# Patient Record
Sex: Female | Born: 1945 | Hispanic: No | Marital: Single | Smoking: Former smoker
Health system: Southern US, Community
[De-identification: ages and names within clinical notes are randomized; demographics above are authoritative.]

## PROBLEM LIST (undated history)

## (undated) DIAGNOSIS — E785 Hyperlipidemia, unspecified: Secondary | ICD-10-CM

## (undated) DIAGNOSIS — H04129 Dry eye syndrome of unspecified lacrimal gland: Secondary | ICD-10-CM

## (undated) DIAGNOSIS — M1990 Unspecified osteoarthritis, unspecified site: Secondary | ICD-10-CM

## (undated) DIAGNOSIS — R011 Cardiac murmur, unspecified: Secondary | ICD-10-CM

## (undated) DIAGNOSIS — T7840XA Allergy, unspecified, initial encounter: Secondary | ICD-10-CM

## (undated) DIAGNOSIS — M8580 Other specified disorders of bone density and structure, unspecified site: Secondary | ICD-10-CM

## (undated) DIAGNOSIS — I10 Essential (primary) hypertension: Secondary | ICD-10-CM

## (undated) DIAGNOSIS — B029 Zoster without complications: Secondary | ICD-10-CM

## (undated) HISTORY — PX: BREAST EXCISIONAL BIOPSY: SUR124

## (undated) HISTORY — DX: Hyperlipidemia, unspecified: E78.5

## (undated) HISTORY — DX: Unspecified osteoarthritis, unspecified site: M19.90

## (undated) HISTORY — DX: Dry eye syndrome of unspecified lacrimal gland: H04.129

## (undated) HISTORY — DX: Cardiac murmur, unspecified: R01.1

## (undated) HISTORY — DX: Allergy, unspecified, initial encounter: T78.40XA

## (undated) HISTORY — DX: Other specified disorders of bone density and structure, unspecified site: M85.80

## (undated) HISTORY — DX: Essential (primary) hypertension: I10

## (undated) HISTORY — PX: ABDOMINAL HYSTERECTOMY: SHX81

## (undated) HISTORY — DX: Zoster without complications: B02.9

---

## 2003-08-17 LAB — HM COLONOSCOPY: HM Colonoscopy: NORMAL

## 2004-02-18 ENCOUNTER — Encounter: Payer: Self-pay | Admitting: Internal Medicine

## 2006-12-13 LAB — CONVERTED CEMR LAB
AST: 20 units/L
CO2, serum: 27 mmol/L
Calcium: 10.1 mg/dL
Cholesterol: 238 mg/dL
Creatinine, Ser: 0.8 mg/dL
Glucose, Bld: 74 mg/dL
HDL: 46 mg/dL
LDL Cholesterol: 168 mg/dL
Triglycerides: 121 mg/dL

## 2007-03-28 LAB — CONVERTED CEMR LAB
AST: 20 units/L
CO2, serum: 25 mmol/L
HDL: 44 mg/dL
Potassium, serum: 4.4 mmol/L
RBC count: 4.35 10*6/uL
WBC, blood: 6.6 10*3/uL

## 2007-07-26 ENCOUNTER — Encounter: Payer: Self-pay | Admitting: Internal Medicine

## 2008-01-27 LAB — CONVERTED CEMR LAB
B-12, serum: 1245 pg/mL
CO2, serum: 27 mmol/L
Chloride, Serum: 107 mmol/L
Cholesterol: 184 mg/dL
HDL: 46 mg/dL
Hemoglobin: 13.1 g/dL
LDL Cholesterol: 117 mg/dL
Platelets: 227 10*3/uL
Potassium, serum: 4.2 mmol/L
Sodium, serum: 144 mmol/L
Total Bilirubin: 0.6 mg/dL
Total Protein: 7.4 g/dL
Uric Acid, Serum: 4.6 mg/dL

## 2008-03-19 ENCOUNTER — Encounter: Payer: Self-pay | Admitting: Internal Medicine

## 2008-05-01 ENCOUNTER — Encounter (INDEPENDENT_AMBULATORY_CARE_PROVIDER_SITE_OTHER): Payer: Self-pay | Admitting: *Deleted

## 2008-06-18 ENCOUNTER — Ambulatory Visit: Payer: Self-pay | Admitting: Internal Medicine

## 2008-06-18 DIAGNOSIS — F17200 Nicotine dependence, unspecified, uncomplicated: Secondary | ICD-10-CM | POA: Insufficient documentation

## 2008-06-18 DIAGNOSIS — I10 Essential (primary) hypertension: Secondary | ICD-10-CM | POA: Insufficient documentation

## 2008-06-18 DIAGNOSIS — E785 Hyperlipidemia, unspecified: Secondary | ICD-10-CM | POA: Insufficient documentation

## 2008-06-18 DIAGNOSIS — M8580 Other specified disorders of bone density and structure, unspecified site: Secondary | ICD-10-CM

## 2008-09-26 ENCOUNTER — Ambulatory Visit: Payer: Self-pay | Admitting: Internal Medicine

## 2008-09-26 DIAGNOSIS — R040 Epistaxis: Secondary | ICD-10-CM

## 2008-10-01 ENCOUNTER — Encounter: Payer: Self-pay | Admitting: Internal Medicine

## 2008-10-14 DIAGNOSIS — B029 Zoster without complications: Secondary | ICD-10-CM

## 2008-10-14 HISTORY — DX: Zoster without complications: B02.9

## 2008-11-19 ENCOUNTER — Ambulatory Visit: Payer: Self-pay | Admitting: Internal Medicine

## 2008-11-21 ENCOUNTER — Telehealth (INDEPENDENT_AMBULATORY_CARE_PROVIDER_SITE_OTHER): Payer: Self-pay | Admitting: *Deleted

## 2008-11-21 LAB — CONVERTED CEMR LAB
AST: 22 units/L (ref 0–37)
Basophils Relative: 0.2 % (ref 0.0–3.0)
CO2: 30 meq/L (ref 19–32)
Cholesterol: 239 mg/dL — ABNORMAL HIGH (ref 0–200)
Creatinine, Ser: 0.6 mg/dL (ref 0.4–1.2)
HCT: 40.6 % (ref 36.0–46.0)
HDL: 41.3 mg/dL (ref >39.00)
Hemoglobin: 13.8 g/dL (ref 12.0–15.0)
Lymphocytes Relative: 29.4 % (ref 12.0–46.0)
MCHC: 33.9 g/dL (ref 30.0–36.0)
MCV: 90.9 fL (ref 78.0–100.0)
Monocytes Absolute: 0.3 10*3/uL (ref 0.1–1.0)
Neutro Abs: 2.6 10*3/uL (ref 1.4–7.7)
Neutrophils Relative %: 61.3 % (ref 43.0–77.0)
TSH: 0.56 microintl units/mL (ref 0.35–5.50)
Total CHOL/HDL Ratio: 6
Triglycerides: 107 mg/dL (ref 0.0–149.0)
VLDL: 21.4 mg/dL (ref 0.0–40.0)
WBC: 4.2 10*3/uL — ABNORMAL LOW (ref 4.5–10.5)

## 2008-11-26 ENCOUNTER — Encounter: Admission: RE | Admit: 2008-11-26 | Discharge: 2008-11-26 | Payer: Self-pay | Admitting: Internal Medicine

## 2008-12-02 ENCOUNTER — Encounter (INDEPENDENT_AMBULATORY_CARE_PROVIDER_SITE_OTHER): Payer: Self-pay | Admitting: *Deleted

## 2008-12-06 ENCOUNTER — Ambulatory Visit: Payer: Self-pay | Admitting: Gastroenterology

## 2008-12-09 ENCOUNTER — Telehealth: Payer: Self-pay | Admitting: Gastroenterology

## 2008-12-10 ENCOUNTER — Ambulatory Visit: Payer: Self-pay | Admitting: Internal Medicine

## 2008-12-16 ENCOUNTER — Telehealth: Payer: Self-pay | Admitting: Internal Medicine

## 2008-12-17 ENCOUNTER — Telehealth: Payer: Self-pay | Admitting: Gastroenterology

## 2008-12-20 ENCOUNTER — Ambulatory Visit: Payer: Self-pay | Admitting: Gastroenterology

## 2008-12-20 LAB — HM COLONOSCOPY

## 2009-04-07 ENCOUNTER — Encounter: Payer: Self-pay | Admitting: Internal Medicine

## 2009-06-10 ENCOUNTER — Ambulatory Visit: Payer: Self-pay | Admitting: Internal Medicine

## 2009-06-10 DIAGNOSIS — J309 Allergic rhinitis, unspecified: Secondary | ICD-10-CM | POA: Insufficient documentation

## 2009-06-10 DIAGNOSIS — F411 Generalized anxiety disorder: Secondary | ICD-10-CM | POA: Insufficient documentation

## 2009-11-03 ENCOUNTER — Emergency Department (HOSPITAL_COMMUNITY): Admission: EM | Admit: 2009-11-03 | Discharge: 2009-11-03 | Payer: Self-pay | Admitting: Emergency Medicine

## 2009-11-06 ENCOUNTER — Ambulatory Visit: Payer: Self-pay | Admitting: Internal Medicine

## 2009-11-11 ENCOUNTER — Ambulatory Visit: Payer: Self-pay | Admitting: Internal Medicine

## 2009-12-26 ENCOUNTER — Encounter: Admission: RE | Admit: 2009-12-26 | Discharge: 2009-12-26 | Payer: Self-pay | Admitting: Internal Medicine

## 2010-02-10 ENCOUNTER — Ambulatory Visit: Payer: Self-pay | Admitting: Internal Medicine

## 2010-02-12 ENCOUNTER — Ambulatory Visit: Payer: Self-pay | Admitting: Internal Medicine

## 2010-02-17 LAB — CONVERTED CEMR LAB
ALT: 19 units/L (ref 0–35)
BUN: 15 mg/dL (ref 6–23)
Basophils Absolute: 0 10*3/uL (ref 0.0–0.1)
Basophils Relative: 0.6 % (ref 0.0–3.0)
Calcium: 9.2 mg/dL (ref 8.4–10.5)
Chloride: 104 meq/L (ref 96–112)
Creatinine, Ser: 0.7 mg/dL (ref 0.4–1.2)
Direct LDL: 128.6 mg/dL
Glucose, Bld: 82 mg/dL (ref 70–99)
HCT: 37.1 % (ref 36.0–46.0)
MCHC: 33.7 g/dL (ref 30.0–36.0)
Neutro Abs: 2.5 10*3/uL (ref 1.4–7.7)
RBC: 4.1 M/uL (ref 3.87–5.11)
TSH: 1.24 microintl units/mL (ref 0.35–5.50)
Total CHOL/HDL Ratio: 4
Total CK: 185 units/L — ABNORMAL HIGH (ref 7–177)
Triglycerides: 125 mg/dL (ref 0.0–149.0)
WBC: 4 10*3/uL — ABNORMAL LOW (ref 4.5–10.5)

## 2010-02-23 ENCOUNTER — Telehealth: Payer: Self-pay | Admitting: Internal Medicine

## 2010-03-05 ENCOUNTER — Encounter: Admission: RE | Admit: 2010-03-05 | Discharge: 2010-03-05 | Payer: Self-pay | Admitting: Internal Medicine

## 2010-03-05 ENCOUNTER — Encounter: Payer: Self-pay | Admitting: Internal Medicine

## 2010-03-25 ENCOUNTER — Ambulatory Visit: Payer: Self-pay | Admitting: Internal Medicine

## 2010-03-25 DIAGNOSIS — M79609 Pain in unspecified limb: Secondary | ICD-10-CM

## 2010-05-11 ENCOUNTER — Telehealth: Payer: Self-pay | Admitting: Internal Medicine

## 2010-05-12 ENCOUNTER — Encounter: Payer: Self-pay | Admitting: Internal Medicine

## 2010-05-27 ENCOUNTER — Telehealth: Payer: Self-pay | Admitting: Internal Medicine

## 2010-08-14 ENCOUNTER — Ambulatory Visit: Payer: Self-pay | Admitting: Internal Medicine

## 2010-09-07 ENCOUNTER — Encounter: Payer: Self-pay | Admitting: Internal Medicine

## 2010-09-15 NOTE — Progress Notes (Signed)
Summary: ORTHOPAEDIC REFERRAL  Phone Note Other Incoming   Summary of Call: IN REFERENCE TO ORTHOPAEDIC REFERRAL.........Marland KitchenPER FAX FROM [facility], THEY CONTACTED PATIENT ON 05-19-2010 & PATIENT STATED SHE IS FEELING BETTER & DOES NOT WISH TO SCH'D AN APPT AT THIS TIME FOR ARM PAIN Magdalen Spatz [facility]  May 27, 2010 11:11 AM

## 2010-09-15 NOTE — Progress Notes (Signed)
Summary: muscle pain  Phone Note Call from Patient   Summary of Call: Patient left message on triage the she has been on/off Zocor and still has muscle pain, it does not appear to be the cause. Patient notes that it is not as severe as previously. Any recommendations? Initial call taken by: Lucious Groves CMA,  May 11, 2010 1:28 PM  Follow-up for Phone Call        per my OV note will refer to ortho, please arrange. Restart zocor Follow-up by: Nolon Rod. Paz MD,  May 12, 2010 11:48 AM  Additional Follow-up for Phone Call Additional follow up Details #1::        Patient notified. Additional Follow-up by: Lucious Groves CMA,  May 12, 2010 12:35 PM

## 2010-09-15 NOTE — Progress Notes (Signed)
Summary: Lab Work ?  Phone Note Call from Patient Call back at Home Phone [phone]   Caller: Patient Summary of Call: Patient called and left a message on the triage line asking about her "muscle test". Will call patient back to find out more information.  Initial call taken by: Harold Barban,  February 23, 2010 10:31 AM  Follow-up for Phone Call        Spoke with patient and she wanted to know if that number meant she had to much muscle or not enough. She also wanted to ask about supplements because Ensure makes a healthy muscle drink, she wants to know if that would help her or harm her. She also said she made her appt for her BMD. I will fax over the order.  Follow-up by: Harold Barban,  February 23, 2010 10:33 AM  Additional Follow-up for Phone Call Additional follow up Details #1::        at CK was done to see if she has any muscle damage from the medicines that she is on. The CK is only barely elevated. I think it is fine.  If anything, we may need to check that from time to time Kingsley E. Advith Martine MD  February 23, 2010 11:05 AM     Additional Follow-up for Phone Call Additional follow up Details #2::    Patient is aware of information and will call if she has any more concerns.  Follow-up by: Harold Barban,  February 23, 2010 11:32 AM

## 2010-09-15 NOTE — Letter (Signed)
Summary: Patient Does Not Want to Schedule/[facility]  Patient Does Not Want to Schedule/[facility]   Imported By: Lanelle Bal 06/03/2010 15:55:29  _____________________________________________________________________  External Attachment:    Type:   Image     Comment:   External Document

## 2010-09-15 NOTE — Assessment & Plan Note (Signed)
Summary: muscle pain/cbs   Vital Signs:  Patient profile:   65 year old female Weight:      155.38 pounds Pulse rate:   69 / minute Pulse rhythm:   regular BP sitting:   132 / 84  (left arm) Cuff size:   regular  Vitals Entered By: Army Fossa CMA (March 25, 2010 12:55 PM) CC: C/o muscle pains Comments - in her arms - since the end of June   History of Present Illness: continue  with pain in the upper extremities See last office visit... Pain used to be  bilateral, now the left arm stopped  hurting. Current pain is distal from the right elbow and some in the right triceps  ROS No fever No headaches or neck pain No weight loss in fact has gained some weight (quit tobacco a few weeks ago) No other aches  Current Medications (verified): 1)  Norvasc 10 Mg Tabs (Amlodipine Besylate) .Marland Kitchen.. 1 By Mouth Once Daily 2)  Hydrochlorothiazide 25 Mg Tabs (Hydrochlorothiazide) .Marland Kitchen.. 1 By Mouth Once Daily 3)  Vasotec 20 Mg Tabs (Enalapril Maleate) .Marland Kitchen.. 1 By Mouth Once Daily 4)  Atenolol 50 Mg Tabs (Atenolol) .Marland Kitchen.. 1 By Mouth Once Daily 5)  Zocor 40 Mg Tabs (Simvastatin) .Marland Kitchen.. 1 By Mouth Once Daily 6)  Adult Aspirin Ec Low Strength 81 Mg Tbec (Aspirin) .Marland Kitchen.. 1  A Day 7)  Caltrate/d 8)  Multivitamin  Allergies (verified): No Known Drug Allergies  Past History:  Past Medical History: Reviewed history from 02/10/2010 and no changes required. Hyperlipidemia--Dx years ago Hypertension--Dx years ago G1P1 Osteopenia, per patient had a DEXA few years ago Allergic rhinitis shingles 3-10  Past Surgical History: Reviewed history from 06/18/2008 and no changes required. Lumpectomy (fibrocystic) Hysterectomy, Oophorectomy--remotely  Social History: Divorced 1 daughter moved to [place] from [place] 11-09 Retired Tree surgeon tobacco-- still smoking , cutting down 1/6 ppd."almost not smoking" ETOH-- no diet-- ok except for icecream exercise-- goes to the gym   Physical  Exam  General:  alert, well-developed, and well-nourished.   Neck:  full ROM.  no tender to palpation in the cervical spine Extremities:  no lower extremity pitting edema Left arm trauma Right arm no bar to inspection palpation, elbow for range of motion, wrist without puffiness. Neurologic:  alert & oriented X3, strength normal in all extremities, gait normal, and DTRs symmetrical and normal.     Impression & Recommendations:  Problem # 1:  ARM PAIN (ICD-729.5)  a few weeks ago had bilateral upper extremity pain, now pain is more concentrated on the right arm, distal from the elbow. She is on statins, last CT is slightly elevated Plan: Hold  statins for 3 weeks Recheck CK and a sedimentation rate patient will call in 3 weeks, if not better, will be referred to orthopedic surgery  Orders: Venipuncture (35573) TLB-Sedimentation Rate (ESR) (85652-ESR) TLB-CK Total Only(Creatine Kinase/CPK) (82550-CK) Specimen Handling (22025)  Complete Medication List: 1)  Norvasc 10 Mg Tabs (Amlodipine besylate) .Marland Kitchen.. 1 by mouth once daily 2)  Hydrochlorothiazide 25 Mg Tabs (Hydrochlorothiazide) .Marland Kitchen.. 1 by mouth once daily 3)  Vasotec 20 Mg Tabs (Enalapril maleate) .Marland Kitchen.. 1 by mouth once daily 4)  Atenolol 50 Mg Tabs (Atenolol) .Marland Kitchen.. 1 by mouth once daily 5)  Zocor 40 Mg Tabs (Simvastatin) .Marland Kitchen.. 1 by mouth once daily 6)  Adult Aspirin Ec Low Strength 81 Mg Tbec (Aspirin) .Marland Kitchen.. 1  a day 7)  Caltrate/d  8)  Multivitamin   Patient Instructions: 1)  hold  simvastatin for 2 weeks, then restart 2)  Call me if you are not better by next month 3)  Take 400  mg of Ibuprofen (Advil, Motrin) with food every  6 hours as needed  for relief of pain; watch for stomach irritation or pain

## 2010-09-15 NOTE — Assessment & Plan Note (Signed)
Summary: followup from ED/alr   Vital Signs:  Patient profile:   65 year old female Height:      66.5 inches Weight:      149 pounds BMI:     23.77 Pulse rate:   68 / minute BP sitting:   140 / 82  Vitals Entered By: Shary Decamp (November 11, 2009 10:48 AM) CC: rov, feeling better,  pt decided not to take citalopram   History of Present Illness: sudden onset of a painful rash about 10 days ago (tiny pinheads w/ water ) went to the ER they diagnosed her with shingles was prescribed Valtrex and prednisone treatment was complicated by hand swelling that is now resolving she is now off Valtrex and prednisone  Current Medications (verified): 1)  Norvasc 10 Mg Tabs (Amlodipine Besylate) .Marland Kitchen.. 1 By Mouth Once Daily 2)  Hydrochlorothiazide 25 Mg Tabs (Hydrochlorothiazide) .Marland Kitchen.. 1 By Mouth Once Daily 3)  Vasotec 20 Mg Tabs (Enalapril Maleate) .Marland Kitchen.. 1 By Mouth Once Daily 4)  Atenolol 50 Mg Tabs (Atenolol) .Marland Kitchen.. 1 By Mouth Once Daily 5)  Zocor 40 Mg Tabs (Simvastatin) .Marland Kitchen.. 1 By Mouth Once Daily 6)  Adult Aspirin Ec Low Strength 81 Mg Tbec (Aspirin) .Marland Kitchen.. 1  A Day 7)  Caltrate/d 8)  Multivitamin 9)  Claritin 10 Mg Tabs (Loratadine) .... One By Mouth At Bedtime For Allergies 10)  Valacyclovir Hcl 1 Gm Tabs (Valacyclovir Hcl) .... Three Times A Day  Allergies (verified): No Known Drug Allergies  Past History:  Past Medical History: Reviewed history from 06/10/2009 and no changes required. Hyperlipidemia--Dx years ago Hypertension--Dx years ago G1P1 Osteopenia, per patient had a DEXA few years ago Allergic rhinitis  Past Surgical History: Reviewed history from 06/18/2008 and no changes required. Lumpectomy (fibrocystic) Hysterectomy, Oophorectomy--remotely  Social History: Reviewed history from 06/10/2009 and no changes required. 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  Prednisone? the  swelling in the hands is getting better---->  observation discussed with the patient what shingles is, also discussed with her postherpetic neuralgia, she is to call me if she develops persistent  pain in the area  Problem # 2:  ANXIETY STATE, UNSPECIFIED (ICD-300.00) she was quite anxious the last time she was seen here by my partner, she is better now, she decided not to take citalopram The following medications were removed from the medication list:    Citalopram Hydrobromide 20 Mg Tabs (Citalopram hydrobromide) .Marland Kitchen... 1 once daily  Complete Medication List: 1)  Norvasc 10 Mg Tabs (Amlodipine besylate) .Marland Kitchen.. 1 by mouth once daily 2)  Hydrochlorothiazide 25 Mg Tabs (Hydrochlorothiazide) .Marland Kitchen.. 1 by mouth once daily 3)  Vasotec 20 Mg Tabs (Enalapril maleate) .Marland Kitchen.. 1 by mouth once daily 4)  Atenolol 50 Mg Tabs (Atenolol) .Marland Kitchen.. 1 by mouth once daily 5)  Zocor 40 Mg Tabs (Simvastatin) .Marland Kitchen.. 1 by mouth once daily 6)  Adult Aspirin Ec Low Strength 81 Mg Tbec (Aspirin) .Marland Kitchen.. 1  a day 7)  Caltrate/d  8)  Multivitamin  9)  Claritin 10 Mg Tabs (Loratadine) .... One by mouth at bedtime for allergies  Patient Instructions: 1)  Please schedule a follow-up appointment in 3 months .  Prescriptions: ZOCOR 40 MG TABS (SIMVASTATIN) 1 by mouth once daily  #30 x 6   Entered and Authorized by:   Elita Quick E. Llana Deshazo MD   Signed by:   Nolon Rod. Courage Biglow MD on 11/11/2009   Method used:   Print then Mail to Patient   RxID:   [id number] ATENOLOL 50 MG TABS (ATENOLOL) 1 by mouth once daily  #30 x 6   Entered and Authorized by:   Nolon Rod. Elian Gloster MD   Signed by:   Nolon Rod.  Thien Berka MD on 11/11/2009   Method used:   Print then Mail to Patient   RxID:   [id number] VASOTEC 20 MG TABS (ENALAPRIL MALEATE) 1 by mouth once daily  #30 x 6   Entered and Authorized by:   Nolon Rod. Fredrika Canby MD   Signed by:   Nolon Rod. Aiden Rao MD on 11/11/2009   Method used:   Print then Mail to Patient   RxID:   [id number] HYDROCHLOROTHIAZIDE 25 MG TABS (HYDROCHLOROTHIAZIDE) 1 by mouth once daily  #90 x 6   Entered and Authorized by:   Nolon Rod. Ryliegh Mcduffey MD   Signed by:   Nolon Rod. Amarilis Belflower MD on 11/11/2009   Method used:   Print then Mail to Patient   RxID:   [id number] NORVASC 10 MG TABS (AMLODIPINE BESYLATE) 1 by mouth once daily  #30 x 6   Entered and Authorized by:   Nolon Rod. Rolfe Hartsell MD   Signed by:   Nolon Rod. Tinea Nobile MD on 11/11/2009   Method used:   Print then Mail to Patient   RxID:   [id number]

## 2010-09-15 NOTE — Assessment & Plan Note (Signed)
Summary: hands red swollen/alr   Vital Signs:  Patient profile:   65 year old female Weight:      131 pounds Temp:     98.5 degrees F oral Pulse rate:   58 / minute Resp:     17 per minute BP sitting:   134 / 78  (left arm) Cuff size:   large  Vitals Entered By: Shonna Chock (November 06, 2009 2:29 PM) CC: Patient started prednisone in the ER-Sunday (Dx with shingles)and now thinks she is having a reaction(Hands swollen and red) since this morning. Comments REVIEWED MED LIST, PATIENT AGREED DOSE AND INSTRUCTION CORRECT    CC:  Patient started prednisone in the ER-Sunday (Dx with shingles)and now thinks she is having a reaction(Hands swollen and red) since this morning.Marland Kitchen  History of Present Illness: L chest rash with soreness as of 11/02/2009; it progressed prompting ER visit that night.Valacyclovir & Prednisone Rxed  for zoster. Palms red this am upon awakening, ? from steroids. She is concerned about NHL  ,which her mother had.  Allergies (verified): No Known Drug Allergies  Review of Systems General:  Denies chills, fever, sweats, and weight loss. Resp:  Denies shortness of breath and wheezing. Derm:  Complains of itching and rash; Burning has resolved ,some Itching . Psych:  Complains of anxiety; denies easily tearful.  Physical Exam  General:  well-nourished,in no acute distress; alert,appropriate and cooperative throughout examination Skin:  Classic C5 zoster L neck & chest  Cervical Nodes:  No lymphadenopathy noted Axillary Nodes:  No palpable lymphadenopathy Psych:  memory intact for recent and remote and slightly anxious.     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 1 once daily  Complete Medication List: 1)  Norvasc 10 Mg Tabs (Amlodipine besylate) .Marland Kitchen..  1 by mouth once daily 2)  Hydrochlorothiazide 25 Mg Tabs (Hydrochlorothiazide) .Marland Kitchen.. 1 by mouth once daily 3)  Vasotec 20 Mg Tabs (Enalapril maleate) .Marland Kitchen.. 1 by mouth once daily 4)  Atenolol 50 Mg Tabs (Atenolol) .Marland Kitchen.. 1 by mouth once daily 5)  Zocor 40 Mg Tabs (Simvastatin) .Marland Kitchen.. 1 by mouth once daily 6)  Adult Aspirin Ec Low Strength 81 Mg Tbec (Aspirin) .Marland Kitchen.. 1  a day 7)  Caltrate/d  8)  Multivitamin  9)  Claritin 10 Mg Tabs (Loratadine) .... One by mouth at bedtime for allergies 10)  Valacyclovir Hcl 1 Gm Tabs (Valacyclovir hcl) .... Three times a day 11)  Citalopram Hydrobromide 20 Mg Tabs (Citalopram hydrobromide) .Marland Kitchen.. 1 once daily  Patient Instructions: 1)  Consider Citalopram to raise Seratonin level as directed . Stop Prednisone; complete Valacyclovir Prescriptions: CITALOPRAM HYDROBROMIDE 20 MG TABS (CITALOPRAM HYDROBROMIDE) 1 once daily  #30 x 5   Entered and Authorized by:   Marga Melnick MD   Signed by:   Marga Melnick MD on 11/06/2009   Method used:   Print then Give to Patient   RxID:   [id number]

## 2010-09-15 NOTE — Assessment & Plan Note (Signed)
Summary: CPX  fu/kdc   Vital Signs:  Patient profile:   65 year old female Weight:      148.25 pounds Pulse rate:   64 / minute Pulse rhythm:   regular BP sitting:   146 / 88  (left arm) Cuff size:   regular  Vitals Entered By: Army Fossa CMA (February 10, 2010 12:35 PM) CC: Follow up visit.  Comments Noticied some soreness in her elbows and knees.    History of Present Illness: CPX Hyperlipidemia--   medication compliance not 100% Hypertension-ambulatory BPs in the 120/70 range,consistently Osteoarthritis? Had bilateral knee pain and some swelling for a few days, symptoms subsided Having aches and pains in the upper extremities ( biceps triceps) and elbows, wonders if related to holding onto the treadmill when she goes to exercise   Allergies (verified): No Known Drug Allergies  Past History:  Past Medical History: Hyperlipidemia--Dx years ago Hypertension--Dx years ago G1P1 Osteopenia, per patient had a DEXA few years ago Allergic rhinitis shingles 3-10  Past Surgical History: Reviewed history from 06/18/2008 and no changes required. Lumpectomy (fibrocystic) Hysterectomy, Oophorectomy--remotely  Family History: Reviewed history from 06/18/2008 and no changes required. CAD - M ? 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 CV:  Denies chest pain or discomfort and swelling of feet. Resp:  Denies cough and shortness of breath. GI:  Denies bloody stools, nausea, and vomiting. GU:  does not do  SBE no vag d/c or bleeding . Neuro:  Denies headaches.  Physical Exam  General:   alert, well-developed, and well-nourished.   Neck:  no masses, no thyromegaly, and normal carotid upstroke.   Full range of motion Breasts:  No mass, nodules, thickening, tenderness, bulging, retraction, inflamation, nipple discharge or skin changes noted.  no axillary lymph nodes Lungs:  normal respiratory effort, no intercostal retractions, no accessory muscle use, and normal breath sounds.   Heart:  normal rate, regular rhythm, no murmur, and no gallop.   Abdomen:  soft, non-tender, no distention, no masses, no guarding, and no rigidity.   Msk:  not tender to palpation at the upper spine Extremities:  no lower extremity edema Inspection and palpation of the knees and elbows normal Palpation of the biceps and triceps showed no particular dyscomfort Psych:  Cognition and judgment appear intact. Alert and cooperative with normal attention span and concentration. not anxious appearing and not depressed appearing.     Impression & Recommendations:  Problem # 1:  HEALTH SCREENING (ICD-V70.0) Td 4-10 pneumonia shot declined again this year (offered b/c she is a smoker) had shingles 3-11  told by her gyn that doesn't need further PAPs   mammogram 5/11 negative Breast exam normal today Encourage self breast exam   colonoscopy per patient aprox 2004, again 5-10, neg, next 10 years    counseled about tobacco   Problem # 2:  OSTEOPENIA (ICD-733.90)  ordering a bone density test Vitamin D  Orders: Radiology Referral (Radiology)  Problem # 3:  HYPERTENSION (ICD-401.9) BP today slightly high, good ambulatory BPs. No change Her updated medication list for this problem  includes:    Norvasc 10 Mg Tabs (Amlodipine besylate) .Marland Kitchen... 1 by mouth once daily    Hydrochlorothiazide 25 Mg Tabs (Hydrochlorothiazide) .Marland Kitchen... 1 by mouth once daily    Vasotec 20 Mg Tabs (Enalapril maleate) .Marland Kitchen... 1 by mouth once daily    Atenolol 50 Mg Tabs (Atenolol) .Marland Kitchen... 1 by mouth once daily  BP today:  146/88 Prior BP: 140/82 (11/11/2009)  Labs Reviewed: K+: 4.0 (11/19/2008) Creat: : 0.6 (11/19/2008)   Chol: 209 (06/10/2009)   HDL: 41.30 (06/10/2009)   LDL: 117 (01/27/2008)   TG: 127.0 (06/10/2009)  Problem # 4:  HYPERLIPIDEMIA (ICD-272.4) having some aches and pains in the upper extremity,check CK Knee pain likely unrelated to cholesterol medicine Her updated medication list for this problem includes:    Zocor 40 Mg Tabs (Simvastatin) .Marland Kitchen... 1 by mouth once daily  Labs Reviewed: SGOT: 24 (06/10/2009)   SGPT: 20 (06/10/2009)   HDL:41.30 (06/10/2009), 41.30 (11/19/2008)  LDL:117 (01/27/2008), 136 (03/28/2007)  Chol:209 (06/10/2009), 239 (11/19/2008)  Trig:127.0 (06/10/2009), 107.0 (11/19/2008)  Complete Medication List: 1)  Norvasc 10 Mg Tabs (Amlodipine besylate) .Marland Kitchen.. 1 by mouth once daily 2)  Hydrochlorothiazide 25 Mg Tabs (Hydrochlorothiazide) .Marland Kitchen.. 1 by mouth once daily 3)  Vasotec 20 Mg Tabs (Enalapril maleate) .Marland Kitchen.. 1 by mouth once daily 4)  Atenolol 50 Mg Tabs (Atenolol) .Marland Kitchen.. 1 by mouth once daily 5)  Zocor 40 Mg Tabs (Simvastatin) .Marland Kitchen.. 1 by mouth once daily 6)  Adult Aspirin Ec Low Strength 81 Mg Tbec (Aspirin) .Marland Kitchen.. 1  a day 7)  Caltrate/d  8)  Multivitamin  9)  Claritin 10 Mg Tabs (Loratadine) .... One by mouth at bedtime for allergies  Patient Instructions: 1)  come back fasting 2)  BMP, CBC, TSH, AST, ALT, FLP  , vitamin D -----dx  V70 3)  Total CK----dx  high cholesterol 4)  Please schedule a follow-up appointment in 6 months .

## 2010-09-17 NOTE — Assessment & Plan Note (Signed)
Summary: rto 6 month/cbs   Vital Signs:  Patient profile:   65 year old female Height:      66.5 inches Weight:      161 pounds BMI:     25.69 Pulse rate:   62 / minute Pulse rhythm:   regular BP sitting:   128 / 84  (left arm) Cuff size:   regular  Vitals Entered By: Army Fossa CMA (August 14, 2010 10:21 AM) CC: Follow up on BP- fasting  Comments wants rx's printed discuss zocor- giving her arm pain    History of Present Illness: ROV had  arm pain  that decreased when she held zocor; symptoms returned when she restarted it  HTN---ambulatory BPs 111/70s     Current Medications (verified): 1)  Norvasc 10 Mg Tabs (Amlodipine Besylate) .Marland Kitchen.. 1 By Mouth Once Daily 2)  Hydrochlorothiazide 25 Mg Tabs (Hydrochlorothiazide) .Marland Kitchen.. 1 By Mouth Once Daily 3)  Vasotec 20 Mg Tabs (Enalapril Maleate) .Marland Kitchen.. 1 By Mouth Once Daily 4)  Atenolol 50 Mg Tabs (Atenolol) .Marland Kitchen.. 1 By Mouth Once Daily 5)  Zocor 40 Mg Tabs (Simvastatin) .Marland Kitchen.. 1 By Mouth Once Daily 6)  Adult Aspirin Ec Low Strength 81 Mg Tbec (Aspirin) .Marland Kitchen.. 1  A Day 7)  Caltrate/d 8)  Multivitamin  Allergies (verified): No Known Drug Allergies  Past History:  Past Medical History: Reviewed history from 02/10/2010 and no changes required. Hyperlipidemia--Dx years ago Hypertension--Dx years ago G1P1 Osteopenia, per patient had a DEXA few years ago Allergic rhinitis shingles 3-10  Past Surgical History: Reviewed history from 06/18/2008 and no changes required. Lumpectomy (fibrocystic) Hysterectomy, Oophorectomy--remotely  Social History: Reviewed history from 03/25/2010 and no changes required. 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 Resp:  Denies cough and shortness of breath.  Physical Exam  General:  alert, well-developed, and well-nourished.   Lungs:  normal respiratory effort, no intercostal retractions, no accessory muscle use, and normal breath sounds.   Heart:  normal rate, regular rhythm, no murmur, and no gallop.   Extremities:  no lower extremity pitting edema     Impression & Recommendations:  Problem # 1:  HYPERTENSION (ICD-401.9) at goal  Her updated medication list for this problem includes:    Norvasc 10 Mg Tabs (Amlodipine besylate) .Marland Kitchen... 1 by mouth once daily    Hydrochlorothiazide 25 Mg Tabs (Hydrochlorothiazide) .Marland Kitchen... 1 by mouth once daily    Vasotec 20 Mg Tabs (Enalapril maleate) .Marland Kitchen... 1 by mouth once daily    Atenolol 50 Mg Tabs (Atenolol) .Marland Kitchen... 1 by mouth once daily  BP today: 128/84 Prior BP: 132/84 (03/25/2010)  Labs Reviewed: K+: 4.4 (02/12/2010) Creat: : 0.7 (02/12/2010)   Chol: 204 (02/12/2010)   HDL: 47.00 (02/12/2010)   LDL: 117 (01/27/2008)   TG: 125.0 (02/12/2010)  Problem # 2:  HYPERLIPIDEMIA (ICD-272.4) see HPI  ----> Zocor causing upper extremity pain. trial w/ pravachol   The following medications were removed from the medication list:    Zocor 40 Mg Tabs (Simvastatin) .Marland Kitchen... 1 by mouth once daily Her updated medication list for this problem includes:    Pravachol 40  Mg Tabs (Pravastatin sodium) .Marland Kitchen... 1 by mouth once daily  Labs Reviewed: SGOT: 21 (02/12/2010)   SGPT: 19 (02/12/2010)   HDL:47.00 (02/12/2010), 41.30 (06/10/2009)  LDL:117 (01/27/2008), 136 (03/28/2007)  Chol:204 (02/12/2010), 209 (06/10/2009)  Trig:125.0 (02/12/2010), 127.0 (06/10/2009)  Problem # 3:  ARM PAIN (ICD-729.5) likely d/t statins, see HPI  Complete Medication List: 1)  Norvasc 10 Mg Tabs (Amlodipine besylate) .Marland Kitchen.. 1 by mouth once daily 2)  Hydrochlorothiazide 25 Mg Tabs (Hydrochlorothiazide) .Marland Kitchen.. 1 by mouth once daily 3)   Vasotec 20 Mg Tabs (Enalapril maleate) .Marland Kitchen.. 1 by mouth once daily 4)  Atenolol 50 Mg Tabs (Atenolol) .Marland Kitchen.. 1 by mouth once daily 5)  Adult Aspirin Ec Low Strength 81 Mg Tbec (Aspirin) .Marland Kitchen.. 1  a day 6)  Caltrate/d  7)  Multivitamin  8)  Pravachol 40 Mg Tabs (Pravastatin sodium) .Marland Kitchen.. 1 by mouth once daily  Patient Instructions: 1)  start Pravachol 2)  Continue with your exercise routine 3)  Watch her diet 4)  Come back in 3 months for fasting  labs only: 5)  FLP AST ALT ---dx high chol  6)  Please schedule a follow-up appointment in 6 months , fasting, physical exam Prescriptions: PRAVACHOL 40 MG TABS (PRAVASTATIN SODIUM) 1 by mouth once daily  #30 x 6   Entered and Authorized by:   Elita Quick E. Maurina Fawaz MD   Signed by:   Nolon Rod. Zemirah Krasinski MD on 08/14/2010   Method used:   Print then Give to Patient   RxID:   [id number] ATENOLOL 50 MG TABS (ATENOLOL) 1 by mouth once daily  #90 x 3   Entered and Authorized by:   Nolon Rod. Shylynn Bruning MD   Signed by:   Nolon Rod. Jalin Alicea MD on 08/14/2010   Method used:   Print then Give to Patient   RxID:   [id number] VASOTEC 20 MG TABS (ENALAPRIL MALEATE) 1 by mouth once daily  #90 x 3   Entered and Authorized by:   Nolon Rod. Berdena Cisek MD   Signed by:   Nolon Rod. Thinh Cuccaro MD on 08/14/2010   Method used:   Print then Give to Patient   RxID:   [id number] HYDROCHLOROTHIAZIDE 25 MG TABS (HYDROCHLOROTHIAZIDE) 1 by mouth once daily  #90 x 3   Entered and Authorized by:   Nolon Rod. Garrette Caine MD   Signed by:   Nolon Rod. Shereese Bonnie MD on 08/14/2010   Method used:   Print then Give to Patient   RxID:   [id number] NORVASC 10 MG TABS (AMLODIPINE BESYLATE) 1 by mouth once daily  #90 x 3   Entered and Authorized by:   Nolon Rod. Leeanna Slaby MD   Signed by:   Nolon Rod. Jourden Gilson MD on 08/14/2010   Method used:   Print then Give to Patient   RxID:   [id number]    Orders Added: 1)  Est. Patient Level III [06301]   Immunization History:  Influenza Immunization History:    Influenza:  recommended, declined  (08/14/2010)   Immunization History:  Influenza Immunization History:    Influenza:  recommended, declined (08/14/2010)

## 2010-11-10 ENCOUNTER — Other Ambulatory Visit (INDEPENDENT_AMBULATORY_CARE_PROVIDER_SITE_OTHER): Payer: BC Managed Care – PPO

## 2010-11-10 DIAGNOSIS — E785 Hyperlipidemia, unspecified: Secondary | ICD-10-CM

## 2010-11-10 LAB — AST: AST: 25 U/L (ref 0–37)

## 2010-11-10 LAB — LDL CHOLESTEROL, DIRECT: Direct LDL: 203.4 mg/dL

## 2010-11-10 LAB — LIPID PANEL: Total CHOL/HDL Ratio: 5

## 2010-11-13 ENCOUNTER — Telehealth: Payer: Self-pay | Admitting: Internal Medicine

## 2010-11-13 NOTE — Telephone Encounter (Signed)
Message left for patient to return my call.  

## 2010-11-13 NOTE — Telephone Encounter (Signed)
Patient was seen in December, simvastatin was discontinued you can shoulder pain. She has been now on Pravachol since then. Cholesterol panel showed poor control of her cholesterol. Plan: Switch from Pravachol to Crestor 20 mg. Please let patient know and call the  Prescription  #30, 3 RF Needs to come back to the office he is fasting in 2 months

## 2010-11-16 NOTE — Telephone Encounter (Signed)
Spoke w/ pt doesn't want to switch medications at this time would like to stay on pravachol says she will work on taking medication regularly.

## 2010-12-04 ENCOUNTER — Encounter: Payer: Self-pay | Admitting: Internal Medicine

## 2010-12-04 ENCOUNTER — Ambulatory Visit (INDEPENDENT_AMBULATORY_CARE_PROVIDER_SITE_OTHER): Payer: BC Managed Care – PPO | Admitting: Internal Medicine

## 2010-12-04 VITALS — BP 126/84 | HR 57 | Wt 158.6 lb

## 2010-12-04 DIAGNOSIS — R229 Localized swelling, mass and lump, unspecified: Secondary | ICD-10-CM

## 2010-12-04 DIAGNOSIS — R2240 Localized swelling, mass and lump, unspecified lower limb: Secondary | ICD-10-CM

## 2010-12-04 NOTE — Progress Notes (Signed)
  Subjective:    Patient ID: Poetry Cerro, female    DOB: [birth year], 65 y.o.   MRN: [id number]  HPI Noted a "knot" at the dorsum of R foot yesterday   Past Medical History  Diagnosis Date  . Hyperlipemia   . Hypertension   . Osteopenia     per pt had a DEXA few years ago  . Allergic rhinitis   . Shingles 3/10   Past Surgical History  Procedure Date  . Breast lumpectomy     fibrocystic  . 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 slt mobile to manipulation       Assessment & Plan:

## 2010-12-04 NOTE — Assessment & Plan Note (Signed)
Induration likely a small synovial cyst XRs If XR neg, will rec observation

## 2010-12-04 NOTE — Patient Instructions (Signed)
Get the XR done today or Monday

## 2010-12-07 ENCOUNTER — Ambulatory Visit (INDEPENDENT_AMBULATORY_CARE_PROVIDER_SITE_OTHER)
Admission: RE | Admit: 2010-12-07 | Discharge: 2010-12-07 | Disposition: A | Discharge status: Home / Self Care | Payer: BC Managed Care – PPO | Source: Ambulatory Visit | Attending: Internal Medicine | Admitting: Internal Medicine

## 2010-12-07 DIAGNOSIS — R2240 Localized swelling, mass and lump, unspecified lower limb: Secondary | ICD-10-CM

## 2010-12-07 DIAGNOSIS — R229 Localized swelling, mass and lump, unspecified: Secondary | ICD-10-CM

## 2010-12-09 ENCOUNTER — Telehealth: Payer: Self-pay | Admitting: *Deleted

## 2010-12-09 NOTE — Telephone Encounter (Signed)
Spoke w/ pt aware of recommendations. 

## 2010-12-09 NOTE — Telephone Encounter (Signed)
Message left for patient to return my call.  

## 2010-12-09 NOTE — Telephone Encounter (Signed)
Message copied by Army Fossa on Wed Dec 09, 2010 11:52 AM ------      Message from: Katherine Koch      Created: Tue Dec 08, 2010  9:13 PM       Advise patient:      XR ok, likely has a ganglion cyst.      Pt to call if area hurts or get worse, bigger

## 2011-02-12 ENCOUNTER — Ambulatory Visit (INDEPENDENT_AMBULATORY_CARE_PROVIDER_SITE_OTHER): Payer: BC Managed Care – PPO | Admitting: Internal Medicine

## 2011-02-12 ENCOUNTER — Encounter: Payer: Self-pay | Admitting: Internal Medicine

## 2011-02-12 DIAGNOSIS — M949 Disorder of cartilage, unspecified: Secondary | ICD-10-CM

## 2011-02-12 DIAGNOSIS — Z0000 Encounter for general adult medical examination without abnormal findings: Secondary | ICD-10-CM

## 2011-02-12 DIAGNOSIS — E785 Hyperlipidemia, unspecified: Secondary | ICD-10-CM

## 2011-02-12 LAB — COMPREHENSIVE METABOLIC PANEL
AST: 32 U/L (ref 0–37)
CO2: 30 mEq/L (ref 19–32)
Chloride: 102 mEq/L (ref 96–112)
Glucose, Bld: 87 mg/dL (ref 70–99)
Potassium: 4.5 mEq/L (ref 3.5–5.1)
Total Bilirubin: 0.9 mg/dL (ref 0.3–1.2)
Total Protein: 7.3 g/dL (ref 6.0–8.3)

## 2011-02-12 LAB — CBC WITH DIFFERENTIAL/PLATELET
Basophils Absolute: 0 10*3/uL (ref 0.0–0.1)
Basophils Relative: 0.7 % (ref 0.0–3.0)
Eosinophils Absolute: 0.1 10*3/uL (ref 0.0–0.7)
HCT: 38 % (ref 36.0–46.0)
Hemoglobin: 12.9 g/dL (ref 12.0–15.0)
Lymphocytes Relative: 31.6 % (ref 12.0–46.0)
MCHC: 33.9 g/dL (ref 30.0–36.0)
MCV: 88.2 fl (ref 78.0–100.0)
WBC: 3.9 10*3/uL — ABNORMAL LOW (ref 4.5–10.5)

## 2011-02-12 LAB — LIPID PANEL
HDL: 50.1 mg/dL (ref >39.00)
Triglycerides: 91 mg/dL (ref 0.0–149.0)

## 2011-02-12 NOTE — Progress Notes (Signed)
  Subjective:    Patient ID: Katherine Koch, female    DOB: April 17, 1946, 65 y.o.   MRN: [id number]  HPI CPX  Past Medical History  Diagnosis Date  . Hyperlipemia   . Hypertension   . Osteopenia     per pt had a DEXA few years ago  . Allergic rhinitis   . Shingles 3/10    Past Surgical History  Procedure Date  . Breast lumpectomy     fibrocystic  . Abdominal hysterectomy     oophorectomy- remotely     Family History: CAD - M ? 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  Respiratory: Negative for cough and wheezing.   Cardiovascular: Negative for chest pain. Leg swelling: rarely has LE edema.  Gastrointestinal: Negative for abdominal pain and blood in stool.  Genitourinary: Negative for dysuria, hematuria, vaginal bleeding and vaginal discharge.       No frecuent SBE  Psychiatric/Behavioral:       No anxiety-depression       Objective:   Physical Exam  Constitutional: She is oriented to person, place, and time. She appears well-developed and well-nourished. No distress.  HENT:  Head: Normocephalic and atraumatic.  Neck: No thyromegaly present.  Cardiovascular: Normal rate, regular rhythm and normal heart sounds.   No murmur heard. Pulmonary/Chest: Effort normal and breath sounds normal. No respiratory distress. She has no wheezes. She has no rales.  Genitourinary:       Breast exam B normal, no axilary LADs  Musculoskeletal: She exhibits no edema.  Neurological: She is alert and oriented to person, place, and time.  Skin: Skin is warm and dry. She is not diaphoretic.  Psychiatric: She has a normal  mood and affect. Her behavior is normal. Judgment and thought content normal.          Assessment & Plan:

## 2011-02-12 NOTE — Patient Instructions (Signed)
Calcium, vit D daily Schedule your mammogram

## 2011-02-12 NOTE — Assessment & Plan Note (Addendum)
Simvastatin d/c 07-2010 d/t aches, started pravachol, labs 10-2010 not at goal but did not take meds qd. Was Rx crestor but remained on pravachol: good med compliance at this point thus will re-check labs

## 2011-02-12 NOTE — Assessment & Plan Note (Addendum)
Td 4-10 pneumonia shot declined  "next year" had shingles 3-11  Does not see gyn anymore----> told by her gyn that doesn't need further PAPs  (last PAP ~2008) mammogram 5/11 negative----> pt will  schedule one Breast exam normal today, encourage self breast exam Diet exercise discussed  Quit tobacco---praised!   colonoscopy per patient aprox 2004, again 5-10, neg, next 10 years    counseled about tobacco

## 2011-02-12 NOTE — Assessment & Plan Note (Addendum)
Last  DEXA 02-2010: T score -2.4 Vit D levels consistently normal On ca , vit D, exercise

## 2011-02-16 ENCOUNTER — Encounter: Payer: Self-pay | Admitting: *Deleted

## 2011-02-16 ENCOUNTER — Telehealth: Payer: Self-pay | Admitting: *Deleted

## 2011-02-16 NOTE — Telephone Encounter (Signed)
Tried to call pt, per designated party release she states do not leave Voicemail. Will attempt to try pt again, will also mail pt a letter asking her to contact our office.

## 2011-02-16 NOTE — Telephone Encounter (Signed)
Message copied by Leanne Lovely on Tue Feb 16, 2011  2:07 PM ------      Message from: Katherine Koch      Created: Tue Feb 16, 2011  1:39 PM       Advise patient,       Truitt Leep is not taking care of her cholesterol. LDL is 187, goal less than 130.      other labs normal.      Plan:       Discontinue Pravachol, start Crestor 20 mg 1 by mouth daily.      Arrange for a FLP, AST, ALT in 6 weeks

## 2011-02-18 NOTE — Telephone Encounter (Signed)
Okay, recheck FLP in 3 months

## 2011-02-18 NOTE — Telephone Encounter (Signed)
Will re check in 3 months.

## 2011-02-18 NOTE — Telephone Encounter (Signed)
I spoke w/ pt she states that she is going to be strict w/ her diet. She wants to stay on the pravachol for another 3 months and she will come back for bloodwork.

## 2011-05-05 ENCOUNTER — Other Ambulatory Visit: Payer: Self-pay | Admitting: Internal Medicine

## 2011-05-05 DIAGNOSIS — Z1231 Encounter for screening mammogram for malignant neoplasm of breast: Secondary | ICD-10-CM

## 2011-06-09 ENCOUNTER — Ambulatory Visit
Admission: RE | Admit: 2011-06-09 | Discharge: 2011-06-09 | Disposition: A | Discharge status: Home / Self Care | Payer: BC Managed Care – PPO | Source: Ambulatory Visit | Attending: Internal Medicine | Admitting: Internal Medicine

## 2011-06-09 DIAGNOSIS — Z1231 Encounter for screening mammogram for malignant neoplasm of breast: Secondary | ICD-10-CM

## 2011-08-13 ENCOUNTER — Encounter: Payer: Self-pay | Admitting: Internal Medicine

## 2011-08-13 ENCOUNTER — Ambulatory Visit (INDEPENDENT_AMBULATORY_CARE_PROVIDER_SITE_OTHER): Payer: Medicare Other | Admitting: Internal Medicine

## 2011-08-13 VITALS — BP 130/76 | HR 76 | Temp 98.6°F | Wt 159.6 lb

## 2011-08-13 DIAGNOSIS — I10 Essential (primary) hypertension: Secondary | ICD-10-CM

## 2011-08-13 DIAGNOSIS — E785 Hyperlipidemia, unspecified: Secondary | ICD-10-CM

## 2011-08-13 LAB — LIPID PANEL
Total CHOL/HDL Ratio: 4
Triglycerides: 92 mg/dL (ref 0.0–149.0)
VLDL: 18.4 mg/dL (ref 0.0–40.0)

## 2011-08-13 LAB — LDL CHOLESTEROL, DIRECT: Direct LDL: 139.4 mg/dL

## 2011-08-13 MED ORDER — PRAVASTATIN SODIUM 40 MG PO TABS: 40.0000 mg | ORAL_TABLET | Freq: Every day | ORAL | Status: DC

## 2011-08-13 MED ORDER — ENALAPRIL MALEATE 20 MG PO TABS: 20.0000 mg | ORAL_TABLET | Freq: Every day | ORAL | Status: DC

## 2011-08-13 MED ORDER — ATENOLOL 50 MG PO TABS: 50.0000 mg | ORAL_TABLET | Freq: Every day | ORAL | Status: DC

## 2011-08-13 MED ORDER — HYDROCHLOROTHIAZIDE 25 MG PO TABS: 25.0000 mg | ORAL_TABLET | Freq: Every day | ORAL | Status: DC

## 2011-08-13 MED ORDER — AMLODIPINE BESYLATE 10 MG PO TABS: 10.0000 mg | ORAL_TABLET | Freq: Every day | ORAL | Status: DC

## 2011-08-13 NOTE — Assessment & Plan Note (Signed)
good medication compliance occ edema, due to amlodipine?  rec no change

## 2011-08-13 NOTE — Assessment & Plan Note (Addendum)
Base on last FLP  I rec crestor, she declined, decided to cont pravachol and improve her life style which she did Plan: Labs Goals discussed

## 2011-08-13 NOTE — Progress Notes (Signed)
  Subjective:    Patient ID: Katherine Koch, female    DOB: 1945/09/17, 65 y.o.   MRN: [id number]  HPI ROV Still occ ankle edema, decreases w/ leg elevation Some pain in the heels   Past Medical History  Diagnosis Date  . Hyperlipemia   . Hypertension   . Osteopenia     per pt had a DEXA few years ago  . Allergic rhinitis   . Shingles 3/10   Past Surgical History  Procedure Date  . Breast lumpectomy     fibrocystic  . Abdominal hysterectomy     oophorectomy- remotely    Review of Systems Good medication compliance with BP meds, ambulatory blood pressure 122/80. She was recommended Crestor, did not try, she stayed in [place] [facility] and worked  on her lifestyle. Has not had a flu shot, declining to have one today.    Objective:   Physical Exam  Constitutional: She is oriented to person, place, and time. She appears well-developed and well-nourished.  Cardiovascular: Normal rate and regular rhythm.   No murmur heard. Pulmonary/Chest: Effort normal and breath sounds normal. No respiratory distress. She has no wheezes. She has no rales.  Musculoskeletal: She exhibits no edema.  Neurological: She is alert and oriented to person, place, and time.      Assessment & Plan:

## 2011-08-15 ENCOUNTER — Encounter: Payer: Self-pay | Admitting: Internal Medicine

## 2011-08-18 ENCOUNTER — Encounter: Payer: Self-pay | Admitting: Internal Medicine

## 2012-02-11 ENCOUNTER — Encounter: Payer: Medicare Other | Admitting: Internal Medicine

## 2012-02-15 ENCOUNTER — Encounter: Payer: Self-pay | Admitting: Internal Medicine

## 2012-02-15 ENCOUNTER — Ambulatory Visit (INDEPENDENT_AMBULATORY_CARE_PROVIDER_SITE_OTHER): Payer: Medicare Other | Admitting: Internal Medicine

## 2012-02-15 VITALS — BP 144/82 | HR 54 | Temp 98.4°F | Ht 66.25 in | Wt 157.0 lb

## 2012-02-15 DIAGNOSIS — M899 Disorder of bone, unspecified: Secondary | ICD-10-CM

## 2012-02-15 DIAGNOSIS — E785 Hyperlipidemia, unspecified: Secondary | ICD-10-CM

## 2012-02-15 DIAGNOSIS — H04129 Dry eye syndrome of unspecified lacrimal gland: Secondary | ICD-10-CM

## 2012-02-15 DIAGNOSIS — I10 Essential (primary) hypertension: Secondary | ICD-10-CM

## 2012-02-15 DIAGNOSIS — M949 Disorder of cartilage, unspecified: Secondary | ICD-10-CM

## 2012-02-15 DIAGNOSIS — Z0000 Encounter for general adult medical examination without abnormal findings: Secondary | ICD-10-CM

## 2012-02-15 LAB — COMPREHENSIVE METABOLIC PANEL
ALT: 22 U/L (ref 0–35)
AST: 26 U/L (ref 0–37)
Alkaline Phosphatase: 64 U/L (ref 39–117)
CO2: 29 mEq/L (ref 19–32)
Chloride: 101 mEq/L (ref 96–112)
Creatinine, Ser: 0.8 mg/dL (ref 0.4–1.2)
Sodium: 139 mEq/L (ref 135–145)
Total Bilirubin: 0.9 mg/dL (ref 0.3–1.2)
Total Protein: 8.3 g/dL (ref 6.0–8.3)

## 2012-02-15 LAB — CBC WITH DIFFERENTIAL/PLATELET
Hemoglobin: 13.1 g/dL (ref 12.0–15.0)
Lymphocytes Relative: 24.8 % (ref 12.0–46.0)
MCHC: 32.8 g/dL (ref 30.0–36.0)
Monocytes Absolute: 0.3 10*3/uL (ref 0.1–1.0)
Neutro Abs: 3.1 10*3/uL (ref 1.4–7.7)
Platelets: 272 10*3/uL (ref 150.0–400.0)
RDW: 13.5 % (ref 11.5–14.6)

## 2012-02-15 LAB — LIPID PANEL
Total CHOL/HDL Ratio: 4
Triglycerides: 141 mg/dL (ref 0.0–149.0)
VLDL: 28.2 mg/dL (ref 0.0–40.0)

## 2012-02-15 LAB — TSH: TSH: 0.87 u[IU]/mL (ref 0.35–5.50)

## 2012-02-15 MED ORDER — ATENOLOL 50 MG PO TABS: 50.0000 mg | ORAL_TABLET | Freq: Every day | ORAL | Status: DC

## 2012-02-15 MED ORDER — HYDROCHLOROTHIAZIDE 25 MG PO TABS: 25.0000 mg | ORAL_TABLET | Freq: Every day | ORAL | Status: DC

## 2012-02-15 MED ORDER — ENALAPRIL MALEATE 20 MG PO TABS: 20.0000 mg | ORAL_TABLET | Freq: Every day | ORAL | Status: DC

## 2012-02-15 MED ORDER — PRAVASTATIN SODIUM 40 MG PO TABS: 40.0000 mg | ORAL_TABLET | Freq: Every day | ORAL | Status: DC

## 2012-02-15 MED ORDER — AMLODIPINE BESYLATE 10 MG PO TABS: 10.0000 mg | ORAL_TABLET | Freq: Every day | ORAL | Status: DC

## 2012-02-15 NOTE — Assessment & Plan Note (Signed)
BP slightly elevated today but not ambulatory BPs. No change, check a CMP, CBC TSH.

## 2012-02-15 NOTE — Progress Notes (Signed)
  Subjective:    Patient ID: Katherine Koch, female    DOB: 03/15/1946, 66 y.o.   MRN: [id number]  HPI Here for Medicare AWV:  1. Risk factors based on Past M, S, F history: reviewed 2. Physical Activities: goes to the gym x 5/week 3. Depression/mood:  No problemss noted or reported  4. Hearing:  "Not great but I don't need anything" , no problemss noted  5. ADL's:independent    6. Fall Risk: no recent falls , prevention discussed  7. home Safety: does feelsafe at home  8. Height, weight, &visual acuity: see VS, recently saw ophthalmology, DR Hazle Quant, dx w/ dry eye rx lotemax , needs a second opinion  9. Counseling: provided 10. Labs ordered based on risk factors: if needed  11. Referral Coordination: if needed 12.  Care Plan, see assessment and plan  13.   Cognitive Assessment: Cognition and motor skills within normal  In addition, today we discussed the following: Hypertension, good medication compliance, blood pressure today 144/82, ambulatory BPs better than that. Osteopenia, on calcium, vitamin D and exercise. High cholesterol, good medication compliance, no apparent side effects  Past Medical History  Diagnosis Date  . Hyperlipemia   . Hypertension   . Osteopenia     per pt had a DEXA few years ago  . Allergic rhinitis   . Shingles 3/10  . Dry eye syndrome    Past Surgical History  Procedure Date  . Breast lumpectomy     fibrocystic  . Abdominal hysterectomy     oophorectomy- remotely    Family History: CAD - M ? 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 No dysuria gross  hematuria.     Objective:   Physical Exam General -- alert, well-developed, and well-nourished.   Neck --no thyromegaly , normal carotid pulse Breasts-- No mass, nodules, thickening, tenderness, bulging, retraction, inflamation, nipple discharge or skin changes noted.  no axillary lymph nodes Lungs -- normal respiratory effort, no intercostal retractions, no accessory muscle use, and normal breath sounds.   Heart-- normal rate, regular rhythm, no murmur, and no gallop.   Abdomen--soft, non-tender, no distention, no masses, no HSM, no guarding, and no rigidity.   Extremities-- no pretibial edema bilaterally Neurologic-- alert & oriented X3 and strength normal in all extremities. Psych-- Cognition and judgment appear intact. Alert and cooperative with normal attention span and concentration.  not anxious appearing and not depressed appearing.       Assessment & Plan:

## 2012-02-15 NOTE — Assessment & Plan Note (Addendum)
Td 4-10 pneumonia shot declined  , won't have a flu shot either, benefits discussed had shingles 3-11, not interested on zostavax  Does not see gyn anymore----> told by her gyn that doesn't need further PAPs  (last PAP ~2008) mammogram 04-2011 negative, breast exam normal today  colonoscopy per patient aprox 2004, again 5-10---> neg, next 10 years  Encouraged healthy diet, information provided. Continue exercise

## 2012-02-15 NOTE — Assessment & Plan Note (Signed)
See history of present illness, needs a second opinion. Will arrange a referral

## 2012-02-15 NOTE — Assessment & Plan Note (Signed)
RF medications, check FLP

## 2012-02-15 NOTE — Assessment & Plan Note (Signed)
Last  DEXA 02-2010: T score -2.4 Vit D levels consistently normal On ca , vit D, exercise  Plan: Schedule a bone density test

## 2012-02-21 ENCOUNTER — Encounter: Payer: Self-pay | Admitting: *Deleted

## 2012-05-24 ENCOUNTER — Other Ambulatory Visit: Payer: Self-pay | Admitting: Internal Medicine

## 2012-05-24 DIAGNOSIS — Z1231 Encounter for screening mammogram for malignant neoplasm of breast: Secondary | ICD-10-CM

## 2012-06-16 ENCOUNTER — Ambulatory Visit
Admission: RE | Admit: 2012-06-16 | Discharge: 2012-06-16 | Disposition: A | Discharge status: Home / Self Care | Payer: Medicare Other | Source: Ambulatory Visit | Attending: Internal Medicine | Admitting: Internal Medicine

## 2012-06-16 DIAGNOSIS — Z1231 Encounter for screening mammogram for malignant neoplasm of breast: Secondary | ICD-10-CM

## 2012-08-18 ENCOUNTER — Ambulatory Visit (INDEPENDENT_AMBULATORY_CARE_PROVIDER_SITE_OTHER): Payer: Medicare Other | Admitting: Internal Medicine

## 2012-08-18 VITALS — BP 146/74 | HR 64 | Temp 97.6°F | Wt 165.0 lb

## 2012-08-18 DIAGNOSIS — I10 Essential (primary) hypertension: Secondary | ICD-10-CM

## 2012-08-18 DIAGNOSIS — M949 Disorder of cartilage, unspecified: Secondary | ICD-10-CM

## 2012-08-18 DIAGNOSIS — H04129 Dry eye syndrome of unspecified lacrimal gland: Secondary | ICD-10-CM

## 2012-08-18 DIAGNOSIS — M899 Disorder of bone, unspecified: Secondary | ICD-10-CM

## 2012-08-18 LAB — BASIC METABOLIC PANEL
BUN: 13 mg/dL (ref 6–23)
Calcium: 9.8 mg/dL (ref 8.4–10.5)
GFR: 86.01 mL/min (ref >60.00)
Potassium: 4.5 mEq/L (ref 3.5–5.1)

## 2012-08-18 NOTE — Progress Notes (Signed)
  Subjective:    Patient ID: Katherine Koch, female    DOB: 1945-09-26, 67 y.o.   MRN: [id number]  HPI ROV In general feeling well. Dry eye, she started to use over-the-counter eye lubricant feels great. She also saw another ophthalmologist. See assessment and plan.  Past Medical History  Diagnosis Date  . Hyperlipemia   . Hypertension   . Osteopenia     per pt had a DEXA few years ago  . Allergic rhinitis   . Shingles 3/10  . Dry eye syndrome    Past Surgical History  Procedure Date  . Breast lumpectomy     fibrocystic  . Abdominal hysterectomy     oophorectomy- remotely   Social History:  Divorced, lives by herself, 1 daughter  moved to [place] from [place] 11-09  Retired Tree surgeon  tobacco-- quit 2011  ETOH-- no  diet-- ok except for icecream   Review of Systems Good compliance with BP meds, ambulatory BPs usually 120, 150. Pulse from time to time gets down to 45,usually when she exercises regularly, is  asymptomatic. No chest pain or shortness of breath    Objective:   Physical Exam General -- alert, well-developed, and well-nourished.   Lungs -- normal respiratory effort, no intercostal retractions, no accessory muscle use, and normal breath sounds.   Heart-- normal rate, regular rhythm, no murmur, and no gallop.   Extremities-- no pretibial edema bilaterally Psych-- Cognition and judgment appear intact. Alert and cooperative with normal attention span and concentration.  not anxious appearing and not depressed appearing.       Assessment & Plan:  Declined a flu shot

## 2012-08-18 NOTE — Assessment & Plan Note (Signed)
Reports her eye dryness he is responding well to over-the-counter lubricants. Not using lotamax, rec an eye check from time to time

## 2012-08-18 NOTE — Assessment & Plan Note (Signed)
Patient does not like to have another bone density test at this point, in part because she does not like to take Fosamax. Advise patient that there is alternative such as reclast, Forteo, injectables. We agreed that we'll revisit the issue when she comes back.

## 2012-08-18 NOTE — Assessment & Plan Note (Signed)
Well control, check a BMP. Continue with same medications

## 2012-08-20 ENCOUNTER — Encounter: Payer: Self-pay | Admitting: Internal Medicine

## 2012-08-21 ENCOUNTER — Encounter: Payer: Self-pay | Admitting: *Deleted

## 2013-02-23 ENCOUNTER — Encounter: Payer: Self-pay | Admitting: Internal Medicine

## 2013-02-23 ENCOUNTER — Ambulatory Visit (INDEPENDENT_AMBULATORY_CARE_PROVIDER_SITE_OTHER): Payer: Medicare Other | Admitting: Internal Medicine

## 2013-02-23 VITALS — BP 175/95 | HR 77 | Temp 98.2°F | Ht 66.5 in | Wt 160.2 lb

## 2013-02-23 DIAGNOSIS — Z0000 Encounter for general adult medical examination without abnormal findings: Secondary | ICD-10-CM

## 2013-02-23 DIAGNOSIS — E785 Hyperlipidemia, unspecified: Secondary | ICD-10-CM

## 2013-02-23 DIAGNOSIS — J309 Allergic rhinitis, unspecified: Secondary | ICD-10-CM

## 2013-02-23 DIAGNOSIS — M949 Disorder of cartilage, unspecified: Secondary | ICD-10-CM

## 2013-02-23 DIAGNOSIS — I10 Essential (primary) hypertension: Secondary | ICD-10-CM

## 2013-02-23 LAB — LIPID PANEL
LDL Cholesterol: 130 mg/dL — ABNORMAL HIGH (ref 0–99)
Total CHOL/HDL Ratio: 4
Triglycerides: 70 mg/dL (ref 0.0–149.0)
VLDL: 14 mg/dL (ref 0.0–40.0)

## 2013-02-23 LAB — COMPREHENSIVE METABOLIC PANEL
ALT: 26 U/L (ref 0–35)
AST: 26 U/L (ref 0–37)
Alkaline Phosphatase: 57 U/L (ref 39–117)
CO2: 28 mEq/L (ref 19–32)
Creatinine, Ser: 0.8 mg/dL (ref 0.4–1.2)
GFR: 79.47 mL/min (ref >60.00)
Sodium: 141 mEq/L (ref 135–145)
Total Bilirubin: 0.8 mg/dL (ref 0.3–1.2)
Total Protein: 8 g/dL (ref 6.0–8.3)

## 2013-02-23 MED ORDER — AMLODIPINE BESYLATE 10 MG PO TABS: 10.0000 mg | ORAL_TABLET | Freq: Every day | ORAL | Status: DC

## 2013-02-23 MED ORDER — FLUTICASONE PROPIONATE 50 MCG/ACT NA SUSP: 2.0000 | Freq: Every day | NASAL | Status: DC

## 2013-02-23 MED ORDER — ENALAPRIL MALEATE 20 MG PO TABS: 20.0000 mg | ORAL_TABLET | Freq: Every day | ORAL | Status: DC

## 2013-02-23 MED ORDER — HYDROCHLOROTHIAZIDE 25 MG PO TABS: 25.0000 mg | ORAL_TABLET | Freq: Every day | ORAL | Status: DC

## 2013-02-23 MED ORDER — PRAVASTATIN SODIUM 40 MG PO TABS: 40.0000 mg | ORAL_TABLET | Freq: Every day | ORAL | Status: DC

## 2013-02-23 MED ORDER — ATENOLOL 50 MG PO TABS: 50.0000 mg | ORAL_TABLET | Freq: Every day | ORAL | Status: DC

## 2013-02-23 NOTE — Patient Instructions (Addendum)
Check the  blood pressure 2 or 3 times a week, be sure it is between 110/60 and 140/85. If it is consistently higher or lower, let me know Healthy diet and exercise regularly Please come back in one month for a nurse visit, call for an appointment. My nurse needs to take your blood pressure to be sure is back to normal. No charge.  Fall Prevention and Home Safety Falls cause injuries and can affect all age groups. It is possible to use preventive measures to significantly decrease the likelihood of falls. There are many simple measures which can make your home safer and prevent falls. OUTDOORS  Repair cracks and edges of walkways and driveways.  Remove high doorway thresholds.  Trim shrubbery on the main path into your home.  Have good outside lighting.  Clear walkways of tools, rocks, debris, and clutter.  Check that handrails are not broken and are securely fastened. Both sides of steps should have handrails.  Have leaves, snow, and ice cleared regularly.  Use sand or salt on walkways during winter months.  In the garage, clean up grease or oil spills. BATHROOM  Install night lights.  Install grab bars by the toilet and in the tub and shower.  Use non-skid mats or decals in the tub or shower.  Place a plastic non-slip stool in the shower to sit on, if needed.  Keep floors dry and clean up all water on the floor immediately.  Remove soap buildup in the tub or shower on a regular basis.  Secure bath mats with non-slip, double-sided rug tape.  Remove throw rugs and tripping hazards from the floors. BEDROOMS  Install night lights.  Make sure a bedside light is easy to reach.  Do not use oversized bedding.  Keep a telephone by your bedside.  Have a firm chair with side arms to use for getting dressed.  Remove throw rugs and tripping hazards from the floor. KITCHEN  Keep handles on pots and pans turned toward the center of the stove. Use back burners when  possible.  Clean up spills quickly and allow time for drying.  Avoid walking on wet floors.  Avoid hot utensils and knives.  Position shelves so they are not too high or low.  Place commonly used objects within easy reach.  If necessary, use a sturdy step stool with a grab bar when reaching.  Keep electrical cables out of the way.  Do not use floor polish or wax that makes floors slippery. If you must use wax, use non-skid floor wax.  Remove throw rugs and tripping hazards from the floor. STAIRWAYS  Never leave objects on stairs.  Place handrails on both sides of stairways and use them. Fix any loose handrails. Make sure handrails on both sides of the stairways are as long as the stairs.  Check carpeting to make sure it is firmly attached along stairs. Make repairs to worn or loose carpet promptly.  Avoid placing throw rugs at the top or bottom of stairways, or properly secure the rug with carpet tape to prevent slippage. Get rid of throw rugs, if possible.  Have an electrician put in a light switch at the top and bottom of the stairs. OTHER FALL PREVENTION TIPS  Wear low-heel or rubber-soled shoes that are supportive and fit well. Wear closed toe shoes.  When using a stepladder, make sure it is fully opened and both spreaders are firmly locked. Do not climb a closed stepladder.  Add color or contrast paint  or tape to grab bars and handrails in your home. Place contrasting color strips on first and last steps.  Learn and use mobility aids as needed. Install an electrical emergency response system.  Turn on lights to avoid dark areas. Replace light bulbs that burn out immediately. Get light switches that glow.  Arrange furniture to create clear pathways. Keep furniture in the same place.  Firmly attach carpet with non-skid or double-sided tape.  Eliminate uneven floor surfaces.  Select a carpet pattern that does not visually hide the edge of steps.  Be aware of all  pets. OTHER HOME SAFETY TIPS  Set the water temperature for 120 F (48.8 C).  Keep emergency numbers on or near the telephone.  Keep smoke detectors on every level of the home and near sleeping areas. Document Released: 07/23/2002 Document Revised: 02/01/2012 Document Reviewed: 10/22/2011 [facility] Patient Information 2014 [place].

## 2013-02-23 NOTE — Assessment & Plan Note (Addendum)
Td 4-10 pneumonia shot declined again had shingles 3-11, again not interested on zostavax   Does not see gyn anymore----> told by her gyn that doesn't need further PAPs  (last PAP ~2008) mammogram 05-2012 negative, breast exam normal today  colonoscopy per patient aprox 2004, again 5-10---> neg, next 10 years  Encouraged to go back to a healthy diet and routine exercise

## 2013-02-23 NOTE — Assessment & Plan Note (Addendum)
Reports good compliance with medications, diet has not been good at all for the last 3 weeks. Plan: Go back to healthy diet, labs

## 2013-02-23 NOTE — Progress Notes (Signed)
Subjective:    Patient ID: Katherine Koch, female    DOB: 1946-04-16, 67 y.o.   MRN: [id number]  HPI Here for Medicare AWV:  1. Risk factors based on Past M, S, F history: reviewed  2. Physical Activities: goes to the gym x 5/week (except x last 3 weeks, was on vacation)  3. Depression/mood: No problemss noted or reported  4. Hearing: no problemss noted or reported 5. ADL's:independent  6. Fall Risk: no recent falls , prevention discussed  7. home Safety: does feelsafe at home  8. Height, weight, &visual acuity: see VS, sees ophthalmology for dry eye  9. Counseling: provided  10. Labs ordered based on risk factors: if needed  11. Referral Coordination: if needed  12. Care Plan, see assessment and plan  13. Cognitive Assessment: Cognition and motor skills within normal   In addition, today we discussed the following: Hypertension, BP elevated today. Has been on vacation and not following a routine exercise or a healthy diet, see assessment and plan History of allergies, here lately reports some nasal congestion and mild facial pressure, no nasal discharge. High cholesterol, good medication compliance. Osteopenia, on calcium vitamin D . Very active usually.  Past Medical History  Diagnosis Date  . Hyperlipemia   . Hypertension   . Osteopenia     per pt had a DEXA few years ago  . Allergic rhinitis   . Shingles 3/10  . Dry eye syndrome    Past Surgical History  Procedure Laterality Date  . Breast lumpectomy      fibrocystic  . Abdominal hysterectomy      oophorectomy- remotely   Family History  Problem Relation Age of Onset  . Coronary artery disease Mother     onset?  . Diabetes Neg Hx   . Stroke Neg Hx   . Hypertension Mother   . Hypertension Brother   . Colon cancer Neg Hx   . Breast cancer Neg Hx   . Lymphoma Mother     deceased non-hodgkins     Social History:  Divorced, lives by herself, 1 daughter  moved to [place] from [place] 11-09  Retired Tree surgeon   tobacco-- quit 2011  ETOH-- no     Review of Systems Diet, not healthy for the last 3 weeks, she has been on vacation, eating unhealthy, lots of salt Exercise, Goes to the gym regularly but not for the last 3 weeks. No fever chills Denies cough, chest congestion. Denies chest pain, shortness or breath, nausea, vomiting, diarrhea or blood in the stools. No dysuria or vaginal bleeding      Objective:   Physical Exam BP 175/95  Pulse 77  Temp(Src) 98.2 F (36.8 C) (Oral)  Ht 5' 6.5" (1.689 m)  Wt 160 lb 3.2 oz (72.666 kg)  BMI 25.47 kg/m2  SpO2 97%  General -- alert, well-developed, NAD .   Neck --no thyromegaly , normal carotid pulse Breasts-- No mass, nodules, thickening, tenderness, bulging, retraction, inflamation, nipple discharge or skin changes noted.  no axillary lymph nodes Lungs -- normal respiratory effort, no intercostal retractions, no accessory muscle use, and normal breath sounds.   Heart-- normal rate, regular rhythm, no murmur, and no gallop.   Abdomen--soft, non-tender, no distention, no masses, no HSM, no guarding, and no rigidity.   Extremities-- no pretibial edema bilaterally  Neurologic-- alert & oriented X3 and strength normal in all extremities. Psych-- Cognition and judgment appear intact. Alert and cooperative with normal attention span and concentration.  not  anxious appearing and not depressed appearing.       Assessment & Plan:

## 2013-02-23 NOTE — Assessment & Plan Note (Signed)
See previous entry, pt states will call and make an appointment for a DEXa

## 2013-02-23 NOTE — Assessment & Plan Note (Addendum)
For the   last 3 weeks, has been eating unhealthy, lots of sodium.  BP was good before goin on vacation, BP today elevated. Plan: Go back to healthy lifestyle, self check BP regularly, come back for a nurse visit in one month ( no charge) for a BP check.

## 2013-02-23 NOTE — Assessment & Plan Note (Signed)
Mild sx, see history of present illness, Rx flonase

## 2013-02-25 ENCOUNTER — Encounter: Payer: Self-pay | Admitting: Internal Medicine

## 2013-02-26 ENCOUNTER — Encounter: Payer: Self-pay | Admitting: *Deleted

## 2013-04-06 ENCOUNTER — Ambulatory Visit: Payer: Medicare Other

## 2013-04-06 ENCOUNTER — Telehealth: Payer: Self-pay

## 2013-04-06 DIAGNOSIS — I10 Essential (primary) hypertension: Secondary | ICD-10-CM

## 2013-04-06 MED ORDER — CLONIDINE HCL 0.1 MG PO TABS: ORAL_TABLET | ORAL | Status: DC

## 2013-04-06 NOTE — Telephone Encounter (Signed)
thx

## 2013-04-06 NOTE — Telephone Encounter (Signed)
Patient presents to the office for a recheck on BP. Patient readings are 178/90 in left arm. After patient waited for 10 minutes recheck in right arm, BP 160/90.  Per [facility] order, start Clonodine 0.2mg  1 po bid. Rx #60 refill x2. Patient insurance will only cover 0.1mg . New Rx for Clonodine 0.1 mg 2 po bid #120 refill x2. Provider instructions to recheck BP with OV in one month. Patient agreeable with plan. Scheduled her follow up. States will take her medication.

## 2013-04-09 ENCOUNTER — Telehealth: Payer: Self-pay | Admitting: Internal Medicine

## 2013-04-09 NOTE — Telephone Encounter (Signed)
Called pt to verify exactly what her symptoms are. LMOVM to return call.

## 2013-04-09 NOTE — Telephone Encounter (Signed)
Caller: Jalexis/Patient; Phone: [phone]; Reason for Call: Called to discuss side effects noted on new medication with the MD.  Was uncomfortable discussing or describing the specifics to the triage nurse since the nurse is not in the office and not able to provide her exact location.  New medication "knocks me out" or makes her feel abnormal.  Please call back.

## 2013-04-26 ENCOUNTER — Other Ambulatory Visit (HOSPITAL_COMMUNITY): Payer: Self-pay | Admitting: Orthopaedic Surgery

## 2013-04-26 DIAGNOSIS — M25562 Pain in left knee: Secondary | ICD-10-CM

## 2013-05-03 ENCOUNTER — Ambulatory Visit (HOSPITAL_COMMUNITY)
Admission: RE | Admit: 2013-05-03 | Discharge: 2013-05-03 | Disposition: A | Discharge status: Home / Self Care | Payer: Medicare Other | Source: Ambulatory Visit | Attending: Orthopaedic Surgery | Admitting: Orthopaedic Surgery

## 2013-05-03 DIAGNOSIS — M25469 Effusion, unspecified knee: Secondary | ICD-10-CM | POA: Insufficient documentation

## 2013-05-03 DIAGNOSIS — M25569 Pain in unspecified knee: Secondary | ICD-10-CM | POA: Insufficient documentation

## 2013-05-03 DIAGNOSIS — M959 Acquired deformity of musculoskeletal system, unspecified: Secondary | ICD-10-CM | POA: Insufficient documentation

## 2013-05-03 DIAGNOSIS — M25562 Pain in left knee: Secondary | ICD-10-CM

## 2013-05-11 ENCOUNTER — Encounter: Payer: Self-pay | Admitting: Internal Medicine

## 2013-05-11 ENCOUNTER — Ambulatory Visit (INDEPENDENT_AMBULATORY_CARE_PROVIDER_SITE_OTHER): Payer: Medicare Other | Admitting: Internal Medicine

## 2013-05-11 VITALS — BP 163/89 | HR 73 | Temp 98.2°F | Wt 161.0 lb

## 2013-05-11 DIAGNOSIS — I10 Essential (primary) hypertension: Secondary | ICD-10-CM

## 2013-05-11 MED ORDER — METOPROLOL TARTRATE 50 MG PO TABS: 75.0000 mg | ORAL_TABLET | Freq: Two times a day (BID) | ORAL | Status: DC

## 2013-05-11 MED ORDER — LOSARTAN POTASSIUM 100 MG PO TABS: 100.0000 mg | ORAL_TABLET | Freq: Every day | ORAL | Status: DC

## 2013-05-11 NOTE — Progress Notes (Signed)
  Subjective:    Patient ID: Katherine Koch, female    DOB: [birth year], 67 y.o.   MRN: [id number]  HPI Hypertension f/u, Good compliance with medications. On clonidine twice a day , get very sleepy w/ such med consequently she is taking one at 5 in the morning and one At 10 PM . Ambulatory BPs usually very good at around 117/60, 120/70.  Past Medical History  Diagnosis Date  . Hyperlipemia   . Hypertension   . Osteopenia     per pt had a DEXA few years ago  . Allergic rhinitis   . Shingles 3/10  . Dry eye syndrome    Past Surgical History  Procedure Laterality Date  . Breast lumpectomy      fibrocystic  . Abdominal hysterectomy      oophorectomy- remotely   History   Social History  . Marital Status: Single    Spouse Name: N/A    Number of Children: 1  . Years of Education: N/A   Occupational History  . retired     Manufacturing systems engineer   Social History Main Topics  . Smoking status: Former Games developer  . Smokeless tobacco: Not on file     Comment: quit 2011  . Alcohol Use: No  . Drug Use: No  . Sexual Activity: Not on file   Other Topics Concern  . 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 Sodium intake usually okay.    Objective:   Physical Exam  BP 163/89  Pulse 73  Temp(Src) 98.2 F (36.8 C)  Wt 161 lb (73.029 kg)  BMI 25.6 kg/m2  SpO2 97% General -- alert, well-developed.  Lungs -- normal respiratory effort, no intercostal retractions, no accessory muscle use, and normal breath sounds.  Heart-- normal rate, regular rhythm, no murmur.  Extremities-- no pretibial edema bilaterally  Psych-- Cognition and judgment appear intact. Cooperative with normal attention span and concentration. Tearful, afraid her BP is too high     Assessment & Plan:

## 2013-05-11 NOTE — Assessment & Plan Note (Addendum)
current regimen: Atenolol 50 mg qd, Amlodipine 10 mg qd,, clonidine 0.1 bid,  Vasotec 20 mg daily, HCTZ 25 qd Ambulatory BP symptoms well-controlled but BP  consistently high in the office, patient quite distressed about high readings here, very emotional. Additionally unable to take clonidine long-term due to side effects.  Plan: Discontinue atenolol Discontinue Vasotec and clonidine Losartan 100 daily Metoprolol 50 mg 1.5 tablets twice a day Return to the office in 3 weeks

## 2013-05-11 NOTE — Patient Instructions (Signed)
Discontinue atenolol, clonidine and  Vasotec Start: Losartan 100 daily Metoprolol 50 mg 1.5 tablets twice a day Return to the office in 3 weeks   Check the  blood pressure 2  times a week, be sure it is between 110/60 and 140/85. If it is consistently higher or lower, let me know

## 2013-05-12 ENCOUNTER — Encounter: Payer: Self-pay | Admitting: Internal Medicine

## 2013-05-24 ENCOUNTER — Telehealth: Payer: Self-pay | Admitting: Internal Medicine

## 2013-05-24 NOTE — Telephone Encounter (Signed)
Pt notified via tele. DJR  

## 2013-05-24 NOTE — Telephone Encounter (Signed)
The patient ruled all of her BPs: BPs usually 123-153-135/80. Pulse 54-60 Advise patient, these are very good readings, continue the same medications and followup as recommended

## 2013-06-01 ENCOUNTER — Ambulatory Visit (INDEPENDENT_AMBULATORY_CARE_PROVIDER_SITE_OTHER): Payer: Medicare Other | Admitting: Internal Medicine

## 2013-06-01 ENCOUNTER — Encounter: Payer: Self-pay | Admitting: Internal Medicine

## 2013-06-01 VITALS — BP 172/93 | HR 65 | Temp 98.5°F | Wt 164.0 lb

## 2013-06-01 DIAGNOSIS — I10 Essential (primary) hypertension: Secondary | ICD-10-CM

## 2013-06-01 LAB — BASIC METABOLIC PANEL
BUN: 15 mg/dL (ref 6–23)
CO2: 30 mEq/L (ref 19–32)
Chloride: 103 mEq/L (ref 96–112)
Creatinine, Ser: 0.7 mg/dL (ref 0.4–1.2)
Glucose, Bld: 95 mg/dL (ref 70–99)
Potassium: 3.8 mEq/L (ref 3.5–5.1)

## 2013-06-01 NOTE — Patient Instructions (Signed)
Get your blood work before you leave  Next visit in   3 months   for a check up  Check the  blood pressure 2 or 3 times a  week be sure it is between 110/60 and 140/85. Ideal blood pressure is 120/80. If it is consistently higher or lower, let me know.

## 2013-06-01 NOTE — Progress Notes (Signed)
  Subjective:    Patient ID: Katherine Koch, female    DOB: 26-Mar-1946, 67 y.o.   MRN: [id number]  HPI Followup hypertension Good medication compliance, no apparent side effects. Ambulatory BPs range from 127-145, pulse 50-60.  Past Medical History  Diagnosis Date  . Hyperlipemia   . Hypertension   . Osteopenia     per pt had a DEXA few years ago  . Allergic rhinitis   . Shingles 3/10  . Dry eye syndrome    Past Surgical History  Procedure Laterality Date  . Breast lumpectomy      fibrocystic  . Abdominal hysterectomy      oophorectomy- remotely   History   Social History  . Marital Status: Single    Spouse Name: N/A    Number of Children: 1  . Years of Education: N/A   Occupational History  . retired     Manufacturing systems engineer   Social History Main Topics  . Smoking status: Former Games developer  . Smokeless tobacco: Not on file     Comment: quit 2011  . Alcohol Use: No  . Drug Use: No  . Sexual Activity: Not on file   Other Topics Concern  . 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     Objective:   Physical Exam BP 172/93  Pulse 65  Temp(Src) 98.5 F (36.9 C)  Wt 164 lb (74.39 kg)  BMI 26.08 kg/m2  SpO2 98% General -- alert, well-developed, NAD.  Lungs -- normal respiratory effort, no intercostal retractions, no accessory muscle use, and normal breath sounds.  Heart-- normal rate, regular rhythm, no murmur.   Extremities-- no pretibial edema bilaterally  Neurologic--  alert & oriented X3. Speech normal, gait normal, strength normal in all extremities.  Psych-- Cognition and judgment appear intact. Cooperative with normal attention span and concentration. No anxious appearing , no depressed appearing.       Assessment & Plan:

## 2013-06-02 ENCOUNTER — Encounter: Payer: Self-pay | Admitting: Internal Medicine

## 2013-06-02 NOTE — Assessment & Plan Note (Signed)
Hypertension-- Despite her high reading today, ambulatory BPs are great. We compare her BP cuff w/ ours and it gave [place] a good readings. Plan: BMP, same meds, return in 3 months

## 2013-06-04 ENCOUNTER — Encounter: Payer: Self-pay | Admitting: *Deleted

## 2013-07-02 ENCOUNTER — Other Ambulatory Visit: Payer: Self-pay

## 2013-07-02 DIAGNOSIS — Z1231 Encounter for screening mammogram for malignant neoplasm of breast: Secondary | ICD-10-CM

## 2013-08-14 ENCOUNTER — Ambulatory Visit
Admission: RE | Admit: 2013-08-14 | Discharge: 2013-08-14 | Disposition: A | Discharge status: Home / Self Care | Payer: Medicare Other | Source: Ambulatory Visit

## 2013-08-14 DIAGNOSIS — Z1231 Encounter for screening mammogram for malignant neoplasm of breast: Secondary | ICD-10-CM

## 2013-08-29 ENCOUNTER — Other Ambulatory Visit: Payer: Self-pay | Admitting: Internal Medicine

## 2013-08-29 NOTE — Telephone Encounter (Signed)
Losartan and Metoprolol refilled per protocol. JG//CMA

## 2013-09-04 ENCOUNTER — Ambulatory Visit (INDEPENDENT_AMBULATORY_CARE_PROVIDER_SITE_OTHER): Payer: Medicare Other | Admitting: Internal Medicine

## 2013-09-04 ENCOUNTER — Encounter: Payer: Self-pay | Admitting: Internal Medicine

## 2013-09-04 VITALS — BP 161/75 | HR 59 | Temp 98.6°F | Wt 164.0 lb

## 2013-09-04 DIAGNOSIS — I10 Essential (primary) hypertension: Secondary | ICD-10-CM

## 2013-09-04 MED ORDER — AMLODIPINE BESYLATE 10 MG PO TABS: 10.0000 mg | ORAL_TABLET | Freq: Every day | ORAL | Status: DC

## 2013-09-04 MED ORDER — HYDROCHLOROTHIAZIDE 25 MG PO TABS: 25.0000 mg | ORAL_TABLET | Freq: Every day | ORAL | Status: DC

## 2013-09-04 MED ORDER — METOPROLOL TARTRATE 50 MG PO TABS: ORAL_TABLET | ORAL | Status: DC

## 2013-09-04 MED ORDER — PRAVASTATIN SODIUM 40 MG PO TABS: 40.0000 mg | ORAL_TABLET | Freq: Every day | ORAL | Status: DC

## 2013-09-04 MED ORDER — LOSARTAN POTASSIUM 100 MG PO TABS: 100.0000 mg | ORAL_TABLET | Freq: Every day | ORAL | Status: DC

## 2013-09-04 NOTE — Patient Instructions (Signed)
Next visit is for routine check up regards  Hypertension  in 3 months  No need to come back fasting Please make an appointment       Check the  blood pressure 2 or 3 times a  week be sure it is between 110/60 and 140/85. Ideal blood pressure is 120/80. If it is consistently higher or lower, let me know  Exercise 3 hours a week     Sodium-Controlled Diet Sodium is a mineral. It is found in many foods. Sodium may be found naturally or added during the making of a food. The most common form of sodium is salt, which is made up of sodium and chloride. Reducing your sodium intake involves changing your eating habits. The following guidelines will help you reduce the sodium in your diet:  Stop using the salt shaker.  Use salt sparingly in cooking and baking.  Substitute with sodium-free seasonings and spices.  Do not use a salt substitute (potassium chloride) without your caregiver's permission.  Include a variety of fresh, unprocessed foods in your diet.  Limit the use of processed and convenience foods that are high in sodium. USE THE FOLLOWING FOODS SPARINGLY: Breads/Starches  Commercial bread stuffing, commercial pancake or waffle mixes, coating mixes. Waffles. Croutons. Prepared (boxed or frozen) potato, rice, or noodle mixes that contain salt or sodium. Salted [place] fries or hash browns. Salted popcorn, breads, crackers, chips, or snack foods. Vegetables  Vegetables canned with salt or prepared in cream, butter, or cheese sauces. Sauerkraut. Tomato or vegetable juices canned with salt.  Fresh vegetables are allowed if rinsed thoroughly. Fruit  Fruit is okay to eat. Meat and Meat Substitutes  Salted or smoked meats, such as bacon or Canadian bacon, chipped or corned beef, hot dogs, salt pork, luncheon meats, pastrami, ham, or sausage. Canned or smoked fish, poultry, or meat. Processed cheese or cheese spreads, blue or Roquefort cheese. Battered or frozen fish products.  Prepared spaghetti sauce. Baked beans. Reuben sandwiches. Salted nuts. Caviar. Milk  Limit buttermilk to 1 cup per week. Soups and Combination Foods  Bouillon cubes, canned or dried soups, broth, consomm. Convenience (frozen or packaged) dinners with more than 600 mg sodium. Pot pies, pizza, Asian food, fast food cheeseburgers, and specialty sandwiches. Desserts and Sweets  Regular (salted) desserts, pie, commercial fruit snack pies, commercial snack cakes, canned puddings.  Eat desserts and sweets in moderation. Fats and Oils  Gravy mixes or canned gravy. No more than 1 to 2 tbs of salad dressing. Chip dips.  Eat fats and oils in moderation. Beverages  See those listed under the vegetables and milk groups. Condiments  Ketchup, mustard, meat sauces, salsa, regular (salted) and lite soy sauce or mustard. Dill pickles, olives, meat tenderizer. Prepared horseradish or pickle relish. Dutch-processed cocoa. Baking powder or baking soda used medicinally. Worcestershire sauce. "Light" salt. Salt substitute, unless approved by your caregiver. Document Released: 01/22/2002 Document Revised: 10/25/2011 Document Reviewed: 08/25/2009 [facility] Patient Information 2014 [place], [place].

## 2013-09-04 NOTE — Progress Notes (Signed)
Pre visit review using our clinic review tool, if applicable. No additional management support is needed unless otherwise documented below in the visit note. 

## 2013-09-04 NOTE — Assessment & Plan Note (Signed)
Here for BP followup, good medication compliance without apparent side effects. BP today is 161/75, at home is never more than 147. DBP in the 50s. I would like to see her systolic BP in the 962I, we talked about switching amlodipine to Procardia XL which would allow me to increase her beta blockers but  at this time we decided to work on diet, exercise and recheck in 3 months. See instructions meds refilled  We had extensive discussion about diet  and exercise

## 2013-09-04 NOTE — Progress Notes (Signed)
   Subjective:    Patient ID: Katherine Koch, female    DOB: 31-Jul-1946, 68 y.o.   MRN: [id number]  HPI Followup, hypertension Good compliance with meds, needs refills. Ambulatory SBPs   between 140 and 147, very rarely goes to the 841L ;  diastolic BP always in the 50s. Also developed a itchy rash at the right arm, already getting better.  Past Medical History  Diagnosis Date  . Hyperlipemia   . Hypertension   . Osteopenia     per pt had a DEXA few years ago  . Allergic rhinitis   . Shingles 3/10  . Dry eye syndrome     Past Surgical History  Procedure Laterality Date  . Breast lumpectomy      fibrocystic  . Abdominal hysterectomy      oophorectomy- remotely   Review of Systems Unable to exercise much due to to knee pain. Not following a low-salt diet Denies chest pain, difficulty breathing no lower extremity edema.     Objective:   Physical Exam BP 161/75  Pulse 59  Temp(Src) 98.6 F (37 C) (Oral)  Wt 164 lb (74.39 kg)  SpO2 99% General -- alert, well-developed, NAD.  Lungs -- normal respiratory effort, no intercostal retractions, no accessory muscle use, and normal breath sounds.  Heart-- normal rate, regular rhythm, no murmur.  Extremities-- no pretibial edema bilaterally Skin--  Hair follicles at  the right forearm seems slightly hyperpigmented , they are palpable, skin is dry, the dorsum of the right hand has some hyperpigmentation from where she had a ring like rash.  Neurologic--  alert & oriented X3.  Psych-- Cognition and judgment appear intact. Cooperative with normal attention span and concentration. No anxious or depressed appearing.      Assessment & Plan:  Rash--  Very mild folliculitis and dry skin, recommended moisturizings for now.

## 2013-09-19 ENCOUNTER — Telehealth: Payer: Self-pay | Admitting: Internal Medicine

## 2013-09-19 NOTE — Telephone Encounter (Signed)
Relevant patient education mailed to patient.  

## 2013-10-18 ENCOUNTER — Ambulatory Visit (INDEPENDENT_AMBULATORY_CARE_PROVIDER_SITE_OTHER): Payer: Medicare Other | Admitting: Nurse Practitioner

## 2013-10-18 ENCOUNTER — Encounter: Payer: Self-pay | Admitting: Nurse Practitioner

## 2013-10-18 VITALS — BP 131/70 | HR 60 | Temp 98.0°F | Ht 66.5 in | Wt 170.0 lb

## 2013-10-18 DIAGNOSIS — J069 Acute upper respiratory infection, unspecified: Secondary | ICD-10-CM

## 2013-10-18 MED ORDER — BENZONATATE 100 MG PO CAPS: ORAL_CAPSULE | ORAL | Status: DC

## 2013-10-18 NOTE — Progress Notes (Signed)
Pre visit review using our clinic review tool, if applicable. No additional management support is needed unless otherwise documented below in the visit note. 

## 2013-10-18 NOTE — Patient Instructions (Signed)
You have a cold virus causing your symptoms. The average duration of cold symptoms is 14 days. Start daily sinus rinses (neilmed Sinus Rinse). Use benzonatate capsules for cough. Sip fluids every hour. Rest. If you are not feeling better in 1 week or develop fever or chest pain, call us for re-evaluation. Feel better!  Upper Respiratory Infection, Adult An upper respiratory infection (URI) is also sometimes known as the common cold. The upper respiratory tract includes the nose, sinuses, throat, trachea, and bronchi. Bronchi are the airways leading to the lungs. Most people improve within 1 week, but symptoms can last up to 2 weeks. A residual cough may last even longer.  CAUSES Many different viruses can infect the tissues lining the upper respiratory tract. The tissues become irritated and inflamed and often become very moist. Mucus production is also common. A cold is contagious. You can easily spread the virus to others by oral contact. This includes kissing, sharing a glass, coughing, or sneezing. Touching your mouth or nose and then touching a surface, which is then touched by another person, can also spread the virus. SYMPTOMS  Symptoms typically develop 1 to 3 days after you come in contact with a cold virus. Symptoms vary from person to person. They may include:  Runny nose.  Sneezing.  Nasal congestion.  Sinus irritation.  Sore throat.  Loss of voice (laryngitis).  Cough.  Fatigue.  Muscle aches.  Loss of appetite.  Headache.  Low-grade fever. DIAGNOSIS  You might diagnose your own cold based on familiar symptoms, since most people get a cold 2 to 3 times a year. Your caregiver can confirm this based on your exam. Most importantly, your caregiver can check that your symptoms are not due to another disease such as strep throat, sinusitis, pneumonia, asthma, or epiglottitis. Blood tests, throat tests, and X-rays are not necessary to diagnose a common cold, but they may  sometimes be helpful in excluding other more serious diseases. Your caregiver will decide if any further tests are required. RISKS AND COMPLICATIONS  You may be at risk for a more severe case of the common cold if you smoke cigarettes, have chronic heart disease (such as heart failure) or lung disease (such as asthma), or if you have a weakened immune system. The very young and very old are also at risk for more serious infections. Bacterial sinusitis, middle ear infections, and bacterial pneumonia can complicate the common cold. The common cold can worsen asthma and chronic obstructive pulmonary disease (COPD). Sometimes, these complications can require emergency medical care and may be life-threatening. PREVENTION  The best way to protect against getting a cold is to practice good hygiene. Avoid oral or hand contact with people with cold symptoms. Wash your hands often if contact occurs. There is no clear evidence that vitamin C, vitamin E, echinacea, or exercise reduces the chance of developing a cold. However, it is always recommended to get plenty of rest and practice good nutrition. TREATMENT  Treatment is directed at relieving symptoms. There is no cure. Antibiotics are not effective, because the infection is caused by a virus, not by bacteria. Treatment may include:  Increased fluid intake. Sports drinks offer valuable electrolytes, sugars, and fluids.  Breathing heated mist or steam (vaporizer or shower).  Eating chicken soup or other clear broths, and maintaining good nutrition.  Getting plenty of rest.  Using gargles or lozenges for comfort.  Controlling fevers with ibuprofen or acetaminophen as directed by your caregiver.  Increasing usage of your  inhaler if you have asthma. Zinc gel and zinc lozenges, taken in the first 24 hours of the common cold, can shorten the duration and lessen the severity of symptoms. Pain medicines may help with fever, muscle aches, and throat pain. A  variety of non-prescription medicines are available to treat congestion and runny nose. Your caregiver can make recommendations and may suggest nasal or lung inhalers for other symptoms.  HOME CARE INSTRUCTIONS   Only take over-the-counter or prescription medicines for pain, discomfort, or fever as directed by your caregiver.  Use a warm mist humidifier or inhale steam from a shower to increase air moisture. This may keep secretions moist and make it easier to breathe.  Drink enough water and fluids to keep your urine clear or pale yellow.  Rest as needed.  Return to work when your temperature has returned to normal or as your caregiver advises. You may need to stay home longer to avoid infecting others. You can also use a face mask and careful hand washing to prevent spread of the virus. SEEK MEDICAL CARE IF:   After the first few days, you feel you are getting worse rather than better.  You need your caregiver's advice about medicines to control symptoms.  You develop chills, worsening shortness of breath, or brown or red sputum. These may be signs of pneumonia.  You develop yellow or brown nasal discharge or pain in the face, especially when you bend forward. These may be signs of sinusitis.  You develop a fever, swollen neck glands, pain with swallowing, or white areas in the back of your throat. These may be signs of strep throat. SEEK IMMEDIATE MEDICAL CARE IF:   You have a fever.  You develop severe or persistent headache, ear pain, sinus pain, or chest pain.  You develop wheezing, a prolonged cough, cough up blood, or have a change in your usual mucus (if you have chronic lung disease).  You develop sore muscles or a stiff neck. Document Released: 01/26/2001 Document Revised: 10/25/2011 Document Reviewed: 12/04/2010 [facility] Patient Information 2014 [place], [place].

## 2013-10-18 NOTE — Progress Notes (Signed)
   Subjective:    Patient ID: Katherine Koch, female    DOB: 08/09/1946, 68 y.o.   MRN: [id number]  Cough This is a new problem. The current episode started in the past 7 days (4d). The problem has been unchanged. The problem occurs hourly. The cough is non-productive. Associated symptoms include chills, headaches and nasal congestion. Pertinent negatives include no chest pain, ear congestion, ear pain, fever, sore throat, shortness of breath or wheezing. The symptoms are aggravated by lying down. She has tried OTC cough suppressant for the symptoms. The treatment provided no relief.      Review of Systems  Constitutional: Positive for chills and fatigue. Negative for fever.  HENT: Positive for congestion. Negative for ear pain and sore throat.   Respiratory: Positive for cough. Negative for chest tightness, shortness of breath and wheezing.   Cardiovascular: Negative for chest pain.  Gastrointestinal: Negative for abdominal pain.  Musculoskeletal: Negative for back pain.  Neurological: Positive for headaches.       Objective:   Physical Exam  Vitals reviewed. Constitutional: She is oriented to person, place, and time. She appears well-developed and well-nourished. No distress.  HENT:  Head: Normocephalic and atraumatic.  Eyes: Conjunctivae are normal. Right eye exhibits no discharge. Left eye exhibits no discharge.  Neck: Normal range of motion. Neck supple. No thyromegaly present.  Cardiovascular: Normal rate, regular rhythm and normal heart sounds.   No murmur heard. Pulmonary/Chest: Effort normal and breath sounds normal. No respiratory distress. She has no wheezes. She has no rales.  Lymphadenopathy:    She has no cervical adenopathy.  Neurological: She is alert and oriented to person, place, and time.  Skin: Skin is warm and dry.  Psychiatric: She has a normal mood and affect. Her behavior is normal. Thought content normal.          Assessment & Plan:  1. Acute upper  respiratory infections of unspecified site 4d duration, afebrile. - benzonatate (TESSALON) 100 MG capsule; Take 1-2 capsules po up to 3 times daily PRN cough  Dispense: 60 capsule; Refill: 0 Sinus rinses. See pt instructions.

## 2013-11-03 ENCOUNTER — Other Ambulatory Visit: Payer: Self-pay | Admitting: Internal Medicine

## 2013-11-05 ENCOUNTER — Telehealth: Payer: Self-pay | Admitting: Internal Medicine

## 2013-11-05 ENCOUNTER — Other Ambulatory Visit: Payer: Self-pay

## 2013-11-05 DIAGNOSIS — M8580 Other specified disorders of bone density and structure, unspecified site: Secondary | ICD-10-CM

## 2013-11-05 NOTE — Telephone Encounter (Signed)
Left message of order entered

## 2013-11-05 NOTE — Telephone Encounter (Signed)
Patient called and stated that she needs a bone density order put in for her. She would like to got to the [facility] of [place].

## 2013-11-15 ENCOUNTER — Ambulatory Visit
Admission: RE | Admit: 2013-11-15 | Discharge: 2013-11-15 | Disposition: A | Discharge status: Home / Self Care | Payer: Medicare Other | Source: Ambulatory Visit | Attending: Internal Medicine | Admitting: Internal Medicine

## 2013-11-15 DIAGNOSIS — M8580 Other specified disorders of bone density and structure, unspecified site: Secondary | ICD-10-CM

## 2013-11-19 ENCOUNTER — Encounter: Payer: Self-pay | Admitting: *Deleted

## 2013-12-04 ENCOUNTER — Ambulatory Visit: Payer: Medicare Other | Admitting: Internal Medicine

## 2013-12-04 ENCOUNTER — Ambulatory Visit (INDEPENDENT_AMBULATORY_CARE_PROVIDER_SITE_OTHER): Payer: Medicare Other | Admitting: Internal Medicine

## 2013-12-04 ENCOUNTER — Encounter: Payer: Self-pay | Admitting: Internal Medicine

## 2013-12-04 VITALS — BP 145/80 | HR 64 | Temp 97.9°F | Wt 169.0 lb

## 2013-12-04 DIAGNOSIS — R102 Pelvic and perineal pain: Secondary | ICD-10-CM

## 2013-12-04 DIAGNOSIS — I10 Essential (primary) hypertension: Secondary | ICD-10-CM

## 2013-12-04 DIAGNOSIS — R109 Unspecified abdominal pain: Secondary | ICD-10-CM

## 2013-12-04 LAB — URINALYSIS, ROUTINE W REFLEX MICROSCOPIC
Bilirubin Urine: NEGATIVE
HGB URINE DIPSTICK: NEGATIVE
Ketones, ur: NEGATIVE
Leukocytes, UA: NEGATIVE
NITRITE: NEGATIVE
Specific Gravity, Urine: 1.015 (ref 1.000–1.030)
Total Protein, Urine: NEGATIVE
UROBILINOGEN UA: 0.2 (ref 0.0–1.0)
Urine Glucose: NEGATIVE
pH: 7 (ref 5.0–8.0)

## 2013-12-04 NOTE — Assessment & Plan Note (Signed)
Hypertension, has improved her lifestyle, ambulatory BP satisfactory, no change.

## 2013-12-04 NOTE — Patient Instructions (Signed)
Leave a urine sample at the lab before you leave   Next visit is for a physical exam in 3 months , fasting Please make an appointment

## 2013-12-04 NOTE — Progress Notes (Signed)
Pre visit review using our clinic review tool, if applicable. No additional management support is needed unless otherwise documented below in the visit note. 

## 2013-12-04 NOTE — Progress Notes (Signed)
Subjective:    Patient ID: Katherine Koch, female    DOB: 1946-02-13, 68 y.o.   MRN: [id number]  DOS:  12/04/2013 Type of  visit: followup from previous visit BP was a slightly elevated, working on her lifestyle, ambulatory BPs range from 117, 130. Good compliance with all medications.   ROS In general feeling well. Last week had a single episode of severe suprapubic and right lower quadrant discomfort with urination. No further symptoms. Denies fever, chills. No nausea vomiting or flank pain. No gross hematuria.   Past Medical History  Diagnosis Date  . Hyperlipemia   . Hypertension   . Osteopenia     per pt had a DEXA few years ago  . Allergic rhinitis   . Shingles 3/10  . Dry eye syndrome     Past Surgical History  Procedure Laterality Date  . Breast lumpectomy      fibrocystic  . Abdominal hysterectomy      oophorectomy- remotely    History   Social History  . Marital Status: Single    Spouse Name: N/A    Number of Children: 1  . Years of Education: N/A   Occupational History  . retired     Lawyer   Social History Main Topics  . Smoking status: Former Research scientist (life sciences)  . Smokeless tobacco: Not on file     Comment: quit 2011  . Alcohol Use: No  . Drug Use: No  . Sexual Activity: Not on file   Other Topics Concern  . 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  Always use your most recent med list.               amLODipine 10 MG tablet  Commonly known as:  NORVASC  Take 1 tablet (10 mg total) by mouth daily.     aspirin 81 MG tablet  Take 81 mg by mouth daily.     benzonatate 100 MG capsule  Commonly known as:  TESSALON  Take 1-2 capsules po up to 3 times daily PRN cough     CALTRATE 600+D PO  Take by mouth.     fluticasone 50 MCG/ACT nasal spray  Commonly known as:   FLONASE  Place 2 sprays into the nose daily.     hydrochlorothiazide 25 MG tablet  Commonly known as:  HYDRODIURIL  Take 1 tablet (25 mg total) by mouth daily.     losartan 100 MG tablet  Commonly known as:  COZAAR  Take 1 tablet (100 mg total) by mouth daily.     metoprolol 50 MG tablet  Commonly known as:  LOPRESSOR  TAKE 1 1/2 TABLETS BY MOUTH TWICE DAILY     pravastatin 40 MG tablet  Commonly known as:  PRAVACHOL  Take 1 tablet (40 mg total) by mouth daily.           Objective:   Physical Exam BP 145/80  Pulse 64  Temp(Src) 97.9 F (36.6 C)  Wt 169 lb (76.658 kg)  SpO2 97%  General -- alert, well-developed, NAD.  Abdomen-- Not distended, good bowel sounds,soft, non-tender. No CVA tenderness  Extremities-- no pretibial edema bilaterally  Psych-- Cognition and judgment appear  intact. Cooperative with normal attention span and concentration. No anxious or depressed appearing.       Assessment & Plan:   Suprapubic pain, she may have passed a kidney stone, will check a UA, if she has further symptoms she is to let me know.

## 2014-01-04 ENCOUNTER — Encounter: Payer: Self-pay | Admitting: Internal Medicine

## 2014-01-04 ENCOUNTER — Ambulatory Visit (INDEPENDENT_AMBULATORY_CARE_PROVIDER_SITE_OTHER): Payer: Medicare Other | Admitting: Internal Medicine

## 2014-01-04 VITALS — BP 138/78 | HR 63 | Temp 98.3°F | Wt 171.0 lb

## 2014-01-04 DIAGNOSIS — L509 Urticaria, unspecified: Secondary | ICD-10-CM

## 2014-01-04 NOTE — Progress Notes (Signed)
Pre-visit discussion using our clinic review tool. No additional management support is needed unless otherwise documented below in the visit note.  

## 2014-01-04 NOTE — Patient Instructions (Signed)
Take Claritin 10 mg OTC one tablet daily If the symptoms resurface let us know If the symptoms are severe and involve ther face or tongue: Go to the ER  Next visit by 02-2014

## 2014-01-04 NOTE — Progress Notes (Signed)
Subjective:    Patient ID: Katherine Koch, female    DOB: [birth year], 68 y.o.   MRN: [id number]  DOS:  01/04/2014 Type of  visit: Acute visit Woke up this morning with a rash (blotches) on the upper chest bilaterally and both arms, Not particularly itchy. The only different thing that is doing is she use a new soap when she took a bath  She is worried about shingles.  ROS  Denies fever or chills No difficulty breathing or cough, no lip-tongue swelling   Past Medical History  Diagnosis Date  . Hyperlipemia   . Hypertension   . Osteopenia     per pt had a DEXA few years ago  . Allergic rhinitis   . Shingles 3/10  . Dry eye syndrome     Past Surgical History  Procedure Laterality Date  . Breast lumpectomy      fibrocystic  . Abdominal hysterectomy      oophorectomy- remotely    History   Social History  . Marital Status: Single    Spouse Name: N/A    Number of Children: 1  . Years of Education: N/A   Occupational History  . retired     Lawyer   Social History Main Topics  . Smoking status: Former Research scientist (life sciences)  . Smokeless tobacco: Not on file     Comment: quit 2011  . Alcohol Use: No  . Drug Use: No  . Sexual Activity: Not on file   Other Topics Concern  . 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  Always use your most recent med list.               amLODipine 10 MG tablet  Commonly known as:  NORVASC  Take 1 tablet (10 mg total) by mouth daily.     aspirin 81 MG tablet  Take 81 mg by mouth daily.     CALTRATE 600+D PO  Take by mouth.     fluticasone 50 MCG/ACT nasal spray  Commonly known as:  FLONASE  Place 2 sprays into the nose daily.     hydrochlorothiazide 25 MG tablet  Commonly known as:  HYDRODIURIL  Take 1 tablet (25 mg total) by mouth daily.     losartan 100  MG tablet  Commonly known as:  COZAAR  Take 1 tablet (100 mg total) by mouth daily.     metoprolol 50 MG tablet  Commonly known as:  LOPRESSOR  TAKE 1 1/2 TABLETS BY MOUTH TWICE DAILY     pravastatin 40 MG tablet  Commonly known as:  PRAVACHOL  Take 1 tablet (40 mg total) by mouth daily.           Objective:   Physical Exam BP 138/78  Pulse 63  Temp(Src) 98.3 F (36.8 C) (Oral)  Wt 171 lb (77.565 kg)  SpO2 99% General -- alert, well-developed, NAD.   HEENT-- Not pale.  Lip and tongue wnl Skin-- normal to inspection and palpation at the upper chest and arms Extremities-- no pretibial edema bilaterally  Neurologic--  alert & oriented X3. Speech normal, gait normal, strength normal in all extremities.  Psych-- Cognition and judgment appear intact. Cooperative  with normal attention span and concentration. No anxious or depressed appearing.       Assessment & Plan:    Hives,  She probably had hives this morning, for now we'll use claritin, avoid the new soap for 2 weeks. Will call if symptoms resurface, ER if symptoms severe. Patient concerned about shingles, no evidence of shingles at this time

## 2014-02-27 ENCOUNTER — Encounter: Payer: Medicare Other | Admitting: Internal Medicine

## 2014-02-28 ENCOUNTER — Other Ambulatory Visit: Payer: Self-pay | Admitting: Internal Medicine

## 2014-03-06 ENCOUNTER — Other Ambulatory Visit: Payer: Self-pay | Admitting: Internal Medicine

## 2014-03-21 ENCOUNTER — Ambulatory Visit (INDEPENDENT_AMBULATORY_CARE_PROVIDER_SITE_OTHER): Payer: Medicare Other | Admitting: Internal Medicine

## 2014-03-21 ENCOUNTER — Encounter: Payer: Self-pay | Admitting: Internal Medicine

## 2014-03-21 VITALS — BP 149/66 | HR 57 | Temp 98.2°F | Ht 68.0 in | Wt 169.2 lb

## 2014-03-21 DIAGNOSIS — M949 Disorder of cartilage, unspecified: Secondary | ICD-10-CM

## 2014-03-21 DIAGNOSIS — R21 Rash and other nonspecific skin eruption: Secondary | ICD-10-CM

## 2014-03-21 DIAGNOSIS — I10 Essential (primary) hypertension: Secondary | ICD-10-CM

## 2014-03-21 DIAGNOSIS — M899 Disorder of bone, unspecified: Secondary | ICD-10-CM

## 2014-03-21 DIAGNOSIS — Z0000 Encounter for general adult medical examination without abnormal findings: Secondary | ICD-10-CM

## 2014-03-21 DIAGNOSIS — E785 Hyperlipidemia, unspecified: Secondary | ICD-10-CM

## 2014-03-21 MED ORDER — CLONIDINE HCL 0.1 MG PO TABS: 0.1000 mg | ORAL_TABLET | Freq: Two times a day (BID) | ORAL | Status: DC

## 2014-03-21 NOTE — Progress Notes (Signed)
Subjective:    Patient ID: Katherine Koch, female    DOB: Feb 08, 1946, 68 y.o.   MRN: [id number]  DOS:  03/21/2014 Type of visit - description:   Here for Medicare AWV:   1. Risk factors based on Past M, S, F history: reviewed   2. Physical Activities: used to go the gym , no recently , plans to go back   3. Depression/mood: No problemss noted or reported   4. Hearing: no problemss noted or reported 5. ADL's:independent   6. Fall Risk: no recent falls , prevention discussed   7. home Safety: does feelsafe at home   8. Height, weight, &visual acuity: see VS, sees ophthalmology regulalrly   9. Counseling: provided   10. Labs ordered based on risk factors: if needed   11. Referral Coordination: if needed   12. Care Plan, see assessment and plan   13. Cognitive Assessment: Cognition and motor skills within normal   In addition, today we discussed the following: Hypertension, good medication compliance, ambulatory BPs 140s/81. High cholesterol, good medication compliance, no apparent side effects Continue with a rash on the forehead, concerned about it 3 weeks ago developed some pain and swelling at the flexor area  of the L elbow. Denies any injury, redness.      ROS No  CP, SOB No palpitations, no lower extremity edema Denies  nausea, vomiting diarrhea, blood in the stools (-) cough, sputum production (-) wheezing, chest congestion No dysuria, gross hematuria, difficulty urinating  No vaginal discharge or bleed, genital rash     Past Medical History  Diagnosis Date  . Hyperlipemia   . Hypertension   . Osteopenia     per pt had a DEXA few years ago  . Allergic rhinitis   . Shingles 3/10  . Dry eye syndrome     Past Surgical History  Procedure Laterality Date  . Breast lumpectomy      fibrocystic  . Abdominal hysterectomy      oophorectomy- remotely    History   Social History  . Marital Status: Single    Spouse Name: N/A    Number of Children: 1  . Years of  Education: N/A   Occupational History  . retired     Lawyer   Social History Main Topics  . Smoking status: Former Research scientist (life sciences)  . Smokeless tobacco: Never Used     Comment: quit 2011  . Alcohol Use: No  . Drug Use: No  . Sexual Activity: Not on file   Other Topics Concern  . 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 Coronary artery disease Mother     onset?  . Diabetes Neg Hx   . Stroke Neg Hx   . Hypertension Mother   . Hypertension Brother   . Colon cancer Neg Hx   . Breast cancer Neg Hx   . Lymphoma Mother     deceased non-hodgkins        Medication List       This list is accurate as of: 03/21/14  5:59 PM.  Always use your most recent med list.               amLODipine 10 MG tablet  Commonly known as:  NORVASC  TAKE 1 TABLET BY MOUTH EVERY DAY     aspirin 81 MG tablet  Take 81 mg by mouth daily.     CALTRATE 600+D PO  Take by mouth.     cloNIDine 0.1 MG tablet  Commonly known as:  CATAPRES  Take 1 tablet (0.1 mg total) by mouth 2 (two) times daily.     fluticasone 50 MCG/ACT nasal spray  Commonly known as:  FLONASE  Place 2 sprays into the nose daily.     hydrochlorothiazide 25 MG tablet  Commonly known as:  HYDRODIURIL  TAKE ONE TABLET BY MOUTH ONCE DAILY     losartan 100 MG tablet  Commonly known as:  COZAAR  TAKE 1 TABLET BY MOUTH EVERY DAY     metoprolol 50 MG tablet  Commonly known as:  LOPRESSOR  TAKE 1 1/2 TABLETS BY MOUTH TWICE DAILY     pravastatin 40 MG tablet  Commonly known as:  PRAVACHOL  Take 1 tablet (40 mg total) by mouth daily.           Objective:   Physical Exam  Musculoskeletal:       Arms:  BP 149/66  Pulse 57  Temp(Src) 98.2 F (36.8 C) (Oral)  Ht 5\' 8"  (1.727 m)  Wt 169 lb 4 oz (76.771 kg)  BMI 25.74 kg/m2  SpO2 98% General -- alert, well-developed, NAD.    Neck --no thyromegaly , normal carotid pulse  HEENT-- Not pale.   Lungs -- normal respiratory effort, no intercostal retractions, no accessory muscle use, and normal breath sounds.  Heart-- normal rate, regular rhythm, no murmur.  Abdomen-- Not distended, good bowel sounds,soft, non-tender.  Extremities-- no pretibial edema bilaterally  Neurologic--  alert & oriented X3. Speech normal, gait appropriate for age, strength symmetric and appropriate for age.  Psych-- Cognition and judgment appear intact. Cooperative with normal attention span and concentration. No anxious or depressed appearing.        Assessment & Plan:   Rash-- to derm  Arm mass-- ?lipoma, rec to RTC 3 months  , if persistent, worse will need referral to gen surgery for further eval

## 2014-03-21 NOTE — Assessment & Plan Note (Signed)
Bone density test-2013 show a T score of -1.1 Patient was recommended to stay active, take calcium and vitamin

## 2014-03-21 NOTE — Assessment & Plan Note (Addendum)
The patient brought her BP machine, we checked and it seems to be accurate. Ambulatory BPs in the 140s/80. Will get labs   EKG--Sinus rhythm, T wave inversions laterally, LVH. Compared to previous EKGs, T wave inversions are new. Patient is chest pain-free. Will discuss w/ cards  Will try to get better BPcontroll; Continue amlodipine, losartan, Lopressor, hydrochlorothiazide. Add clonidine 0.1 twice a day ECHO Return to the office in one month Addendum-- Discuss with cardiology, they agreed with get better BP control and get a ECHO; based on the EKG, no need for a stress test at this point. JP

## 2014-03-21 NOTE — Assessment & Plan Note (Signed)
Due for labs, good medication compliance

## 2014-03-21 NOTE — Assessment & Plan Note (Signed)
Td 4-10 pneumonia shot declined again had shingles 3-11, again not interested on zostavax   Does not see gyn anymore----> told by her gyn that doesn't need further PAPs  (last PAP ~2008), discussed issue w/ pt, so far she decided no further paps mammogram 07-2013   negative, breast exam normal 2014 colonoscopy per patient aprox 2004, again 5-10---> neg, next 10 years  Encouraged to go back to a healthy diet and routine exercise

## 2014-03-21 NOTE — Patient Instructions (Addendum)
Come back fasting: CMP CBC -- dx hypertension FLP --- dx hyperlipidemia  Add clonidine twice a day Check the  blood pressure 2 or 3 times a month   be sure it is between 110/60 and 140/85. Ideal blood pressure is 120/80. If it is consistently higher or lower, let me know  Next visit is for routine check up regards your BP in 60months  No need to come back fasting Please make an appointment     Fall Prevention and Home Safety Falls cause injuries and can affect all age groups. It is possible to use preventive measures to significantly decrease the likelihood of falls. There are many simple measures which can make your home safer and prevent falls. OUTDOORS  Repair cracks and edges of walkways and driveways.  Remove high doorway thresholds.  Trim shrubbery on the main path into your home.  Have good outside lighting.  Clear walkways of tools, rocks, debris, and clutter.  Check that handrails are not broken and are securely fastened. Both sides of steps should have handrails.  Have leaves, snow, and ice cleared regularly.  Use sand or salt on walkways during winter months.  In the garage, clean up grease or oil spills. BATHROOM  Install night lights.  Install grab bars by the toilet and in the tub and shower.  Use non-skid mats or decals in the tub or shower.  Place a plastic non-slip stool in the shower to sit on, if needed.  Keep floors dry and clean up all water on the floor immediately.  Remove soap buildup in the tub or shower on a regular basis.  Secure bath mats with non-slip, double-sided rug tape.  Remove throw rugs and tripping hazards from the floors. BEDROOMS  Install night lights.  Make sure a bedside light is easy to reach.  Do not use oversized bedding.  Keep a telephone by your bedside.  Have a firm chair with side arms to use for getting dressed.  Remove throw rugs and tripping hazards from the floor. KITCHEN  Keep handles on pots and pans  turned toward the center of the stove. Use back burners when possible.  Clean up spills quickly and allow time for drying.  Avoid walking on wet floors.  Avoid hot utensils and knives.  Position shelves so they are not too high or low.  Place commonly used objects within easy reach.  If necessary, use a sturdy step stool with a grab bar when reaching.  Keep electrical cables out of the way.  Do not use floor polish or wax that makes floors slippery. If you must use wax, use non-skid floor wax.  Remove throw rugs and tripping hazards from the floor. STAIRWAYS  Never leave objects on stairs.  Place handrails on both sides of stairways and use them. Fix any loose handrails. Make sure handrails on both sides of the stairways are as long as the stairs.  Check carpeting to make sure it is firmly attached along stairs. Make repairs to worn or loose carpet promptly.  Avoid placing throw rugs at the top or bottom of stairways, or properly secure the rug with carpet tape to prevent slippage. Get rid of throw rugs, if possible.  Have an electrician put in a light switch at the top and bottom of the stairs. OTHER FALL PREVENTION TIPS  Wear low-heel or rubber-soled shoes that are supportive and fit well. Wear closed toe shoes.  When using a stepladder, make sure it is fully opened and both  spreaders are firmly locked. Do not climb a closed stepladder.  Add color or contrast paint or tape to grab bars and handrails in your home. Place contrasting color strips on first and last steps.  Learn and use mobility aids as needed. Install an electrical emergency response system.  Turn on lights to avoid dark areas. Replace light bulbs that burn out immediately. Get light switches that glow.  Arrange furniture to create clear pathways. Keep furniture in the same place.  Firmly attach carpet with non-skid or double-sided tape.  Eliminate uneven floor surfaces.  Select a carpet pattern that  does not visually hide the edge of steps.  Be aware of all pets. OTHER HOME SAFETY TIPS  Set the water temperature for 120 F (48.8 C).  Keep emergency numbers on or near the telephone.  Keep smoke detectors on every level of the home and near sleeping areas. Document Released: 07/23/2002 Document Revised: 02/01/2012 Document Reviewed: 10/22/2011 [facility] Patient Information 2015 [place], [place]. This information is not intended to replace advice given to you by your health care provider. Make sure you discuss any questions you have with your health care provider.

## 2014-03-22 ENCOUNTER — Other Ambulatory Visit: Payer: Medicare Other | Admitting: Internal Medicine

## 2014-03-22 ENCOUNTER — Other Ambulatory Visit (INDEPENDENT_AMBULATORY_CARE_PROVIDER_SITE_OTHER): Payer: Medicare Other

## 2014-03-22 DIAGNOSIS — I10 Essential (primary) hypertension: Secondary | ICD-10-CM

## 2014-03-22 DIAGNOSIS — E785 Hyperlipidemia, unspecified: Secondary | ICD-10-CM

## 2014-03-22 LAB — LIPID PANEL
CHOLESTEROL: 221 mg/dL — AB (ref 0–200)
HDL: 40.1 mg/dL (ref >39.00)
LDL CALC: 148 mg/dL — AB (ref 0–99)
NonHDL: 180.9
TRIGLYCERIDES: 164 mg/dL — AB (ref 0.0–149.0)
Total CHOL/HDL Ratio: 6
VLDL: 32.8 mg/dL (ref 0.0–40.0)

## 2014-03-22 LAB — COMPREHENSIVE METABOLIC PANEL
ALT: 25 U/L (ref 0–35)
AST: 26 U/L (ref 0–37)
Albumin: 4.1 g/dL (ref 3.5–5.2)
Alkaline Phosphatase: 54 U/L (ref 39–117)
BILIRUBIN TOTAL: 1 mg/dL (ref 0.2–1.2)
BUN: 15 mg/dL (ref 6–23)
CO2: 29 meq/L (ref 19–32)
CREATININE: 0.8 mg/dL (ref 0.4–1.2)
Calcium: 9.3 mg/dL (ref 8.4–10.5)
Chloride: 102 mEq/L (ref 96–112)
GFR: 76.9 mL/min (ref >60.00)
GLUCOSE: 90 mg/dL (ref 70–99)
Potassium: 3.4 mEq/L — ABNORMAL LOW (ref 3.5–5.1)
SODIUM: 138 meq/L (ref 135–145)
TOTAL PROTEIN: 7.1 g/dL (ref 6.0–8.3)

## 2014-03-22 LAB — CBC WITH DIFFERENTIAL/PLATELET
Basophils Absolute: 0 10*3/uL (ref 0.0–0.1)
Basophils Relative: 0.7 % (ref 0.0–3.0)
EOS PCT: 4.3 % (ref 0.0–5.0)
Eosinophils Absolute: 0.1 10*3/uL (ref 0.0–0.7)
HEMATOCRIT: 38.4 % (ref 36.0–46.0)
Hemoglobin: 12.7 g/dL (ref 12.0–15.0)
LYMPHS ABS: 1 10*3/uL (ref 0.7–4.0)
LYMPHS PCT: 30.5 % (ref 12.0–46.0)
MCHC: 32.9 g/dL (ref 30.0–36.0)
MCV: 88.4 fl (ref 78.0–100.0)
MONOS PCT: 8 % (ref 3.0–12.0)
Monocytes Absolute: 0.3 10*3/uL (ref 0.1–1.0)
Neutro Abs: 1.9 10*3/uL (ref 1.4–7.7)
Neutrophils Relative %: 56.5 % (ref 43.0–77.0)
PLATELETS: 239 10*3/uL (ref 150.0–400.0)
RBC: 4.35 Mil/uL (ref 3.87–5.11)
RDW: 13.9 % (ref 11.5–15.5)
WBC: 3.4 10*3/uL — AB (ref 4.0–10.5)

## 2014-03-26 ENCOUNTER — Encounter: Payer: Self-pay | Admitting: *Deleted

## 2014-03-28 ENCOUNTER — Ambulatory Visit (HOSPITAL_COMMUNITY): Payer: Medicare Other | Attending: Internal Medicine | Admitting: Radiology

## 2014-03-28 DIAGNOSIS — I10 Essential (primary) hypertension: Secondary | ICD-10-CM | POA: Diagnosis not present

## 2014-03-28 DIAGNOSIS — R609 Edema, unspecified: Secondary | ICD-10-CM | POA: Insufficient documentation

## 2014-03-28 DIAGNOSIS — I359 Nonrheumatic aortic valve disorder, unspecified: Secondary | ICD-10-CM | POA: Diagnosis not present

## 2014-03-28 DIAGNOSIS — E785 Hyperlipidemia, unspecified: Secondary | ICD-10-CM | POA: Insufficient documentation

## 2014-03-28 DIAGNOSIS — Z87891 Personal history of nicotine dependence: Secondary | ICD-10-CM | POA: Insufficient documentation

## 2014-03-28 DIAGNOSIS — I059 Rheumatic mitral valve disease, unspecified: Secondary | ICD-10-CM | POA: Diagnosis not present

## 2014-03-28 DIAGNOSIS — R9431 Abnormal electrocardiogram [ECG] [EKG]: Secondary | ICD-10-CM

## 2014-03-28 NOTE — Progress Notes (Signed)
Echocardiogram performed.  

## 2014-05-23 ENCOUNTER — Encounter: Payer: Self-pay | Admitting: Gastroenterology

## 2014-05-30 ENCOUNTER — Other Ambulatory Visit: Payer: Self-pay | Admitting: Internal Medicine

## 2014-06-04 ENCOUNTER — Encounter: Payer: Self-pay | Admitting: Internal Medicine

## 2014-06-04 ENCOUNTER — Ambulatory Visit (INDEPENDENT_AMBULATORY_CARE_PROVIDER_SITE_OTHER): Payer: Medicare Other | Admitting: Internal Medicine

## 2014-06-04 VITALS — BP 128/64 | HR 76 | Temp 97.4°F | Wt 170.2 lb

## 2014-06-04 DIAGNOSIS — I10 Essential (primary) hypertension: Secondary | ICD-10-CM

## 2014-06-04 MED ORDER — HYDROCHLOROTHIAZIDE 25 MG PO TABS: ORAL_TABLET | ORAL | Status: DC

## 2014-06-04 NOTE — Progress Notes (Signed)
Subjective:    Patient ID: Katherine Koch, female    DOB: 12-01-1945, 68 y.o.   MRN: [id number]  DOS:  06/04/2014 Type of visit - description : f/u Interval history: Sees her last office visit she is taking medications as prescribed. BP today is better but she is very fatigue since she started the clonidine ; also c/o dry mouth. B/c  fatigue she is unable to exercise and does not feel like paying attention to her diet.   ROS Denies chest pain, difficulty breathing No nausea, vomiting, diarrhea Denies depression or anxiety per se   Past Medical History  Diagnosis Date  . Hyperlipemia   . Hypertension   . Osteopenia     per pt had a DEXA few years ago  . Allergic rhinitis   . Shingles 3/10  . Dry eye syndrome     Past Surgical History  Procedure Laterality Date  . Breast lumpectomy      fibrocystic  . Abdominal hysterectomy      oophorectomy- remotely    History   Social History  . Marital Status: Single    Spouse Name: N/A    Number of Children: 1  . Years of Education: N/A   Occupational History  . retired      Lawyer   Social History Main Topics  . Smoking status: Former Research scientist (life sciences)  . Smokeless tobacco: Never Used     Comment: quit 2011  . Alcohol Use: No  . Drug Use: No  . Sexual Activity: Not on file   Other Topics Concern  . 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  Always use your most recent med list.               amLODipine 10 MG tablet  Commonly known as:  NORVASC  TAKE 1 TABLET BY MOUTH EVERY DAY     aspirin 81 MG tablet  Take 81 mg by mouth daily.     CALTRATE 600+D PO  Take by mouth.     fluticasone 50 MCG/ACT nasal spray  Commonly known as:  FLONASE  Place 2 sprays into the nose daily.     hydrochlorothiazide 25 MG tablet  Commonly known as:   HYDRODIURIL  TAKE ONE TABLET BY MOUTH ONCE DAILY     losartan 100 MG tablet  Commonly known as:  COZAAR  TAKE 1 TABLET BY MOUTH EVERY DAY     metoprolol 50 MG tablet  Commonly known as:  LOPRESSOR  TAKE 1 AND 1/2 TABLETS BY MOUTH TWICE DAILY     MULTIVITAMINS Chew  Chew 2 tablets by mouth daily.     pravastatin 40 MG tablet  Commonly known as:  PRAVACHOL  Take 1 tablet (40 mg total) by mouth daily.           Objective:   Physical Exam BP 128/64  Pulse 76  Temp(Src) 97.4 F (36.3 C) (Oral)  Wt 170 lb 4 oz (77.225 kg)  SpO2 98%  General -- alert, well-developed, NAD.   Lungs -- normal respiratory effort, no intercostal retractions, no accessory muscle use, and normal breath sounds.  Heart-- normal rate, regular rhythm, no murmur.  Extremities-- no pretibial edema bilaterally  Neurologic--  alert & oriented X3.   Psych-- Cognition and judgment appear intact. Cooperative with normal attention span and concentration. No anxious or depressed appearing.      Assessment & Plan:  Declined a flu shot

## 2014-06-04 NOTE — Progress Notes (Signed)
Pre visit review using our clinic review tool, if applicable. No additional management support is needed unless otherwise documented below in the visit note. 

## 2014-06-04 NOTE — Patient Instructions (Addendum)
Stop clonidine  Check the  blood pressure 2 or 3 times a week   Be sure your blood pressure is between  145/85 and 110/65.  if it is consistently higher or lower, let me know    Next visit 6 months , fasting

## 2014-06-04 NOTE — Assessment & Plan Note (Addendum)
BPs under excellent control today, unfortunately she is unable to tolerate clonidine. Plan: We'll continue amlodipine, hydrochlorothiazide, metoprolol and losartan Discontinue clonidine, watch BPs. Patient request a referral to [name] for further eval of  BP  --- will arrange Previously I set a BP goal of 120, she probably would be okay with a SBP in the 140s and consequently   may not need clonidine afterall.  We'll see what cardiology thinks

## 2014-07-08 ENCOUNTER — Institutional Professional Consult (permissible substitution): Payer: Medicare Other | Admitting: Cardiology

## 2014-08-22 ENCOUNTER — Encounter: Payer: Self-pay | Admitting: Cardiology

## 2014-08-22 ENCOUNTER — Ambulatory Visit (INDEPENDENT_AMBULATORY_CARE_PROVIDER_SITE_OTHER): Payer: Medicare Other | Admitting: Cardiology

## 2014-08-22 VITALS — BP 170/92 | HR 63 | Ht 68.0 in | Wt 173.0 lb

## 2014-08-22 DIAGNOSIS — G4733 Obstructive sleep apnea (adult) (pediatric): Secondary | ICD-10-CM

## 2014-08-22 DIAGNOSIS — I10 Essential (primary) hypertension: Secondary | ICD-10-CM

## 2014-08-22 MED ORDER — POTASSIUM CHLORIDE CRYS ER 20 MEQ PO TBCR: 20.0000 meq | EXTENDED_RELEASE_TABLET | Freq: Every day | ORAL | Status: DC

## 2014-08-22 MED ORDER — SPIRONOLACTONE 25 MG PO TABS: ORAL_TABLET | ORAL | Status: DC

## 2014-08-22 MED ORDER — CHLORTHALIDONE 25 MG PO TABS: 25.0000 mg | ORAL_TABLET | Freq: Every day | ORAL | Status: DC

## 2014-08-22 NOTE — Patient Instructions (Addendum)
Stop HCTZ (hydrochlorothiazide).  Start chlorthalidone 25mg  daily.  Start KCL (potassium) 20 mEq daily.  Start spironolactone 12.5mg  daily. This will be 1/2 of a 25mg  tablet daily.   Stay off clonidine for now.   Your physician recommends that you return for lab work in: about 10-14 days--BMET.  Your physician has requested that you regularly monitor and record your blood pressure readings at home. Please use the same machine at the same time of day to check your readings and record them. I will call you in about 2 weeks to get the readings.   Your physician recommends that you schedule a follow-up appointment in: 1 month with Dr Aundra Dubin.

## 2014-08-23 DIAGNOSIS — G4733 Obstructive sleep apnea (adult) (pediatric): Secondary | ICD-10-CM | POA: Insufficient documentation

## 2014-08-23 NOTE — Progress Notes (Signed)
Patient ID: Katherine Koch, female   DOB: 12/12/1945, 69 y.o.   MRN: [id number] PCP: [name]  69 yo with history of resistant HTN presents for evaluation of HTN.  She has had HTN for years.  She is on 5 antihypertensive agents.  HTN runs strongly in her family.  Recent echo showed normal EF and severe LVH, likely due to long-standing HTN.  She developed significant lethargy on clonidine.  She was on 0.1 bid initially then decreased it to 0.1 daily.  She stopped it a couple of days ago and lethargy has resolved.  BP 170/92 today.  SBP 130s-150s when she checks at home.    No exertional dyspnea or chest pain.  She tries to get regular exercise.  No palpitations, lightheadedness, orthopnea or PND.  She snores and sometimes gasps in her sleep.   Labs (8/15): K 3.4, creatinine 0.8, LDL 148, HDL 33  PMH: 1. OA knees 2. Hysterectomy 3. Hyperlipidemia 4. HTN: Lethargy with clonidine use.  Resistant HTN x years.  Echo (8/15) with EF 60-65%, severe LVH, grade II diastolic dysfunction, mild MR.   FH: Mother HTN, sister HTN  SH: Single, moved to [place] from [place] to live near daughter.  Retired Chief Financial Officer.  Prior smoker, quit 2011.   ROS: All systems reviewed and negative except as per HPI.   Current Outpatient Prescriptions  Medication Sig Dispense Refill  . amLODipine (NORVASC) 10 MG tablet TAKE 1 TABLET BY MOUTH EVERY DAY 90 tablet 1  . aspirin 81 MG tablet Take 81 mg by mouth daily.      . Calcium Carbonate-Vitamin D (CALTRATE 600+D PO) Take by mouth.      . chlorthalidone (HYGROTON) 25 MG tablet Take 1 tablet (25 mg total) by mouth daily. 30 tablet 1  . fluticasone (FLONASE) 50 MCG/ACT nasal spray Place 2 sprays into the nose daily. 16 g 6  . losartan (COZAAR) 100 MG tablet TAKE 1 TABLET BY MOUTH EVERY DAY 30 tablet 6  . metoprolol (LOPRESSOR) 50 MG tablet TAKE 1 AND 1/2 TABLETS BY MOUTH TWICE DAILY 270 tablet 1  . Multiple Vitamins-Minerals (MULTIVITAMINS) CHEW Chew 2 tablets by mouth daily.    .  potassium chloride SA (K-DUR,KLOR-CON) 20 MEQ tablet Take 1 tablet (20 mEq total) by mouth daily. 30 tablet 1  . pravastatin (PRAVACHOL) 40 MG tablet Take 1 tablet (40 mg total) by mouth daily. 90 tablet 1  . spironolactone (ALDACTONE) 25 MG tablet 1/2 tablet (12.5mg ) daily 15 tablet 1   No current facility-administered medications for this visit.   BP 170/92 mmHg  Pulse 63  Ht 5\' 8"  (1.727 m)  Wt 173 lb (78.472 kg)  BMI 26.31 kg/m2  SpO2 98%  General: NAD Neck: No JVD, no thyromegaly or thyroid nodule.  Lungs: Clear to auscultation bilaterally with normal respiratory effort. CV: Nondisplaced PMI.  Heart regular S1/S2, +S4, no murmur.  1+ ankle edema.  No carotid bruit.  Normal pedal pulses.  Abdomen: Soft, nontender, no hepatosplenomegaly, no distention.  Skin: Intact without lesions or rashes.  Neurologic: Alert and oriented x 3.  Psych: Normal affect. Extremities: No clubbing or cyanosis.  HEENT: Normal.   Assessment/Plan: 1. HTN: Resistant HTN.  BP continues to run high.  She did not tolerate clonidine due to lethargy.  LVH on echo likely due to poor control of HTN.  - Continue amlodipine and losartan.  - Stop HCTZ, start chlorthalidone 25 mg daily + KCl 20 daily.  BMET in 10 days.  -  Stay off clonidine. - Start spironolactone 12.5 daily.  - Next step will be to stop metoprolol and use labetalol or Coreg instead.  - She will check BP daily and we will call her with readings in 2 wks.  2. OSA: Suspected.  This may be potentiating HTN.  Will arrange for sleep study.    Followup in 1 month.   [name] 08/23/2014

## 2014-09-03 ENCOUNTER — Other Ambulatory Visit (INDEPENDENT_AMBULATORY_CARE_PROVIDER_SITE_OTHER): Payer: Medicare Other | Admitting: *Deleted

## 2014-09-03 DIAGNOSIS — I10 Essential (primary) hypertension: Secondary | ICD-10-CM

## 2014-09-03 LAB — BASIC METABOLIC PANEL
BUN: 15 mg/dL (ref 6–23)
CO2: 31 mEq/L (ref 19–32)
Calcium: 9.7 mg/dL (ref 8.4–10.5)
Chloride: 102 mEq/L (ref 96–112)
Creatinine, Ser: 0.81 mg/dL (ref 0.40–1.20)
GFR: 74.62 mL/min (ref >60.00)
Glucose, Bld: 111 mg/dL — ABNORMAL HIGH (ref 70–99)
Potassium: 3.6 mEq/L (ref 3.5–5.1)
Sodium: 137 mEq/L (ref 135–145)

## 2014-09-05 ENCOUNTER — Telehealth: Payer: Self-pay | Admitting: *Deleted

## 2014-09-05 NOTE — Telephone Encounter (Signed)
Pt.notified

## 2014-09-05 NOTE — Telephone Encounter (Signed)
Copied from Dr Claris Gladden 08/22/14 office note:  . HTN: Resistant HTN. BP continues to run high. She did not tolerate clonidine due to lethargy. LVH on echo likely due to poor control of HTN.  - Continue amlodipine and losartan.  - Stop HCTZ, start chlorthalidone 25 mg daily + KCl 20 daily. BMET in 10 days.  - Stay off clonidine. - Start spironolactone 12.5 daily.  - Next step will be to stop metoprolol and use labetalol or Coreg instead.  - She will check BP daily and we will call her with readings in 2 wks.

## 2014-09-05 NOTE — Telephone Encounter (Signed)
Besides the low heart rate reading last night pt tolerating medication without problems.  Pt states she has been going to the gym regularly and  is feeling better in general.   Pt advised I will forward to Dr Aundra Dubin for review.

## 2014-09-05 NOTE — Telephone Encounter (Signed)
Readings are better, no changes

## 2014-09-05 NOTE — Telephone Encounter (Signed)
09/05/14--recent BP readings   09/05/14--141/81 62 09/04/14--AM-145/84 after meds-121/64  11PM heart rate 49-did not take PM dose of metoprolol 09/03/14--117/73   114/67 130/65 09/02/14 126/72 118/61 09/01/14 122/70 08/31/14 141/79-(before meds)  124/66  122/66 08/30/14 132/77   127/70 122/70 08/29/14 129/73 130/74 120/62 08/28/14  124/71 123/66 132/70 08/27/14 109/66 131/71 136/65 119/65 08/26/14 144/80 -before meds   120/66  123/84 124/62 08/25/14  119/66 142/70

## 2014-09-27 ENCOUNTER — Other Ambulatory Visit: Payer: Self-pay | Admitting: Internal Medicine

## 2014-09-30 ENCOUNTER — Encounter: Payer: Self-pay | Admitting: Cardiology

## 2014-09-30 ENCOUNTER — Ambulatory Visit (INDEPENDENT_AMBULATORY_CARE_PROVIDER_SITE_OTHER): Payer: Medicare Other | Admitting: Cardiology

## 2014-09-30 VITALS — BP 146/82 | HR 61 | Ht 68.0 in | Wt 174.0 lb

## 2014-09-30 DIAGNOSIS — R0683 Snoring: Secondary | ICD-10-CM

## 2014-09-30 DIAGNOSIS — I10 Essential (primary) hypertension: Secondary | ICD-10-CM

## 2014-09-30 DIAGNOSIS — E785 Hyperlipidemia, unspecified: Secondary | ICD-10-CM

## 2014-09-30 MED ORDER — SPIRONOLACTONE 25 MG PO TABS: 25.0000 mg | ORAL_TABLET | Freq: Every day | ORAL | Status: DC

## 2014-09-30 NOTE — Patient Instructions (Signed)
Increase spironolactone to 25mg  daily.   Your physician recommends that you return for lab work in: 2 weeks--BMET.   Your physician has requested that you regularly monitor and record your blood pressure readings at home. Please use the same machine at the same time of day to check your readings and record them. I will call you in 2-3 weeks and get the readings.    Your physician has recommended that you have a sleep study. This test records several body functions during sleep, including: brain activity, eye movement, oxygen and carbon dioxide blood levels, heart rate and rhythm, breathing rate and rhythm, the flow of air through your mouth and nose, snoring, body muscle movements, and chest and belly movement.  Your physician recommends that you schedule a follow-up appointment in: 3 months with Dr Aundra Dubin.

## 2014-09-30 NOTE — Progress Notes (Signed)
Patient ID: Katherine Koch, female   DOB: 03-29-1946, 69 y.o.   MRN: [id number] PCP: [name]  69 yo with history of resistant HTN presents for followup of HTN.  She has had HTN for years. HTN runs strongly in her family.  Recent echo showed normal EF and severe LVH, likely due to long-standing HTN.  I have been adjusting her antihypertensive regimen.  Currently, SBP running 120s-140s.  BP 146/82 today. Overall, control is improving.    No exertional dyspnea or chest pain.  She tries to get regular exercise.  No palpitations, lightheadedness, orthopnea or PND.  She snores and sometimes gasps in her sleep. Some myalgias with statin.   Labs (8/15): K 3.4, creatinine 0.8, LDL 148, HDL 33 Labs (1/16): K 3.6, creatinine 0.81  PMH: 1. OA knees 2. Hysterectomy 3. Hyperlipidemia 4. HTN: Lethargy with clonidine use.  Resistant HTN x years.  Echo (8/15) with EF 60-65%, severe LVH, grade II diastolic dysfunction, mild MR.   FH: Mother HTN, sister HTN  SH: Single, moved to [place] from [place] to live near daughter.  Retired Chief Financial Officer.  Prior smoker, quit 2011.   ROS: All systems reviewed and negative except as per HPI.   Current Outpatient Prescriptions  Medication Sig Dispense Refill  . amLODipine (NORVASC) 10 MG tablet TAKE 1 TABLET BY MOUTH EVERY DAY 90 tablet 1  . aspirin 81 MG tablet Take 81 mg by mouth daily.      . Calcium Carbonate-Vitamin D (CALTRATE 600+D PO) Take by mouth.      . chlorthalidone (HYGROTON) 25 MG tablet Take 1 tablet (25 mg total) by mouth daily. 30 tablet 1  . fluticasone (FLONASE) 50 MCG/ACT nasal spray Place 2 sprays into the nose daily. (Patient taking differently: Place 2 sprays into the nose daily as needed. ) 16 g 6  . losartan (COZAAR) 100 MG tablet TAKE 1 TABLET BY MOUTH DAILY 30 tablet 5  . metoprolol (LOPRESSOR) 50 MG tablet TAKE 1 AND 1/2 TABLETS BY MOUTH TWICE DAILY 270 tablet 1  . Multiple Vitamins-Minerals (MULTIVITAMINS) CHEW Chew 2 tablets by mouth daily.    .  potassium chloride SA (K-DUR,KLOR-CON) 20 MEQ tablet Take 1 tablet (20 mEq total) by mouth daily. 30 tablet 1  . pravastatin (PRAVACHOL) 40 MG tablet Take 1 tablet (40 mg total) by mouth daily. 90 tablet 1  . spironolactone (ALDACTONE) 25 MG tablet Take 1 tablet (25 mg total) by mouth daily. 30 tablet 4   No current facility-administered medications for this visit.   BP 146/82 mmHg  Pulse 61  Ht 5\' 8"  (1.727 m)  Wt 174 lb (78.926 kg)  BMI 26.46 kg/m2  SpO2 98%  General: NAD Neck: No JVD, no thyromegaly or thyroid nodule.  Lungs: Clear to auscultation bilaterally with normal respiratory effort. CV: Nondisplaced PMI.  Heart regular S1/S2, +S4, no murmur.  No edema.  No carotid bruit.  Normal pedal pulses.  Abdomen: Soft, nontender, no hepatosplenomegaly, no distention.  Skin: Intact without lesions or rashes.  Neurologic: Alert and oriented x 3.  Psych: Normal affect. Extremities: No clubbing or cyanosis.  HEENT: Normal.   Assessment/Plan: 1. HTN: Resistant HTN.  She did not tolerate clonidine due to lethargy.  LVH on echo likely due to poor control of HTN.  Control is improving with changes at last appointment.  - Continue amlodipine, chlorthalidone, and losartan.  - Stay off clonidine. - Increase spironolactone to 25 mg daily with BMET in 2 wks.  - Next step if  needed will be to stop metoprolol and use labetalol or Coreg instead.  - She will check BP daily and we will call her with readings in 2 wks.  2. OSA: Suspected.  This may be potentiating HTN.  Will arrange for sleep study.   3. Hyperlipidemia: Some myalgias with use of pravastatin.  I think it would be fine to use coenzyme Q 10 200 mg daily (she asked about this today).  When lipids repeated, would be reasonable to try her on low-dose Crestor if LDL is still high.   Followup in 3 months.   [name] 09/30/2014

## 2014-10-15 ENCOUNTER — Other Ambulatory Visit: Payer: Self-pay | Admitting: Cardiology

## 2014-10-16 ENCOUNTER — Other Ambulatory Visit (INDEPENDENT_AMBULATORY_CARE_PROVIDER_SITE_OTHER): Payer: Medicare Other | Admitting: *Deleted

## 2014-10-16 DIAGNOSIS — R0683 Snoring: Secondary | ICD-10-CM

## 2014-10-16 DIAGNOSIS — I10 Essential (primary) hypertension: Secondary | ICD-10-CM

## 2014-10-16 LAB — BASIC METABOLIC PANEL
BUN: 17 mg/dL (ref 6–23)
CO2: 30 mEq/L (ref 19–32)
Calcium: 9.7 mg/dL (ref 8.4–10.5)
Chloride: 102 mEq/L (ref 96–112)
Creatinine, Ser: 0.79 mg/dL (ref 0.40–1.20)
GFR: 76.77 mL/min (ref >60.00)
GLUCOSE: 86 mg/dL (ref 70–99)
Potassium: 3.8 mEq/L (ref 3.5–5.1)
Sodium: 136 mEq/L (ref 135–145)

## 2014-10-17 ENCOUNTER — Telehealth: Payer: Self-pay | Admitting: *Deleted

## 2014-10-17 NOTE — Telephone Encounter (Signed)
Pt.notified

## 2014-10-17 NOTE — Telephone Encounter (Signed)
Copied from Dr Claris Gladden 09/30/14 office note:  HTN: Resistant HTN. She did not tolerate clonidine due to lethargy. LVH on echo likely due to poor control of HTN. Control is improving with changes at last appointment.  - Continue amlodipine, chlorthalidone, and losartan.  - Stay off clonidine. - Increase spironolactone to 25 mg daily with BMET in 2 wks.   BP readings starting 10/16/14-09/28/14 most recent to least recent:  133/76 AM  117/61 PM  132/80        115/62 130/77        109/62 125/79         121/66 130/82         103/66 120/68         111/65 118/66         116/65 139/80  139/80         124/69   126/80         116/72  Pt states she is tolerating spironolactone 25mg  daily without problems.  Pt advised I will forward to Dr Aundra Dubin for review.

## 2014-10-17 NOTE — Telephone Encounter (Signed)
BP much better

## 2014-10-17 NOTE — Telephone Encounter (Signed)
NA

## 2014-10-31 ENCOUNTER — Other Ambulatory Visit: Payer: Self-pay

## 2014-10-31 MED ORDER — PRAVASTATIN SODIUM 40 MG PO TABS: 40.0000 mg | ORAL_TABLET | Freq: Every day | ORAL | Status: DC

## 2014-11-20 ENCOUNTER — Other Ambulatory Visit: Payer: Self-pay

## 2014-11-22 ENCOUNTER — Telehealth: Payer: Self-pay | Admitting: Cardiology

## 2014-11-22 NOTE — Telephone Encounter (Signed)
Per pt:    Per pt BP monitered:  Pt feels a little light headed   4/8  127/79 before med. 9:30a      104/58  hr 66  1:05p  4/7   93/60  Hr 67   1:04p   109/62  Hr 60   3:30p        111/66  Hr 71   11:46p no meds yet.  3/24   1:30p   105/64 hr 60  3/21  12p 123/71  Hr 69  3/19  [facility]  1:53p   93/58  HR 63    9:15p   97/57  Hr 62    3/18   [facility]   2:30p  101/59  HR 59     6p    117/69  Hr 56    Pt would like to know if she should adjust her medication.   Please give her a call.

## 2014-11-22 NOTE — Telephone Encounter (Signed)
I spoke with the pt and the last change that was made in her BP medications was increasing Spironolactone to 25mg  daily. The pt has been monitoring her BP since this change and it is running low and she has developed light headedness. I advised the pt to decrease Spironolactone to 12.5mg  daily and I will forward this message to Dr Aundra Dubin to review.  The pt also said she had started going to the gym exercising.  I made her aware that she could be dehydrated and this could cause low BP and being light headedness. She will try to monitor her fluid intake.

## 2014-11-23 NOTE — Telephone Encounter (Signed)
Agree with decreasing spironolactone to 12.5 daily

## 2014-11-25 MED ORDER — SPIRONOLACTONE 25 MG PO TABS: 12.5000 mg | ORAL_TABLET | Freq: Every day | ORAL | Status: DC

## 2014-11-25 NOTE — Telephone Encounter (Signed)
Went ahead and made the medication change in the system for Spironolactone from 25 mg daily to 12.5 mg daily. Spoke with patient and she verbalized that she feels better on the 12.5 mg dose. Stated that she had a "good weekend". Denies current complaints or concerns.

## 2014-12-04 ENCOUNTER — Ambulatory Visit (HOSPITAL_BASED_OUTPATIENT_CLINIC_OR_DEPARTMENT_OTHER): Payer: Medicare Other

## 2014-12-06 ENCOUNTER — Encounter: Payer: Self-pay | Admitting: Internal Medicine

## 2014-12-06 ENCOUNTER — Ambulatory Visit (INDEPENDENT_AMBULATORY_CARE_PROVIDER_SITE_OTHER): Payer: Medicare Other | Admitting: Internal Medicine

## 2014-12-06 VITALS — BP 138/68 | HR 66 | Temp 97.5°F | Resp 16 | Wt 168.0 lb

## 2014-12-06 DIAGNOSIS — I10 Essential (primary) hypertension: Secondary | ICD-10-CM

## 2014-12-06 LAB — BASIC METABOLIC PANEL
BUN: 17 mg/dL (ref 6–23)
CO2: 31 meq/L (ref 19–32)
CREATININE: 1.01 mg/dL (ref 0.40–1.20)
Calcium: 10.5 mg/dL (ref 8.4–10.5)
Chloride: 102 mEq/L (ref 96–112)
GFR: 57.8 mL/min — ABNORMAL LOW (ref >60.00)
GLUCOSE: 106 mg/dL — AB (ref 70–99)
Potassium: 4.7 mEq/L (ref 3.5–5.1)
SODIUM: 137 meq/L (ref 135–145)

## 2014-12-06 MED ORDER — AMLODIPINE BESYLATE 10 MG PO TABS: 10.0000 mg | ORAL_TABLET | Freq: Every day | ORAL | Status: DC

## 2014-12-06 MED ORDER — POTASSIUM CHLORIDE CRYS ER 20 MEQ PO TBCR
20.0000 meq | EXTENDED_RELEASE_TABLET | Freq: Every day | ORAL | Status: DC
Start: 2014-12-06 — End: 2016-04-12

## 2014-12-06 MED ORDER — PRAVASTATIN SODIUM 40 MG PO TABS: 40.0000 mg | ORAL_TABLET | Freq: Every day | ORAL | Status: DC

## 2014-12-06 MED ORDER — LOSARTAN POTASSIUM 100 MG PO TABS: 100.0000 mg | ORAL_TABLET | Freq: Every day | ORAL | Status: DC

## 2014-12-06 MED ORDER — METOPROLOL TARTRATE 50 MG PO TABS: 75.0000 mg | ORAL_TABLET | Freq: Two times a day (BID) | ORAL | Status: DC

## 2014-12-06 NOTE — Progress Notes (Signed)
Pre visit review using our clinic review tool, if applicable. No additional management support is needed unless otherwise documented below in the visit note. 

## 2014-12-06 NOTE — Patient Instructions (Signed)
Get labs today Continue  monitoring your BPs Come back in 3 months for a physical exam

## 2014-12-06 NOTE — Progress Notes (Signed)
Subjective:    Patient ID: Katherine Koch, female    DOB: 12-Aug-1946, 69 y.o.   MRN: [id number]  DOS:  12/06/2014 Type of visit - description : rov Interval history: Since the last visit, she saw cardiology, note reviewed, spironolactone dose was increased. She did very well, in fact earlier this month she noted some dizziness, BP was in the low side, cardiology recommended to decrease dose of spironolactone to half tablet daily. Since then she feels better and ambulatory BPs are in the 120/60 range.   Review of Systems  Denies chest pain, difficulty breathing or lower extremity edema No nausea, vomiting, diarrhea Some allergies lately with nasal congestion. She admits to snoring but denies feeling fatigue or sleepy.  Past Medical History  Diagnosis Date  . Hyperlipemia   . Hypertension   . Osteopenia     per pt had a DEXA few years ago  . Allergic rhinitis   . Shingles 3/10  . Dry eye syndrome     Past Surgical History  Procedure Laterality Date  . Breast lumpectomy      fibrocystic  . Abdominal hysterectomy      oophorectomy- remotely    History   Social History  . Marital Status: Single    Spouse Name: N/A  . Number of Children: 1  . Years of Education: N/A   Occupational History  . retired      Lawyer   Social History Main Topics  . Smoking status: Former Research scientist (life sciences)  . Smokeless tobacco: Never Used     Comment: quit 2011  . Alcohol Use: No  . Drug Use: No  . Sexual Activity: Not on file   Other Topics Concern  . 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  Always use your most recent med list.               amLODipine 10 MG tablet  Commonly known as:  NORVASC  Take 1 tablet (10 mg total) by mouth daily.     aspirin 81 MG tablet  Take 81 mg by mouth daily.     CALTRATE  600+D PO  Take by mouth.     chlorthalidone 25 MG tablet  Commonly known as:  HYGROTON  TAKE 1 TABLET BY MOUTH DAILY     fluticasone 50 MCG/ACT nasal spray  Commonly known as:  FLONASE  Place 2 sprays into the nose daily.     losartan 100 MG tablet  Commonly known as:  COZAAR  Take 1 tablet (100 mg total) by mouth daily.     metoprolol 50 MG tablet  Commonly known as:  LOPRESSOR  Take 1.5 tablets (75 mg total) by mouth 2 (two) times daily.     MULTIVITAMINS Chew  Chew 2 tablets by mouth daily.     potassium chloride SA 20 MEQ tablet  Commonly known as:  K-DUR,KLOR-CON  Take 1 tablet (20 mEq total) by mouth daily.     pravastatin 40 MG tablet  Commonly known as:  PRAVACHOL  Take 1 tablet (40 mg total) by mouth daily.     spironolactone 25 MG tablet  Commonly known as:  ALDACTONE  Take 0.5 tablets (  12.5 mg total) by mouth daily.           Objective:   Physical Exam BP 138/68 mmHg  Pulse 66  Temp(Src) 97.5 F (36.4 C) (Oral)  Resp 16  Wt 168 lb (76.204 kg)  SpO2 99%  General:   Well developed, well nourished . NAD.  HEENT:  Normocephalic . Face symmetric, atraumatic Lungs:  CTA B Normal respiratory effort, no intercostal retractions, no accessory muscle use. Heart: RRR,  no murmur.  Muscle skeletal: no pretibial edema bilaterally  Skin: Not pale. Not jaundice Neurologic:  alert & oriented X3.  Speech normal, gait appropriate for age and unassisted Psych--  Cognition and judgment appear intact.  Cooperative with normal attention span and concentration.  Behavior appropriate. No anxious or depressed appearing.       Assessment & Plan:

## 2014-12-06 NOTE — Assessment & Plan Note (Addendum)
Since last visit, she saw cardiology 08-2014, they stop HCTZ, started chlorthalidone, KCl and spironolactone. Since then BP improve it actually when to low few days ago, now taking half spironolactone tablet than feeling very well. They also mention possibly switch metoprolol to labetalol or coreg if needed. BP today upon arrival to the office a slightly elevated but he was recheck: 138/68. They also suggested a sleep study but the patient is reluctant, does not like to "use the machine". Denies feeling sleepy. Plan: Refill meds, BMP, return to clinic in 3 months for a CPX

## 2015-01-02 ENCOUNTER — Ambulatory Visit (INDEPENDENT_AMBULATORY_CARE_PROVIDER_SITE_OTHER): Payer: Medicare Other | Admitting: Cardiology

## 2015-01-02 ENCOUNTER — Encounter: Payer: Self-pay | Admitting: Cardiology

## 2015-01-02 VITALS — BP 122/62 | HR 55 | Ht 68.0 in | Wt 163.0 lb

## 2015-01-02 DIAGNOSIS — I10 Essential (primary) hypertension: Secondary | ICD-10-CM

## 2015-01-02 NOTE — Patient Instructions (Signed)
Medication Instructions:  No changes today.  Labwork: None today  Testing/Procedures: None today  Follow-Up: 4 months with Dr Aundra Dubin.

## 2015-01-03 NOTE — Progress Notes (Signed)
Patient ID: Katherine Koch, female   DOB: 10-14-1945, 69 y.o.   MRN: [id number] PCP: [name]  69 yo with history of resistant HTN presents for followup of HTN.  She has had HTN for years. HTN runs strongly in her family.  Recent echo showed normal EF and severe LVH, likely due to long-standing HTN.  I have been adjusting her antihypertensive regimen and she has been exercising and losing weight.  BP was actually too low at one point recently with orthostasis, and I cut back on her spironolactone.  BP is now under good control both here and at home.   No exertional dyspnea or chest pain.  No palpitations, lightheadedness, orthopnea or PND.  She snores and sometimes gasps in her sleep. She goes to the gym 3 days a week and uses the elliptical for 50 minutes.   Labs (8/15): K 3.4, creatinine 0.8, LDL 148, HDL 33 Labs (1/16): K 3.6, creatinine 0.81 Labs (4/16): K 4.7, creatinine 1.01  ECG: NSR, LVH with repolarization abnormality  PMH: 1. OA knees 2. Hysterectomy 3. Hyperlipidemia 4. HTN: Lethargy with clonidine use.  Resistant HTN x years.  Echo (8/15) with EF 60-65%, severe LVH, grade II diastolic dysfunction, mild MR.   FH: Mother HTN, sister HTN  SH: Single, moved to [place] from [place] to live near daughter.  Retired Chief Financial Officer.  Prior smoker, quit 2011.   ROS: All systems reviewed and negative except as per HPI.   Current Outpatient Prescriptions  Medication Sig Dispense Refill  . amLODipine (NORVASC) 10 MG tablet Take 1 tablet (10 mg total) by mouth daily. 90 tablet 2  . aspirin 81 MG tablet Take 81 mg by mouth daily.      . Calcium Carbonate-Vitamin D (CALTRATE 600+D PO) Take by mouth.      . chlorthalidone (HYGROTON) 25 MG tablet TAKE 1 TABLET BY MOUTH DAILY 30 tablet 3  . fluticasone (FLONASE) 50 MCG/ACT nasal spray Place 2 sprays into the nose daily. (Patient taking differently: Place 2 sprays into the nose daily as needed. ) 16 g 6  . losartan (COZAAR) 100 MG tablet Take 1 tablet (100 mg  total) by mouth daily. 90 tablet 2  . metoprolol (LOPRESSOR) 50 MG tablet Take 1.5 tablets (75 mg total) by mouth 2 (two) times daily. 270 tablet 2  . Multiple Vitamins-Minerals (MULTIVITAMINS) CHEW Chew 2 tablets by mouth daily.    . potassium chloride SA (K-DUR,KLOR-CON) 20 MEQ tablet Take 1 tablet (20 mEq total) by mouth daily. 90 tablet 2  . pravastatin (PRAVACHOL) 40 MG tablet Take 1 tablet (40 mg total) by mouth daily. 90 tablet 2  . spironolactone (ALDACTONE) 25 MG tablet Take 0.5 tablets (12.5 mg total) by mouth daily. 90 tablet 3   No current facility-administered medications for this visit.   BP 122/62 mmHg  Pulse 55  Ht 5\' 8"  (1.727 m)  Wt 163 lb (73.936 kg)  BMI 24.79 kg/m2  General: NAD Neck: No JVD, no thyromegaly or thyroid nodule.  Lungs: Clear to auscultation bilaterally with normal respiratory effort. CV: Nondisplaced PMI.  Heart regular S1/S2, +S4, no murmur.  No edema.  No carotid bruit.  Normal pedal pulses.  Abdomen: Soft, nontender, no hepatosplenomegaly, no distention.  Skin: Intact without lesions or rashes.  Neurologic: Alert and oriented x 3.  Psych: Normal affect. Extremities: No clubbing or cyanosis.  HEENT: Normal.   Assessment/Plan: 1. HTN: Resistant HTN.  She did not tolerate clonidine due to lethargy.  LVH on echo  likely due to poor control of HTN.  Control is now much improved.  - Continue amlodipine, metoprolol, chlorthalidone, spironolactone, and losartan.  - She will check BP daily and we will call her with readings in 2 wks.  2. OSA: Suspected.  This may be potentiating HTN.  Waiting for sleep study.    Followup in 4 months.   [name] 01/03/2015

## 2015-02-13 ENCOUNTER — Other Ambulatory Visit: Payer: Self-pay | Admitting: Cardiology

## 2015-04-23 ENCOUNTER — Encounter: Payer: Medicare Other | Admitting: Internal Medicine

## 2015-05-09 ENCOUNTER — Ambulatory Visit (INDEPENDENT_AMBULATORY_CARE_PROVIDER_SITE_OTHER): Payer: Medicare Other | Admitting: Cardiology

## 2015-05-09 ENCOUNTER — Encounter: Payer: Self-pay | Admitting: Cardiology

## 2015-05-09 VITALS — BP 124/62 | HR 63 | Ht 68.0 in | Wt 161.4 lb

## 2015-05-09 DIAGNOSIS — E782 Mixed hyperlipidemia: Secondary | ICD-10-CM

## 2015-05-09 DIAGNOSIS — I10 Essential (primary) hypertension: Secondary | ICD-10-CM | POA: Diagnosis not present

## 2015-05-09 NOTE — Patient Instructions (Addendum)
Medication Instructions: - no changes  Labwork: - Your physician recommends that you return for a FASTING lipid profile: next week  Procedures/Testing: - none  Follow-Up: - Your physician wants you to follow-up in: 6 months (March 2017) with [name]. You will receive a reminder letter in the mail two months in advance. If you don't receive a letter, please call our office to schedule the follow-up appointment.  Any Additional Special Instructions Will Be Listed Below (If Applicable). - none

## 2015-05-11 NOTE — Progress Notes (Signed)
Patient ID: Katherine Koch, female   DOB: 1946-04-12, 69 y.o.   MRN: [id number] PCP: [name]  69 yo with history of resistant HTN presents for followup of HTN.  She has had HTN for years. HTN runs strongly in her family.  Recent echo showed normal EF and severe LVH, likely due to long-standing HTN.  I have been adjusting her antihypertensive regimen and she has been exercising and losing weight.  BP was actually too low at one point recently with orthostasis, and I cut back on her spironolactone.  BP is now under good control both here and at home.  Wieght is down another 2 lbs since last appointment.   No exertional dyspnea or chest pain.  No palpitations, lightheadedness, orthopnea or PND.  She snores and sometimes gasps in her sleep. She goes to the gym 3 days a week and uses the elliptical for 50 minutes.   Labs (8/15): K 3.4, creatinine 0.8, LDL 148, HDL 33 Labs (1/16): K 3.6, creatinine 0.81 Labs (4/16): K 4.7, creatinine 1.01 Labs (9/16): K 4.7, creatinine 1.01  PMH: 1. OA knees 2. Hysterectomy 3. Hyperlipidemia 4. HTN: Lethargy with clonidine use.  Resistant HTN x years.  Echo (8/15) with EF 60-65%, severe LVH, grade II diastolic dysfunction, mild MR.   FH: Mother HTN, sister HTN  SH: Single, moved to [name] from [place] to live near daughter.  Retired Chief Financial Officer.  Prior smoker, quit 2011.   ROS: All systems reviewed and negative except as per HPI.   Current Outpatient Prescriptions  Medication Sig Dispense Refill  . amLODipine (NORVASC) 10 MG tablet Take 1 tablet (10 mg total) by mouth daily. 90 tablet 2  . aspirin 81 MG tablet Take 81 mg by mouth daily.      . Calcium Carbonate-Vitamin D (CALTRATE 600+D PO) Take by mouth.      . chlorthalidone (HYGROTON) 25 MG tablet TAKE 1 TABLET BY MOUTH DAILY 30 tablet 3  . fluticasone (FLONASE) 50 MCG/ACT nasal spray Place 2 sprays into the nose daily. (Patient taking differently: Place 2 sprays into the nose daily as needed. ) 16 g 6  . losartan  (COZAAR) 100 MG tablet Take 1 tablet (100 mg total) by mouth daily. 90 tablet 2  . metoprolol (LOPRESSOR) 50 MG tablet Take 1.5 tablets (75 mg total) by mouth 2 (two) times daily. 270 tablet 2  . Multiple Vitamins-Minerals (MULTIVITAMINS) CHEW Chew 2 tablets by mouth daily.    . potassium chloride SA (K-DUR,KLOR-CON) 20 MEQ tablet Take 1 tablet (20 mEq total) by mouth daily. 90 tablet 2  . pravastatin (PRAVACHOL) 40 MG tablet Take 1 tablet (40 mg total) by mouth daily. 90 tablet 2  . spironolactone (ALDACTONE) 25 MG tablet Take 0.5 tablets (12.5 mg total) by mouth daily. 90 tablet 3   No current facility-administered medications for this visit.   BP 124/62 mmHg  Pulse 63  Ht 5\' 8"  (1.727 m)  Wt 161 lb 6.4 oz (73.211 kg)  BMI 24.55 kg/m2  SpO2 99%  General: NAD Neck: No JVD, no thyromegaly or thyroid nodule.  Lungs: Clear to auscultation bilaterally with normal respiratory effort. CV: Nondisplaced PMI.  Heart regular S1/S2, +S4, no murmur.  No edema.  No carotid bruit.  Normal pedal pulses.  Abdomen: Soft, nontender, no hepatosplenomegaly, no distention.  Skin: Intact without lesions or rashes.  Neurologic: Alert and oriented x 3.  Psych: Normal affect. Extremities: No clubbing or cyanosis.  HEENT: Normal.   Assessment/Plan: 1. HTN: Resistant  HTN.  She did not tolerate clonidine due to lethargy.  LVH on echo likely due to poor control of HTN.  Control is now much improved.  - Continue amlodipine, metoprolol, chlorthalidone, spironolactone, and losartan.  2. OSA: Suspected.  This may be potentiating HTN.  She is not interested in getting a sleep study.     Followup in 6 months.   [name] 05/11/2015

## 2015-05-14 ENCOUNTER — Other Ambulatory Visit (INDEPENDENT_AMBULATORY_CARE_PROVIDER_SITE_OTHER): Payer: Medicare Other | Admitting: *Deleted

## 2015-05-14 DIAGNOSIS — E782 Mixed hyperlipidemia: Secondary | ICD-10-CM

## 2015-05-14 LAB — LIPID PANEL
CHOLESTEROL: 211 mg/dL — AB (ref 0–200)
HDL: 43.6 mg/dL (ref >39.00)
LDL Cholesterol: 145 mg/dL — ABNORMAL HIGH (ref 0–99)
NonHDL: 167.16
Total CHOL/HDL Ratio: 5
Triglycerides: 110 mg/dL (ref 0.0–149.0)
VLDL: 22 mg/dL (ref 0.0–40.0)

## 2015-05-19 ENCOUNTER — Other Ambulatory Visit: Payer: Self-pay | Admitting: *Deleted

## 2015-05-20 ENCOUNTER — Other Ambulatory Visit: Payer: Self-pay | Admitting: *Deleted

## 2015-05-20 DIAGNOSIS — E785 Hyperlipidemia, unspecified: Secondary | ICD-10-CM

## 2015-05-20 MED ORDER — PRAVASTATIN SODIUM 40 MG PO TABS: 40.0000 mg | ORAL_TABLET | Freq: Every evening | ORAL | Status: DC

## 2015-05-28 ENCOUNTER — Ambulatory Visit (INDEPENDENT_AMBULATORY_CARE_PROVIDER_SITE_OTHER): Payer: Medicare Other | Admitting: Internal Medicine

## 2015-05-28 ENCOUNTER — Encounter: Payer: Self-pay | Admitting: Internal Medicine

## 2015-05-28 ENCOUNTER — Telehealth: Payer: Self-pay | Admitting: Cardiology

## 2015-05-28 VITALS — BP 116/78 | HR 59 | Temp 98.2°F | Ht 68.0 in | Wt 161.1 lb

## 2015-05-28 DIAGNOSIS — E785 Hyperlipidemia, unspecified: Secondary | ICD-10-CM | POA: Diagnosis not present

## 2015-05-28 DIAGNOSIS — Z1159 Encounter for screening for other viral diseases: Secondary | ICD-10-CM

## 2015-05-28 DIAGNOSIS — Z09 Encounter for follow-up examination after completed treatment for conditions other than malignant neoplasm: Secondary | ICD-10-CM

## 2015-05-28 DIAGNOSIS — Z23 Encounter for immunization: Secondary | ICD-10-CM

## 2015-05-28 DIAGNOSIS — I10 Essential (primary) hypertension: Secondary | ICD-10-CM | POA: Diagnosis not present

## 2015-05-28 LAB — BASIC METABOLIC PANEL
BUN: 13 mg/dL (ref 6–23)
CHLORIDE: 101 meq/L (ref 96–112)
CO2: 31 meq/L (ref 19–32)
CREATININE: 0.86 mg/dL (ref 0.40–1.20)
Calcium: 10.6 mg/dL — ABNORMAL HIGH (ref 8.4–10.5)
GFR: 69.48 mL/min (ref >60.00)
Glucose, Bld: 106 mg/dL — ABNORMAL HIGH (ref 70–99)
POTASSIUM: 4.7 meq/L (ref 3.5–5.1)
Sodium: 138 mEq/L (ref 135–145)

## 2015-05-28 LAB — TSH: TSH: 1.11 u[IU]/mL (ref 0.35–4.50)

## 2015-05-28 NOTE — Progress Notes (Signed)
Subjective:    Patient ID: Katherine Koch, female    DOB: 04-04-1946, 69 y.o.   MRN: [id number]  DOS:  05/28/2015 Type of visit - description : routine visit Interval history: hypertension: Good compliance of medication, occasionally her BP goes in the 90s, she feels a slightly dizzy. Usually happened in the context of going to the gym and exercise high cholesterol: Recently Pravachol dose was adjusted by cardiology  Review of Systems Denies chest pain or difficulty breathing. No lower extremity edema No nausea, vomiting, diarrhea During the summer, her diet has not been the best, eating out frequently.   Past Medical History  Diagnosis Date  . Hyperlipemia   . Hypertension   . Osteopenia     per pt had a DEXA few years ago  . Allergic rhinitis   . Shingles 3/10  . Dry eye syndrome     Past Surgical History  Procedure Laterality Date  . Breast lumpectomy      fibrocystic  . Abdominal hysterectomy      oophorectomy- remotely    Social History   Social History  . Marital Status: Single    Spouse Name: N/A  . Number of Children: 1  . Years of Education: N/A   Occupational History  . retired      Lawyer   Social History Main Topics  . Smoking status: Former Research scientist (life sciences)  . Smokeless tobacco: Never Used     Comment: quit 2011  . Alcohol Use: No  . Drug Use: No  . Sexual Activity: Not on file   Other Topics Concern  . 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  Always use your most recent med list.               amLODipine 10 MG tablet  Commonly known as:  NORVASC  Take 1 tablet (10 mg total) by mouth daily.     aspirin 81 MG tablet  Take 81 mg by mouth daily.     CALTRATE 600+D PO  Take by mouth.     chlorthalidone 25 MG tablet  Commonly known as:  HYGROTON  TAKE 1 TABLET BY  MOUTH DAILY     fluticasone 50 MCG/ACT nasal spray  Commonly known as:  FLONASE  Place 2 sprays into the nose daily.     losartan 100 MG tablet  Commonly known as:  COZAAR  Take 1 tablet (100 mg total) by mouth daily.     metoprolol 50 MG tablet  Commonly known as:  LOPRESSOR  Take 1.5 tablets (75 mg total) by mouth 2 (two) times daily.     MULTIVITAMINS Chew  Chew 2 tablets by mouth daily.     potassium chloride SA 20 MEQ tablet  Commonly known as:  K-DUR,KLOR-CON  Take 1 tablet (20 mEq total) by mouth daily.     pravastatin 40 MG tablet  Commonly known as:  PRAVACHOL  Take 1 tablet (40 mg total) by mouth every evening.     spironolactone 25 MG tablet  Commonly known as:  ALDACTONE  Take 0.5 tablets (12.5 mg total) by mouth daily.           Objective:  Physical Exam BP 116/78 mmHg  Pulse 59  Temp(Src) 98.2 F (36.8 C) (Oral)  Ht 5\' 8"  (1.727 m)  Wt 161 lb 2 oz (73.086 kg)  BMI 24.50 kg/m2  SpO2 98% General:   Well developed, well nourished . NAD.  HEENT:  Normocephalic . Face symmetric, atraumatic Lungs:  CTA B Normal respiratory effort, no intercostal retractions, no accessory muscle use. Heart: RRR,  no murmur.  No pretibial edema bilaterally  Skin: Not pale. Not jaundice Neurologic:  alert & oriented X3.  Speech normal, gait appropriate for age and unassisted Psych--  Cognition and judgment appear intact.  Cooperative with normal attention span and concentration.  Behavior appropriate. No anxious or depressed appearing.      Assessment & Plan:   Assessment> HTN Hyperlipidemia Osteopenia Allergic rhinitis Shingles 2010 Dry eye syndrome  Plan  HTN: Under excellent control with current medications, check a BMP. If BP is a slightly in the low side she knows to drink plenty of fluids and okay to skip 1 dose of metoprolol. See instructions. Hyperlipidemia: Based on the last [place] cardiology increase Pravachol to 40 mg, plans to go back to  cardiology for blood work Primary care: Flu shot today, RTC 6 months for a physical

## 2015-05-28 NOTE — Telephone Encounter (Signed)
Pt calling requesting lab work from 05-14-15 be mailed to her

## 2015-05-28 NOTE — Patient Instructions (Signed)
Get your blood work before you leave    Check the  blood pressure 2 or 3 times a    Week Be sure your blood pressure is between 110/65 and  145/85.  if it is consistently higher or lower, let me know   If you have BP is in the low side, okay to skip a dose of metoprolol one time and recheck your blood pressure. If your blood pressure is persistently low please let us know   Next visit  for a physical exam in 6 months, no fasting. Please schedule an appointment at the front desk

## 2015-05-28 NOTE — Assessment & Plan Note (Signed)
HTN: Under excellent control with current medications, check a BMP. If BP is a slightly in the low side she knows to drink plenty of fluids and okay to skip 1 dose of metoprolol. See instructions. Hyperlipidemia: Based on the last [place] cardiology increase Pravachol to 40 mg, plans to go back to cardiology for blood work Primary care: Flu shot today, RTC 6 months for a physical

## 2015-05-28 NOTE — Progress Notes (Signed)
Pre visit review using our clinic review tool, if applicable. No additional management support is needed unless otherwise documented below in the visit note. 

## 2015-06-23 ENCOUNTER — Other Ambulatory Visit: Payer: Self-pay | Admitting: Cardiology

## 2015-07-16 ENCOUNTER — Other Ambulatory Visit (INDEPENDENT_AMBULATORY_CARE_PROVIDER_SITE_OTHER): Payer: Medicare Other | Admitting: *Deleted

## 2015-07-16 DIAGNOSIS — E785 Hyperlipidemia, unspecified: Secondary | ICD-10-CM | POA: Diagnosis not present

## 2015-07-16 DIAGNOSIS — I10 Essential (primary) hypertension: Secondary | ICD-10-CM

## 2015-07-16 LAB — HEPATIC FUNCTION PANEL
ALK PHOS: 55 U/L (ref 33–130)
ALT: 19 U/L (ref 6–29)
AST: 20 U/L (ref 10–35)
Albumin: 4.3 g/dL (ref 3.6–5.1)
BILIRUBIN DIRECT: 0.2 mg/dL (ref <=0.2)
BILIRUBIN TOTAL: 0.8 mg/dL (ref 0.2–1.2)
Indirect Bilirubin: 0.6 mg/dL (ref 0.2–1.2)
Total Protein: 7.3 g/dL (ref 6.1–8.1)

## 2015-07-16 LAB — LIPID PANEL
Cholesterol: 199 mg/dL (ref 125–200)
HDL: 39 mg/dL — ABNORMAL LOW (ref >=46)
LDL Cholesterol: 136 mg/dL — ABNORMAL HIGH (ref <130)
Total CHOL/HDL Ratio: 5.1 Ratio — ABNORMAL HIGH (ref <=5.0)
Triglycerides: 121 mg/dL (ref <150)
VLDL: 24 mg/dL (ref <30)

## 2015-07-16 NOTE — Addendum Note (Signed)
Addended by: Eulis Foster on: 07/16/2015 07:39 AM   Modules accepted: Orders

## 2015-07-22 ENCOUNTER — Other Ambulatory Visit: Payer: Self-pay | Admitting: Cardiology

## 2015-07-23 ENCOUNTER — Telehealth: Payer: Self-pay | Admitting: *Deleted

## 2015-07-23 DIAGNOSIS — I10 Essential (primary) hypertension: Secondary | ICD-10-CM

## 2015-07-23 DIAGNOSIS — E785 Hyperlipidemia, unspecified: Secondary | ICD-10-CM

## 2015-07-23 MED ORDER — PRAVASTATIN SODIUM 80 MG PO TABS: 80.0000 mg | ORAL_TABLET | Freq: Every evening | ORAL | Status: DC

## 2015-07-23 NOTE — Telephone Encounter (Signed)
  Notes Recorded by Larey Dresser, MD on 07/18/2015 at 3:39 PM Lipids a bit better but LDL still high, keep working on diet and would increase pravastatin to 80 mg daily with lipids/LFTs in 2 months.

## 2015-09-18 ENCOUNTER — Other Ambulatory Visit: Payer: Self-pay | Admitting: Internal Medicine

## 2015-09-18 ENCOUNTER — Other Ambulatory Visit: Payer: Self-pay

## 2015-09-18 MED ORDER — LOSARTAN POTASSIUM 100 MG PO TABS
100.0000 mg | ORAL_TABLET | Freq: Every day | ORAL | Status: DC
Start: 2015-09-18 — End: 2016-03-22

## 2015-09-18 MED ORDER — AMLODIPINE BESYLATE 10 MG PO TABS: 10.0000 mg | ORAL_TABLET | Freq: Every day | ORAL | Status: DC

## 2015-10-12 ENCOUNTER — Other Ambulatory Visit: Payer: Self-pay | Admitting: Cardiology

## 2015-10-29 ENCOUNTER — Other Ambulatory Visit (INDEPENDENT_AMBULATORY_CARE_PROVIDER_SITE_OTHER): Payer: Medicare Other | Admitting: *Deleted

## 2015-10-29 DIAGNOSIS — E785 Hyperlipidemia, unspecified: Secondary | ICD-10-CM | POA: Diagnosis not present

## 2015-10-29 DIAGNOSIS — I10 Essential (primary) hypertension: Secondary | ICD-10-CM | POA: Diagnosis not present

## 2015-10-29 LAB — HEPATIC FUNCTION PANEL
ALT: 20 U/L (ref 6–29)
AST: 23 U/L (ref 10–35)
Albumin: 4.2 g/dL (ref 3.6–5.1)
Alkaline Phosphatase: 51 U/L (ref 33–130)
BILIRUBIN DIRECT: 0.1 mg/dL (ref <=0.2)
BILIRUBIN TOTAL: 0.8 mg/dL (ref 0.2–1.2)
Indirect Bilirubin: 0.7 mg/dL (ref 0.2–1.2)
Total Protein: 7.3 g/dL (ref 6.1–8.1)

## 2015-10-29 LAB — LIPID PANEL
CHOL/HDL RATIO: 4.7 ratio (ref <=5.0)
Cholesterol: 204 mg/dL — ABNORMAL HIGH (ref 125–200)
HDL: 43 mg/dL — ABNORMAL LOW (ref >=46)
LDL CALC: 142 mg/dL — AB (ref <130)
Triglycerides: 93 mg/dL (ref <150)
VLDL: 19 mg/dL (ref <30)

## 2015-10-29 NOTE — Addendum Note (Signed)
Addended by: Eulis Foster on: 10/29/2015 09:25 AM   Modules accepted: Orders

## 2015-10-30 ENCOUNTER — Other Ambulatory Visit: Payer: Self-pay | Admitting: *Deleted

## 2015-10-30 DIAGNOSIS — E785 Hyperlipidemia, unspecified: Secondary | ICD-10-CM

## 2015-10-30 DIAGNOSIS — Z79899 Other long term (current) drug therapy: Secondary | ICD-10-CM

## 2015-11-07 ENCOUNTER — Other Ambulatory Visit: Payer: Self-pay

## 2015-11-07 DIAGNOSIS — Z1231 Encounter for screening mammogram for malignant neoplasm of breast: Secondary | ICD-10-CM

## 2015-11-11 ENCOUNTER — Telehealth: Payer: Self-pay | Admitting: Internal Medicine

## 2015-11-11 MED ORDER — FLUTICASONE PROPIONATE 50 MCG/ACT NA SUSP: 2.0000 | Freq: Every day | NASAL | Status: DC

## 2015-11-11 NOTE — Telephone Encounter (Signed)
Self   Medication refill request on [facility].   Pharmacy: [id number] - [place], [address] AT [place] [address]

## 2015-11-11 NOTE — Telephone Encounter (Signed)
Last OV 05/2015, Pt has CPE scheduled 12/31/2015. Rx sent.

## 2015-11-28 ENCOUNTER — Other Ambulatory Visit: Payer: Self-pay | Admitting: Cardiology

## 2015-12-07 ENCOUNTER — Emergency Department (HOSPITAL_BASED_OUTPATIENT_CLINIC_OR_DEPARTMENT_OTHER): Payer: Medicare Other

## 2015-12-07 ENCOUNTER — Emergency Department (HOSPITAL_BASED_OUTPATIENT_CLINIC_OR_DEPARTMENT_OTHER)
Admission: EM | Admit: 2015-12-07 | Discharge: 2015-12-07 | Disposition: A | Discharge status: Home / Self Care | Payer: Medicare Other | Attending: Emergency Medicine | Admitting: Emergency Medicine

## 2015-12-07 ENCOUNTER — Encounter (HOSPITAL_BASED_OUTPATIENT_CLINIC_OR_DEPARTMENT_OTHER): Payer: Self-pay | Admitting: *Deleted

## 2015-12-07 ENCOUNTER — Ambulatory Visit (HOSPITAL_BASED_OUTPATIENT_CLINIC_OR_DEPARTMENT_OTHER): Payer: Medicare Other

## 2015-12-07 DIAGNOSIS — R3989 Other symptoms and signs involving the genitourinary system: Secondary | ICD-10-CM | POA: Insufficient documentation

## 2015-12-07 DIAGNOSIS — Z90710 Acquired absence of both cervix and uterus: Secondary | ICD-10-CM | POA: Diagnosis not present

## 2015-12-07 DIAGNOSIS — R300 Dysuria: Secondary | ICD-10-CM | POA: Insufficient documentation

## 2015-12-07 DIAGNOSIS — Z87891 Personal history of nicotine dependence: Secondary | ICD-10-CM | POA: Insufficient documentation

## 2015-12-07 DIAGNOSIS — M8580 Other specified disorders of bone density and structure, unspecified site: Secondary | ICD-10-CM | POA: Insufficient documentation

## 2015-12-07 DIAGNOSIS — Z7982 Long term (current) use of aspirin: Secondary | ICD-10-CM | POA: Diagnosis not present

## 2015-12-07 DIAGNOSIS — Z8669 Personal history of other diseases of the nervous system and sense organs: Secondary | ICD-10-CM | POA: Diagnosis not present

## 2015-12-07 DIAGNOSIS — Z8619 Personal history of other infectious and parasitic diseases: Secondary | ICD-10-CM | POA: Insufficient documentation

## 2015-12-07 DIAGNOSIS — Z8639 Personal history of other endocrine, nutritional and metabolic disease: Secondary | ICD-10-CM | POA: Diagnosis not present

## 2015-12-07 DIAGNOSIS — I10 Essential (primary) hypertension: Secondary | ICD-10-CM | POA: Diagnosis not present

## 2015-12-07 DIAGNOSIS — R1031 Right lower quadrant pain: Secondary | ICD-10-CM | POA: Diagnosis present

## 2015-12-07 DIAGNOSIS — Z79899 Other long term (current) drug therapy: Secondary | ICD-10-CM | POA: Diagnosis not present

## 2015-12-07 LAB — URINALYSIS, ROUTINE W REFLEX MICROSCOPIC
Bilirubin Urine: NEGATIVE
Glucose, UA: NEGATIVE mg/dL
Hgb urine dipstick: NEGATIVE
Ketones, ur: NEGATIVE mg/dL
LEUKOCYTES UA: NEGATIVE
Nitrite: NEGATIVE
PROTEIN: NEGATIVE mg/dL
SPECIFIC GRAVITY, URINE: 1.004 — AB (ref 1.005–1.030)
pH: 7.5 (ref 5.0–8.0)

## 2015-12-07 MED ORDER — PHENAZOPYRIDINE HCL 200 MG PO TABS: 200.0000 mg | ORAL_TABLET | Freq: Three times a day (TID) | ORAL | Status: DC

## 2015-12-07 NOTE — ED Notes (Signed)
MD at bedside. 

## 2015-12-07 NOTE — ED Notes (Signed)
EDP informed this RN that does not want labs, IV and/ or CT scan

## 2015-12-07 NOTE — ED Notes (Signed)
Pt refuses lab work, IV and CT scan, EDP informed. Explained rationale for lab work, IV and CT scan per EDP orders.

## 2015-12-07 NOTE — ED Notes (Signed)
Right lower abdominal pain intermittently x 2 weeks.

## 2015-12-07 NOTE — Discharge Instructions (Signed)

## 2015-12-07 NOTE — ED Provider Notes (Signed)
CSN: [id number]     Arrival date & time 12/07/15  1605 History  By signing my name below, I, Helane Gunther, attest that this documentation has been prepared under the direction and in the presence of Leonard Schwartz, MD. Electronically Signed: Helane Gunther, ED Scribe. 12/07/2015. 4:29 PM.     Chief Complaint  Patient presents with  . Abdominal Pain   The history is provided by the patient and a relative. No language interpreter was used.   HPI Comments: Katherine Koch is a 70 y.o. female former smoker with a PMHx of HTN and HLD as well as a PSHx of abdominal hysterectomy and appendectomy who presents to the Emergency Department complaining of intermittent, stabbing, RLQ abdominal discomfort onset 2 weeks ago. Pt states that she had a sharp pain radiating upwards once with urination. She notes she has not been drinking much for the past few days, as she does not want to use the restroom due to the resultant pain. She notes a PMHx of colonoscopy 8 years ago, which was normal. She notes her PCP is Dr Larose Kells. Pt denies flank pain, fever, constipation, and vomiting.  Past Medical History  Diagnosis Date  . Hyperlipemia   . Hypertension   . Osteopenia     per pt had a DEXA few years ago  . Allergic rhinitis   . Shingles 3/10  . Dry eye syndrome    Past Surgical History  Procedure Laterality Date  . Breast lumpectomy      fibrocystic  . Abdominal hysterectomy      oophorectomy- remotely   Family History  Problem Relation Age of Onset  . Coronary artery disease Mother     onset?  . Diabetes Neg Hx   . Stroke Neg Hx   . Hypertension Mother   . Hypertension Brother   . Colon cancer Neg Hx   . Breast cancer Neg Hx   . Lymphoma Mother     deceased non-hodgkins    Social History  Substance Use Topics  . Smoking status: Former Research scientist (life sciences)  . Smokeless tobacco: Never Used     Comment: quit 2011  . Alcohol Use: No   OB History    No data available     Review of Systems  Constitutional:  Negative for fever.  Gastrointestinal: Positive for abdominal pain. Negative for vomiting and constipation.  Genitourinary: Positive for dysuria. Negative for flank pain.  All other systems reviewed and are negative.   Allergies  Review of patient's allergies indicates no known allergies.  Home Medications   Prior to Admission medications   Medication Sig Start Date End Date Taking? Authorizing Provider  amLODipine (NORVASC) 10 MG tablet Take 1 tablet (10 mg total) by mouth daily. 09/18/15   Colon Branch, MD  aspirin 81 MG tablet Take 81 mg by mouth daily.      Historical Provider, MD  Calcium Carbonate-Vitamin D (CALTRATE 600+D PO) Take by mouth.      Historical Provider, MD  chlorthalidone (HYGROTON) 25 MG tablet TAKE 1 TABLET BY MOUTH DAILY 10/14/15   Larey Dresser, MD  fluticasone [facility]) 50 MCG/ACT nasal spray Place 2 sprays into both nostrils daily. 11/11/15   Colon Branch, MD  losartan (COZAAR) 100 MG tablet Take 1 tablet (100 mg total) by mouth daily. 09/18/15   Colon Branch, MD  metoprolol (LOPRESSOR) 50 MG tablet Take 1.5 tablets (75 mg total) by mouth 2 (two) times daily. 09/18/15   Colon Branch, MD  Multiple Vitamins-Minerals (MULTIVITAMINS) CHEW Chew 2 tablets by mouth daily.    Historical Provider, MD  phenazopyridine (PYRIDIUM) 200 MG tablet Take 1 tablet (200 mg total) by mouth 3 (three) times daily. 12/07/15   Leonard Schwartz, MD  potassium chloride SA (K-DUR,KLOR-CON) 20 MEQ tablet Take 1 tablet (20 mEq total) by mouth daily. 12/06/14   Colon Branch, MD  pravastatin (PRAVACHOL) 80 MG tablet TAKE 1 TABLET(80 MG) BY MOUTH EVERY EVENING 11/28/15   Larey Dresser, MD  spironolactone (ALDACTONE) 25 MG tablet TAKE 1 TABLET BY MOUTH DAILY 07/23/15   Larey Dresser, MD   BP 152/74 mmHg  Pulse 61  Temp(Src) 97.3 F (36.3 C) (Oral)  Resp 18  Ht 5\' 6"  (1.676 m)  Wt 160 lb (72.576 kg)  BMI 25.84 kg/m2  SpO2 100% Physical Exam  Constitutional: She is oriented to person, place, and time. She  appears well-developed and well-nourished. No distress.  HENT:  Head: Normocephalic and atraumatic.  Eyes: Pupils are equal, round, and reactive to light.  Neck: Normal range of motion.  Cardiovascular: Normal rate and intact distal pulses.   Pulmonary/Chest: No respiratory distress.  Abdominal: Normal appearance. She exhibits no distension. There is tenderness in the suprapubic area.    Musculoskeletal: Normal range of motion.  Neurological: She is alert and oriented to person, place, and time. No cranial nerve deficit.  Skin: Skin is warm and dry. No rash noted.  Psychiatric: She has a normal mood and affect. Her behavior is normal.  Nursing note and vitals reviewed.   ED Course  Procedures  DIAGNOSTIC STUDIES: Oxygen Saturation is 100% on RA, normal by my interpretation.    COORDINATION OF CARE: 4:26 PM - Discussed plans to wait on urinalysis for possible UTI or kidney stone. Pt advised of plan for treatment and pt agrees.  Labs Review Labs Reviewed  URINALYSIS, ROUTINE W REFLEX MICROSCOPIC (NOT AT [facility]) - Abnormal; Notable for the following:    Specific Gravity, Urine 1.004 (*)    All other components within normal limits  COMPREHENSIVE METABOLIC PANEL  CBC WITH DIFFERENTIAL/PLATELET  LIPASE, BLOOD    Imaging Review No results found. I have personally reviewed and evaluated these images and lab results as part of my medical decision-making.  I strongly recommended the patient undergo blood work and CT scan of the abdomen pelvis.  At this time the patient did not want to go through those tests.  I offered her some Pyridium as an alternative which she agreed to do.  I told her to come back if she develops worsening pain, fever, chills, nausea, vomiting.  If she wants to follow-up with me I told her I would be at [facility] at 4 PM this coming Wednesday and will be glad to see her again.  MDM   Final diagnoses:  Bladder pain      Leonard Schwartz, MD 12/07/15  1736

## 2015-12-07 NOTE — ED Notes (Signed)
DC instructions and Rx reviewed with pt, opportunity for questions provided

## 2015-12-07 NOTE — ED Notes (Signed)
Patient denies pain and is resting comfortably.  

## 2015-12-07 NOTE — ED Notes (Signed)
MD at bedside, to speak with pt re: urine results and discuss plan of care

## 2015-12-11 ENCOUNTER — Telehealth: Payer: Self-pay | Admitting: Gastroenterology

## 2015-12-11 NOTE — Telephone Encounter (Signed)
Pt states she has been having abdominal pain right around her navel for over a week. Pt requesting to be seen. Pt scheduled to see Amy Esterwood PA 12/24/15@3pm . Pt aware of appt.

## 2015-12-17 ENCOUNTER — Ambulatory Visit (INDEPENDENT_AMBULATORY_CARE_PROVIDER_SITE_OTHER): Payer: Medicare Other | Admitting: Physician Assistant

## 2015-12-17 ENCOUNTER — Other Ambulatory Visit (INDEPENDENT_AMBULATORY_CARE_PROVIDER_SITE_OTHER): Payer: Medicare Other

## 2015-12-17 ENCOUNTER — Encounter: Payer: Self-pay | Admitting: Physician Assistant

## 2015-12-17 VITALS — BP 140/66 | HR 64 | Ht 66.25 in | Wt 156.1 lb

## 2015-12-17 DIAGNOSIS — G8929 Other chronic pain: Secondary | ICD-10-CM

## 2015-12-17 DIAGNOSIS — R1084 Generalized abdominal pain: Secondary | ICD-10-CM

## 2015-12-17 DIAGNOSIS — R1031 Right lower quadrant pain: Secondary | ICD-10-CM

## 2015-12-17 LAB — COMPREHENSIVE METABOLIC PANEL
ALBUMIN: 4.8 g/dL (ref 3.5–5.2)
ALK PHOS: 53 U/L (ref 39–117)
ALT: 23 U/L (ref 0–35)
AST: 22 U/L (ref 0–37)
BILIRUBIN TOTAL: 0.7 mg/dL (ref 0.2–1.2)
BUN: 17 mg/dL (ref 6–23)
CALCIUM: 10 mg/dL (ref 8.4–10.5)
CO2: 30 mEq/L (ref 19–32)
Chloride: 101 mEq/L (ref 96–112)
Creatinine, Ser: 0.76 mg/dL (ref 0.40–1.20)
GFR: 80.01 mL/min (ref >60.00)
GLUCOSE: 93 mg/dL (ref 70–99)
Potassium: 3.7 mEq/L (ref 3.5–5.1)
SODIUM: 138 meq/L (ref 135–145)
Total Protein: 7.9 g/dL (ref 6.0–8.3)

## 2015-12-17 LAB — CBC WITH DIFFERENTIAL/PLATELET
BASOS ABS: 0 10*3/uL (ref 0.0–0.1)
BASOS PCT: 0.7 % (ref 0.0–3.0)
EOS ABS: 0.1 10*3/uL (ref 0.0–0.7)
Eosinophils Relative: 3.1 % (ref 0.0–5.0)
HEMATOCRIT: 38.5 % (ref 36.0–46.0)
HEMOGLOBIN: 12.9 g/dL (ref 12.0–15.0)
LYMPHS PCT: 32.5 % (ref 12.0–46.0)
Lymphs Abs: 1.4 10*3/uL (ref 0.7–4.0)
MCHC: 33.7 g/dL (ref 30.0–36.0)
MCV: 85.8 fl (ref 78.0–100.0)
MONO ABS: 0.3 10*3/uL (ref 0.1–1.0)
Monocytes Relative: 7.8 % (ref 3.0–12.0)
Neutro Abs: 2.4 10*3/uL (ref 1.4–7.7)
Neutrophils Relative %: 55.9 % (ref 43.0–77.0)
PLATELETS: 263 10*3/uL (ref 150.0–400.0)
RBC: 4.48 Mil/uL (ref 3.87–5.11)
RDW: 13 % (ref 11.5–15.5)
WBC: 4.2 10*3/uL (ref 4.0–10.5)

## 2015-12-17 NOTE — Patient Instructions (Signed)
Please go to the basement level to have your labs drawn.   We will call you with the results.   Please call us back regarding what you have decided about having a CT scan.

## 2015-12-17 NOTE — Progress Notes (Signed)
Patient ID: Katherine Koch, female   DOB: 01-14-1946, 70 y.o.   MRN: [id number]   Subjective:    Patient ID: Katherine Koch, female    DOB: 1945/08/21, 70 y.o.   MRN: [id number]  HPI  Katherine Koch is a pleasant 70 year old female known to Dr. Ardis Hughs from prior colonoscopy done in 2010 for screening. This was a normal exam. She has history of obstructive sleep apnea, anxiety hypertension and is status post hysterectomy. She comes in today with complaints of right lower abdominal discomfort. She says her symptoms started for 5 days before she had an ER visit on 12/07/2015 with right-sided abdominal discomfort and some vague urinary symptoms. She is said she was urinating more frequently and it had a sharp pain in her vaginal/urethral area. She had UA done which was negative but did not want to have any imaging done at that time though CT of the abdomen was suggested. She comes in today for further evaluation of the same abdominal discomfort. She says it seems to be intermittent and she has noticed a "thumping" sensation when she presses on her abdomen. She has  not had any nausea or vomiting, appetite has been okay, her stools are perhaps been a bit softer, no melena or hematochezia. She says she remains somewhat sore in her right mid and right lower abdomen. No current urinary symptoms. She specifically states she does not want to have a lot of invasive tests done.  Review of Systems Pertinent positive and negative review of systems were noted in the above HPI section.  All other review of systems was otherwise negative.  Outpatient Encounter Prescriptions as of 12/17/2015  Medication Sig  . amLODipine (NORVASC) 10 MG tablet Take 1 tablet (10 mg total) by mouth daily.  Marland Kitchen aspirin 81 MG tablet Take 81 mg by mouth daily.    . Calcium Carbonate-Vitamin D (CALTRATE 600+D PO) Take by mouth.    . chlorthalidone (HYGROTON) 25 MG tablet TAKE 1 TABLET BY MOUTH DAILY  . fluticasone (FLONASE) 50 MCG/ACT nasal spray Place 2  sprays into both nostrils daily.  Marland Kitchen losartan (COZAAR) 100 MG tablet Take 1 tablet (100 mg total) by mouth daily.  . metoprolol (LOPRESSOR) 50 MG tablet Take 1.5 tablets (75 mg total) by mouth 2 (two) times daily.  . Multiple Vitamins-Minerals (MULTIVITAMINS) CHEW Chew 2 tablets by mouth daily.  . potassium chloride SA (K-DUR,KLOR-CON) 20 MEQ tablet Take 1 tablet (20 mEq total) by mouth daily.  . pravastatin (PRAVACHOL) 80 MG tablet TAKE 1 TABLET(80 MG) BY MOUTH EVERY EVENING  . spironolactone (ALDACTONE) 25 MG tablet TAKE 1 TABLET BY MOUTH DAILY  . [DISCONTINUED] phenazopyridine (PYRIDIUM) 200 MG tablet Take 1 tablet (200 mg total) by mouth 3 (three) times daily.   No facility-administered encounter medications on file as of 12/17/2015.   No Known Allergies Patient Active Problem List   Diagnosis Date Noted  . PCP NOTES >>> 05/28/2015  . OSA (obstructive sleep apnea) 08/23/2014  . Dry eye syndrome   . Annual physical exam 02/12/2011  . ANXIETY STATE, UNSPECIFIED 06/10/2009  . ALLERGIC RHINITIS 06/10/2009  . Hyperlipidemia 06/18/2008  . Essential hypertension, benign 06/18/2008  . OSTEOPENIA 06/18/2008   Social History   Social History  . Marital Status: Single    Spouse Name: N/A  . Number of Children: 1  . Years of Education: N/A   Occupational History  . retired      Lawyer   Social History Main Topics  . Smoking status: Former  Smoker    Types: Cigarettes  . Smokeless tobacco: Never Used     Comment: quit 2011  . Alcohol Use: No  . Drug Use: No  . Sexual Activity: Not on file   Other Topics Concern  . 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 Puertas's family history includes Coronary artery disease in her mother; Hypertension in her brother and mother; Non-Hodgkin's lymphoma in her mother. There is no history of Diabetes, Stroke, Colon cancer, or Breast  cancer.      Objective:    Filed Vitals:   12/17/15 1449  BP: 140/66  Pulse: 64    Physical Exam   well-developed older African-American female in no acute distress, pleasant blood pressure 140/60 pulse 64 height 5 foot 6 weight 156. HEENT ;nontraumatic normocephalic EOMI PERRLA sclera anicteric, Cardiovascular; regular rate and rhythm with S1-S2 no murmur rub or gallop, Pulmonary ;clear bilaterally, Abdomen ;soft, bowel sounds are present she does have a prominent aortic pulsation, there is mild tenderness in the right mid and right lower quadrant no definite mass or palpable hepatosplenomegaly, Rectal; exam not done, Extremities; no clubbing cyanosis or edema skin warm and dry, Neuropsych ;and affect appropriate     Assessment & Plan:   #42  70 year old African-American female with 2 week history of right mid and right lower quadrant discomfort of unclear etiology. Initially may have had some associated urinary symptoms which quickly resolved but pain persists. #2 anxiety #3 hypertension #4 obstructive sleep apnea #5 Colon Neoplasia surveillance negative colonoscopy 2010  Plan; CBC with differential, CMET CT of the abdomen and pelvis with contrast was advised. Patient is currently reluctant to proceed because she worries about radiation though she has not had any abdominal imaging in many years. She would like to think about the CT scan and call back. If she decides against CT could proceed with complete upper abdominal ultrasound to include abdominal aorta and also get a pelvic ultrasound though CT would be more comprehensive.    Randall Colden S Maeli Spacek PA-C 12/17/2015   Cc: Colon Branch, MD

## 2015-12-18 ENCOUNTER — Ambulatory Visit: Payer: Medicare Other

## 2015-12-18 NOTE — Progress Notes (Signed)
i agree with the above note, plan 

## 2015-12-19 ENCOUNTER — Ambulatory Visit
Admission: RE | Admit: 2015-12-19 | Discharge: 2015-12-19 | Disposition: A | Discharge status: Home / Self Care | Payer: Medicare Other | Source: Ambulatory Visit

## 2015-12-19 ENCOUNTER — Telehealth: Payer: Self-pay | Admitting: Physician Assistant

## 2015-12-19 DIAGNOSIS — Z1231 Encounter for screening mammogram for malignant neoplasm of breast: Secondary | ICD-10-CM

## 2015-12-24 ENCOUNTER — Other Ambulatory Visit: Payer: Self-pay

## 2015-12-24 ENCOUNTER — Telehealth: Payer: Self-pay | Admitting: Physician Assistant

## 2015-12-24 DIAGNOSIS — R1031 Right lower quadrant pain: Secondary | ICD-10-CM

## 2015-12-24 DIAGNOSIS — R198 Other specified symptoms and signs involving the digestive system and abdomen: Secondary | ICD-10-CM

## 2015-12-24 NOTE — Telephone Encounter (Signed)
You can use RLQ pain for pelvic US

## 2015-12-24 NOTE — Telephone Encounter (Signed)
I can get US of the abdomen. No dx for pelvis. Okay to proceed?

## 2015-12-24 NOTE — Telephone Encounter (Signed)
Patient reports continued improvement in her symptoms of abdominal pain/discomfort. It is intermittent pain. It is not daily or constant. Her bowels are moving regularly and without problems. She is asking if there is a blood test or a stool test that might diagnose her problem. She still declines CT of the abdomen.

## 2015-12-24 NOTE — Telephone Encounter (Signed)
I would do complete upper abdominal US to include aorta and a pelvic US - no radiation, please get her scheduled

## 2015-12-29 ENCOUNTER — Ambulatory Visit (HOSPITAL_COMMUNITY): Payer: Medicare Other

## 2015-12-30 ENCOUNTER — Ambulatory Visit (HOSPITAL_COMMUNITY)
Admission: RE | Admit: 2015-12-30 | Discharge: 2015-12-30 | Disposition: A | Discharge status: Home / Self Care | Payer: Medicare Other | Source: Ambulatory Visit | Attending: Physician Assistant | Admitting: Physician Assistant

## 2015-12-30 ENCOUNTER — Telehealth: Payer: Self-pay | Admitting: Behavioral Health

## 2015-12-30 DIAGNOSIS — R1031 Right lower quadrant pain: Secondary | ICD-10-CM | POA: Diagnosis not present

## 2015-12-30 DIAGNOSIS — R194 Change in bowel habit: Secondary | ICD-10-CM | POA: Insufficient documentation

## 2015-12-30 DIAGNOSIS — R198 Other specified symptoms and signs involving the digestive system and abdomen: Secondary | ICD-10-CM

## 2015-12-30 DIAGNOSIS — Z90710 Acquired absence of both cervix and uterus: Secondary | ICD-10-CM | POA: Insufficient documentation

## 2015-12-30 DIAGNOSIS — R938 Abnormal findings on diagnostic imaging of other specified body structures: Secondary | ICD-10-CM | POA: Diagnosis not present

## 2015-12-30 NOTE — Telephone Encounter (Signed)
Unable to reach patient at time of Pre-Visit Call.  Left message for patient to return call when available.    

## 2015-12-31 ENCOUNTER — Encounter: Payer: Self-pay | Admitting: Internal Medicine

## 2015-12-31 ENCOUNTER — Ambulatory Visit (INDEPENDENT_AMBULATORY_CARE_PROVIDER_SITE_OTHER): Payer: Medicare Other | Admitting: Internal Medicine

## 2015-12-31 VITALS — BP 124/70 | HR 60 | Temp 98.1°F | Ht 66.0 in | Wt 153.5 lb

## 2015-12-31 DIAGNOSIS — Z0000 Encounter for general adult medical examination without abnormal findings: Secondary | ICD-10-CM

## 2015-12-31 DIAGNOSIS — M8580 Other specified disorders of bone density and structure, unspecified site: Secondary | ICD-10-CM | POA: Diagnosis not present

## 2015-12-31 DIAGNOSIS — Z09 Encounter for follow-up examination after completed treatment for conditions other than malignant neoplasm: Secondary | ICD-10-CM

## 2015-12-31 DIAGNOSIS — I10 Essential (primary) hypertension: Secondary | ICD-10-CM

## 2015-12-31 DIAGNOSIS — E785 Hyperlipidemia, unspecified: Secondary | ICD-10-CM | POA: Diagnosis not present

## 2015-12-31 LAB — LIPID PANEL
CHOL/HDL RATIO: 5
Cholesterol: 205 mg/dL — ABNORMAL HIGH (ref 0–200)
HDL: 45.1 mg/dL (ref >39.00)
LDL CALC: 143 mg/dL — AB (ref 0–99)
NONHDL: 159.56
Triglycerides: 81 mg/dL (ref 0.0–149.0)
VLDL: 16.2 mg/dL (ref 0.0–40.0)

## 2015-12-31 LAB — ALT: ALT: 24 U/L (ref 0–35)

## 2015-12-31 LAB — AST: AST: 24 U/L (ref 0–37)

## 2015-12-31 NOTE — Patient Instructions (Signed)
GO TO THE LAB : Get the blood work     GO TO THE FRONT DESK Schedule your next appointment for a  Check up in 8-9 months , no fasting     Take vitamin D between 600 units at 1000 units daily Take calcium 1 g daily  .  Check the  blood pressure 2 or 3 times a month   Be sure your blood pressure is between 110/65 and  135/85. If it is consistently higher or lower, let me know  Please consider visit these websites for more information about a healthcare power of attorney:  www.begintheconversation.org  theconversationproject.org    Fall Prevention and Home Safety Falls cause injuries and can affect all age groups. It is possible to use preventive measures to significantly decrease the likelihood of falls. There are many simple measures which can make your home safer and prevent falls. OUTDOORS  Repair cracks and edges of walkways and driveways.  Remove high doorway thresholds.  Trim shrubbery on the main path into your home.  Have good outside lighting.  Clear walkways of tools, rocks, debris, and clutter.  Check that handrails are not broken and are securely fastened. Both sides of steps should have handrails.  Have leaves, snow, and ice cleared regularly.  Use sand or salt on walkways during winter months.  In the garage, clean up grease or oil spills. BATHROOM  Install night lights.  Install grab bars by the toilet and in the tub and shower.  Use non-skid mats or decals in the tub or shower.  Place a plastic non-slip stool in the shower to sit on, if needed.  Keep floors dry and clean up all water on the floor immediately.  Remove soap buildup in the tub or shower on a regular basis.  Secure bath mats with non-slip, double-sided rug tape.  Remove throw rugs and tripping hazards from the floors. BEDROOMS  Install night lights.  Make sure a bedside light is easy to reach.  Do not use oversized bedding.  Keep a telephone by your bedside.  Have a firm  chair with side arms to use for getting dressed.  Remove throw rugs and tripping hazards from the floor. KITCHEN  Keep handles on pots and pans turned toward the center of the stove. Use back burners when possible.  Clean up spills quickly and allow time for drying.  Avoid walking on wet floors.  Avoid hot utensils and knives.  Position shelves so they are not too high or low.  Place commonly used objects within easy reach.  If necessary, use a sturdy step stool with a grab bar when reaching.  Keep electrical cables out of the way.  Do not use floor polish or wax that makes floors slippery. If you must use wax, use non-skid floor wax.  Remove throw rugs and tripping hazards from the floor. STAIRWAYS  Never leave objects on stairs.  Place handrails on both sides of stairways and use them. Fix any loose handrails. Make sure handrails on both sides of the stairways are as long as the stairs.  Check carpeting to make sure it is firmly attached along stairs. Make repairs to worn or loose carpet promptly.  Avoid placing throw rugs at the top or bottom of stairways, or properly secure the rug with carpet tape to prevent slippage. Get rid of throw rugs, if possible.  Have an electrician put in a light switch at the top and bottom of the stairs. OTHER FALL PREVENTION TIPS  Wear low-heel or rubber-soled shoes that are supportive and fit well. Wear closed toe shoes.  When using a stepladder, make sure it is fully opened and both spreaders are firmly locked. Do not climb a closed stepladder.  Add color or contrast paint or tape to grab bars and handrails in your home. Place contrasting color strips on first and last steps.  Learn and use mobility aids as needed. Install an electrical emergency response system.  Turn on lights to avoid dark areas. Replace light bulbs that burn out immediately. Get light switches that glow.  Arrange furniture to create clear pathways. Keep furniture  in the same place.  Firmly attach carpet with non-skid or double-sided tape.  Eliminate uneven floor surfaces.  Select a carpet pattern that does not visually hide the edge of steps.  Be aware of all pets. OTHER HOME SAFETY TIPS  Set the water temperature for 120 F (48.8 C).  Keep emergency numbers on or near the telephone.  Keep smoke detectors on every level of the home and near sleeping areas. Document Released: 07/23/2002 Document Revised: 02/01/2012 Document Reviewed: 10/22/2011 [facility] Patient Information 2015 [place], [place]. This information is not intended to replace advice given to you by your health care provider. Make sure you discuss any questions you have with your health care provider.   Preventive Care for Adults Ages 40 and over  Blood pressure check.** / Every 1 to 2 years.  Lipid and cholesterol check.**/ Every 5 years beginning at age 52.  Lung cancer screening. / Every year if you are aged 4-80 years and have a 30-pack-year history of smoking and currently smoke or have quit within the past 15 years. Yearly screening is stopped once you have quit smoking for at least 15 years or develop a health problem that would prevent you from having lung cancer treatment.  Fecal occult blood test (FOBT) of stool. / Every year beginning at age 68 and continuing until age 44. You may not have to do this test if you get a colonoscopy every 10 years.  Flexible sigmoidoscopy** or colonoscopy.** / Every 5 years for a flexible sigmoidoscopy or every 10 years for a colonoscopy beginning at age 88 and continuing until age 98.  Hepatitis C blood test.** / For all people born from [birth year] through 1965 and any individual with known risks for hepatitis C.  Abdominal aortic aneurysm (AAA) screening.** / A one-time screening for ages 27 to 84 years who are current or former smokers.  Skin self-exam. / Monthly.  Influenza vaccine. / Every year.  Tetanus, diphtheria, and acellular  pertussis (Tdap/Td) vaccine.** / 1 dose of Td every 10 years.  Varicella vaccine.** / Consult your health care provider.  Zoster vaccine.** / 1 dose for adults aged 60 years or older.  Pneumococcal 13-valent conjugate (PCV13) vaccine.** / Consult your health care provider.  Pneumococcal polysaccharide (PPSV23) vaccine.** / 1 dose for all adults aged 20 years and older.  Meningococcal vaccine.** / Consult your health care provider.  Hepatitis A vaccine.** / Consult your health care provider.  Hepatitis B vaccine.** / Consult your health care provider.  Haemophilus influenzae type b (Hib) vaccine.** / Consult your health care provider. **Family history and personal history of risk and conditions may change your health care provider's recommendations. Document Released: 09/28/2001 Document Revised: 08/07/2013 Document Reviewed: 12/28/2010 [facility] Patient Information 2015 [place], [place]. This information is not intended to replace advice given to you by your health care provider. Make sure you discuss any questions  you have with your health care provider.

## 2015-12-31 NOTE — Progress Notes (Signed)
Pre visit review using our clinic review tool, if applicable. No additional management support is needed unless otherwise documented below in the visit note. 

## 2015-12-31 NOTE — Assessment & Plan Note (Addendum)
Td 4-10; PNM and shingles -- declined    No further paps, see previous entries mammogram 12-2015 neg colonoscopy per patient aprox 2004, again 5-10---> neg, next 10 years  Encouraged healthy diet, stay active, calcium and vitamin D supplements

## 2015-12-31 NOTE — Progress Notes (Signed)
Subjective:    Patient ID: Katherine Koch, female    DOB: 1945/12/25, 70 y.o.   MRN: [id number]  DOS:  12/31/2015 Type of visit - description : Yearly checkup Interval history: In general feeling well, good med compliance, recent labs reviewed. GI note from few days ago checked, GI discomfort resolved. She remains active and is taking OTC vitamins and calcium.    Review of Systems  Constitutional: No fever. No chills. No unexplained wt changes. No unusual sweats  HEENT: No dental problems, no ear discharge, no facial swelling, no voice changes. No eye discharge, no eye  redness , no  intolerance to light   Respiratory: No wheezing , no  difficulty breathing. No cough , no mucus production  Cardiovascular: No CP, no leg swelling , no  Palpitations  GI: no nausea, no vomiting, no diarrhea , no  abdominal pain.  No blood in the stools. No dysphagia, no odynophagia    Endocrine: No polyphagia, no polyuria , no polydipsia  GU: No dysuria, gross hematuria, difficulty urinating. No urinary urgency, no frequency.  Musculoskeletal: No joint swellings or unusual aches or pains  Skin: No change in the color of the skin, palor , no  Rash  Allergic, immunologic: No environmental allergies , no  food allergies  Neurological: No dizziness no  syncope. No headaches. No diplopia, no slurred, no slurred speech, no motor deficits, no facial  Numbness  Hematological: No enlarged lymph nodes, no easy bruising , no unusual bleedings  Psychiatry: No suicidal ideas, no hallucinations, no beavior problems, no confusion.  No unusual/severe anxiety, no depression  Past Medical History  Diagnosis Date  . Hyperlipemia   . Hypertension   . Osteopenia     per pt had a DEXA few years ago  . Allergic rhinitis   . Shingles 3/10  . Dry eye syndrome     Past Surgical History  Procedure Laterality Date  . Breast lumpectomy Right     fibrocystic  . Abdominal hysterectomy      oophorectomy-  remotely    Social History   Social History  . Marital Status: Single    Spouse Name: N/A  . Number of Children: 1  . Years of Education: N/A   Occupational History  . retired      Lawyer   Social History Main Topics  . Smoking status: Former Smoker    Types: Cigarettes  . Smokeless tobacco: Never Used     Comment: quit 2011  . Alcohol Use: No  . Drug Use: No  . Sexual Activity: Not on file   Other Topics Concern  . Not on file   Social History Narrative   Divorced, retired Air cabin crew    Daughter in [place] to [place] from [place] 11/09                     Medication List       This list is accurate as of: 12/31/15 11:59 PM.  Always use your most recent med list.               amLODipine 10 MG tablet  Commonly known as:  NORVASC  Take 1 tablet (10 mg total) by mouth daily.     aspirin 81 MG tablet  Take 81 mg by mouth daily.     CALTRATE 600+D PO  Take by mouth.     chlorthalidone 25 MG tablet  Commonly known as:  HYGROTON  TAKE 1 TABLET BY MOUTH DAILY     fluticasone 50 MCG/ACT nasal spray  Commonly known as:  FLONASE  Place 2 sprays into both nostrils daily.     losartan 100 MG tablet  Commonly known as:  COZAAR  Take 1 tablet (100 mg total) by mouth daily.     metoprolol 50 MG tablet  Commonly known as:  LOPRESSOR  Take 1.5 tablets (75 mg total) by mouth 2 (two) times daily.     MULTIVITAMINS Chew  Chew 2 tablets by mouth daily.     potassium chloride SA 20 MEQ tablet  Commonly known as:  K-DUR,KLOR-CON  Take 1 tablet (20 mEq total) by mouth daily.     pravastatin 80 MG tablet  Commonly known as:  PRAVACHOL  TAKE 1 TABLET(80 MG) BY MOUTH EVERY EVENING     spironolactone 25 MG tablet  Commonly known as:  ALDACTONE  Take 12.5 mg by mouth daily.           Objective:   Physical Exam BP 124/70 mmHg  Pulse 60  Temp(Src) 98.1 F (36.7 C) (Oral)  Ht 5\' 6"  (1.676 m)  Wt 153 lb 8 oz (69.627 kg)  BMI 24.79 kg/m2   SpO2 98% General:   Well developed, well nourished . NAD.  Neck: No  thyromegaly  HEENT:  Normocephalic . Face symmetric, atraumatic Lungs:  CTA B Normal respiratory effort, no intercostal retractions, no accessory muscle use. Heart: RRR,  no murmur.  No pretibial edema bilaterally  Abdomen:  Not distended, soft, non-tender. No rebound or rigidity.   Skin: Exposed areas without rash. Not pale. Not jaundice Neurologic:  alert & oriented X3.  Speech normal, gait appropriate for age and unassisted Strength symmetric and appropriate for age.  Psych: Cognition and judgment appear intact.  Cooperative with normal attention span and concentration.  Behavior appropriate. No anxious or depressed appearing.     Assessment & Plan:   Assessment> HTN Hyperlipidemia Osteopenia : 02-2010: T score -2.4,  2013  Tscore -1.1 Allergic rhinitis Shingles ~2010 Dry eye syndrome  PLAN: HTN: Well-controlled, continue present care, recent BMP satisfactory High cholesterol: Cards increase Pravachol to 80 mg, good compliance and tolerance, check a FLP, AST, ALT today. Osteopenia: Continue with physical activity, calcium and vitamin D. Consider a bone density test next year. RTC 8-9 months.

## 2016-01-01 ENCOUNTER — Other Ambulatory Visit: Payer: Medicare Other

## 2016-01-01 NOTE — Assessment & Plan Note (Signed)
HTN: Well-controlled, continue present care, recent BMP satisfactory High cholesterol: Cards increase Pravachol to 80 mg, good compliance and tolerance, check a FLP, AST, ALT today. Osteopenia: Continue with physical activity, calcium and vitamin D. Consider a bone density test next year. RTC 8-9 months.

## 2016-01-06 ENCOUNTER — Encounter: Payer: Self-pay | Admitting: Cardiology

## 2016-01-06 ENCOUNTER — Ambulatory Visit (INDEPENDENT_AMBULATORY_CARE_PROVIDER_SITE_OTHER): Payer: Medicare Other | Admitting: Cardiology

## 2016-01-06 VITALS — BP 130/80 | HR 59 | Ht 66.0 in | Wt 155.4 lb

## 2016-01-06 DIAGNOSIS — I10 Essential (primary) hypertension: Secondary | ICD-10-CM | POA: Diagnosis not present

## 2016-01-06 DIAGNOSIS — E785 Hyperlipidemia, unspecified: Secondary | ICD-10-CM

## 2016-01-06 MED ORDER — ROSUVASTATIN CALCIUM 10 MG PO TABS: 10.0000 mg | ORAL_TABLET | Freq: Every day | ORAL | Status: DC

## 2016-01-06 MED ORDER — METOPROLOL TARTRATE 25 MG PO TABS: 25.0000 mg | ORAL_TABLET | Freq: Two times a day (BID) | ORAL | Status: DC

## 2016-01-06 NOTE — Patient Instructions (Addendum)
Medication Instructions:  Stop Pravastatin.  Start Rosuvastatin 10mg  daily  Decrease metoprolol tartrate (loporessor) to 25mg  two times a day.   Labwork: Your physician recommends that you return for a FASTING lipid profile /liver profile in about 2 months. This is scheduled for Tuesday July 25,2017. Do not eat or drink after midnight the night before the lab is done. The lab is open from 7:30AM-4:30PM.   Testing/Procedures: None   Follow-Up: Your physician wants you to follow-up in: 6 months with Dr Aundra Dubin. (November 2017).  You will receive a reminder letter in the mail two months in advance. If you don't receive a letter, please call our office to schedule the follow-up appointment.       If you need a refill on your cardiac medications before your next appointment, please call your pharmacy.

## 2016-01-07 NOTE — Progress Notes (Signed)
Patient ID: Katherine Koch, female   DOB: 1946-03-11, 70 y.o.   MRN: [id number] PCP: [name]  70 yo with history of resistant HTN presents for followup of HTN.  She has had HTN for years. HTN runs strongly in her family.  Last echo showed normal EF and severe LVH, likely due to long-standing HTN.  I have been adjusting her antihypertensive regimen and she has been exercising and losing weight.  She is down another 6 lbs.  BP still runs low at times (around 123XX123 systolic) and she feels lightheaded.  Feels like this is related to metoprolol.    No exertional dyspnea or chest pain.  No palpitations, lightheadedness, orthopnea or PND.  She snores and sometimes gasps in her sleep.   ECG: NSR, LVH with repolarization abnormality.   Labs (8/15): K 3.4, creatinine 0.8, LDL 148, HDL 33 Labs (1/16): K 3.6, creatinine 0.81 Labs (4/16): K 4.7, creatinine 1.01 Labs (9/16): K 4.7, creatinine 1.01 Labs (5/17): K 3.7, creatinine 0.76, LDL 143, HDL 45  PMH: 1. OA knees 2. Hysterectomy 3. Hyperlipidemia 4. HTN: Lethargy with clonidine use.  Resistant HTN x years.  Echo (8/15) with EF 60-65%, severe LVH, grade II diastolic dysfunction, mild MR.   FH: Mother HTN, sister HTN  SH: Single, moved to [place] from [place] to live near daughter.  Retired Chief Financial Officer.  Prior smoker, quit 2011.   ROS: All systems reviewed and negative except as per HPI.   Current Outpatient Prescriptions  Medication Sig Dispense Refill  . amLODipine (NORVASC) 10 MG tablet Take 1 tablet (10 mg total) by mouth daily. 90 tablet 1  . aspirin 81 MG tablet Take 81 mg by mouth daily.      . Calcium Carbonate-Vitamin D (CALTRATE 600+D PO) Take by mouth.      . chlorthalidone (HYGROTON) 25 MG tablet TAKE 1 TABLET BY MOUTH DAILY 30 tablet 6  . fluticasone (FLONASE) 50 MCG/ACT nasal spray Place 2 sprays into both nostrils daily. 16 g 6  . losartan (COZAAR) 100 MG tablet Take 1 tablet (100 mg total) by mouth daily. 90 tablet 1  . Multiple  Vitamins-Minerals (MULTIVITAMINS) CHEW Chew 2 tablets by mouth daily.    . potassium chloride SA (K-DUR,KLOR-CON) 20 MEQ tablet Take 1 tablet (20 mEq total) by mouth daily. 90 tablet 2  . spironolactone (ALDACTONE) 25 MG tablet Take 12.5 mg by mouth daily.    . [DISCONTINUED] pravastatin (PRAVACHOL) 80 MG tablet TAKE 1 TABLET(80 MG) BY MOUTH EVERY EVENING 30 tablet 2  . metoprolol tartrate (LOPRESSOR) 25 MG tablet Take 1 tablet (25 mg total) by mouth 2 (two) times daily. 180 tablet 2  . rosuvastatin (CRESTOR) 10 MG tablet Take 1 tablet (10 mg total) by mouth daily. 90 tablet 0   No current facility-administered medications for this visit.   BP 130/80 mmHg  Pulse 59  Ht 5\' 6"  (1.676 m)  Wt 155 lb 6.4 oz (70.489 kg)  BMI 25.09 kg/m2  General: NAD Neck: No JVD, no thyromegaly or thyroid nodule.  Lungs: Clear to auscultation bilaterally with normal respiratory effort. CV: Nondisplaced PMI.  Heart regular S1/S2, +S4, no murmur.  No edema.  No carotid bruit.  Normal pedal pulses.  Abdomen: Soft, nontender, no hepatosplenomegaly, no distention.  Skin: Intact without lesions or rashes.  Neurologic: Alert and oriented x 3.  Psych: Normal affect. Extremities: No clubbing or cyanosis.  HEENT: Normal.   Assessment/Plan: 1. HTN: Resistant HTN.  She did not tolerate clonidine due to  lethargy.  LVH on echo likely due to poor control of HTN.  Control is now much improved.  - Continue amlodipine, chlorthalidone, spironolactone, and losartan.  - She can decrease metoprolol to 25 mg bid. 2. OSA: Suspected.  This may be potentiating HTN.  She is not interested in getting a sleep study.    3. Hyperlipidemia: LDL was elevated on last labs.   - Stop pravastatin, start Crestor 10 mg daily with lipids/LFTs in 2 months.    Followup in 6 months.   [name] 01/07/2016

## 2016-02-16 ENCOUNTER — Other Ambulatory Visit: Payer: Self-pay | Admitting: Cardiology

## 2016-02-16 NOTE — Telephone Encounter (Signed)
.   potassium chloride SA (K-DUR,KLOR-CON) 20 MEQ tablet Take 1 tablet (20 mEq total) by mouth daily. 90 tablet 2  . spironolactone (ALDACTONE) 25 MG tablet Take 12.5 mg by mouth daily.    . [DISCONTINUED] pravastatin (PRAVACHOL) 80 MG tablet TAKE 1 TABLET(80 MG) BY MOUTH EVERY EVENING 30 tablet 2  . metoprolol tartrate (LOPRESSOR) 25 MG tablet Take 1 tablet (25 mg total) by mouth 2 (two) times daily. 180 tablet 2  . rosuvastatin (CRESTOR) 10 MG tablet Take 1 tablet (10 mg total) by mouth daily. 90 tablet 0   No current facility-administered medications for this visit.   BP 130/80 mmHg  Pulse 59  Ht 5\' 6"  (1.676 m)  Wt 155 lb 6.4 oz (70.489 kg)  BMI 25.09 kg/m2  General: NAD Neck: No JVD, no thyromegaly or thyroid nodule.  Lungs: Clear to auscultation bilaterally with normal respiratory effort. CV: Nondisplaced PMI. Heart regular S1/S2, +S4, no murmur. No edema. No carotid bruit. Normal pedal pulses.  Abdomen: Soft, nontender, no hepatosplenomegaly, no distention.  Skin: Intact without lesions or rashes.  Neurologic: Alert and oriented x 3.  Psych: Normal affect. Extremities: No clubbing or cyanosis.  HEENT: Normal.   Assessment/Plan: 1. HTN: Resistant HTN. She did not tolerate clonidine due to lethargy. LVH on echo likely due to poor control of HTN. Control is now much improved.  - Continue amlodipine, chlorthalidone, spironolactone, and losartan.  - She can decrease metoprolol to 25 mg bid. 2. OSA: Suspected. This may be potentiating HTN. She is not interested in getting a sleep study.  3. Hyperlipidemia: LDL was elevated on last labs.  - Stop pravastatin, start Crestor 10 mg daily with lipids/LFTs in 2 months.   Followup in 6 months.   [name] 01/07/2016

## 2016-03-08 ENCOUNTER — Telehealth: Payer: Self-pay | Admitting: Physician Assistant

## 2016-03-08 ENCOUNTER — Telehealth: Payer: Self-pay | Admitting: Cardiology

## 2016-03-08 NOTE — Telephone Encounter (Signed)
Mrs. Kious is asking if she can have her Glucose checked as well when she have her labs done on tomorrow . Please call   Thanks

## 2016-03-08 NOTE — Telephone Encounter (Signed)
Reviewed very briefly the results from the abdominal u/s. She still has her symptoms from time to time, but thinks maybe not too bad. She will make a record of symptoms and diet. With that information she will decide on scheduling an appointment with Dr Ardis Hughs.

## 2016-03-08 NOTE — Telephone Encounter (Signed)
Pt advised glucose was 93 in May 2017, will not recheck tomorrow.

## 2016-03-08 NOTE — Telephone Encounter (Signed)
Left message for patient to schedule follow up with Dr. Ardis Hughs.

## 2016-03-09 ENCOUNTER — Other Ambulatory Visit (INDEPENDENT_AMBULATORY_CARE_PROVIDER_SITE_OTHER): Payer: Medicare Other

## 2016-03-09 DIAGNOSIS — E785 Hyperlipidemia, unspecified: Secondary | ICD-10-CM | POA: Diagnosis not present

## 2016-03-09 DIAGNOSIS — I10 Essential (primary) hypertension: Secondary | ICD-10-CM

## 2016-03-09 LAB — LIPID PANEL
CHOL/HDL RATIO: 3.5 ratio (ref <=5.0)
CHOLESTEROL: 173 mg/dL (ref 125–200)
HDL: 50 mg/dL (ref >=46)
LDL Cholesterol: 110 mg/dL (ref <130)
TRIGLYCERIDES: 67 mg/dL (ref <150)
VLDL: 13 mg/dL (ref <30)

## 2016-03-09 LAB — HEPATIC FUNCTION PANEL
ALT: 22 U/L (ref 6–29)
AST: 26 U/L (ref 10–35)
Albumin: 4.6 g/dL (ref 3.6–5.1)
Alkaline Phosphatase: 52 U/L (ref 33–130)
BILIRUBIN DIRECT: 0.2 mg/dL (ref <=0.2)
BILIRUBIN INDIRECT: 0.8 mg/dL (ref 0.2–1.2)
TOTAL PROTEIN: 7.5 g/dL (ref 6.1–8.1)
Total Bilirubin: 1 mg/dL (ref 0.2–1.2)

## 2016-03-18 ENCOUNTER — Other Ambulatory Visit: Payer: Self-pay | Admitting: Cardiology

## 2016-03-18 ENCOUNTER — Other Ambulatory Visit: Payer: Self-pay

## 2016-03-18 DIAGNOSIS — I10 Essential (primary) hypertension: Secondary | ICD-10-CM

## 2016-03-18 DIAGNOSIS — E785 Hyperlipidemia, unspecified: Secondary | ICD-10-CM

## 2016-03-18 MED ORDER — AMLODIPINE BESYLATE 10 MG PO TABS: 10.0000 mg | ORAL_TABLET | Freq: Every day | ORAL | 1 refills | Status: DC

## 2016-03-22 ENCOUNTER — Telehealth: Payer: Self-pay | Admitting: Internal Medicine

## 2016-03-22 ENCOUNTER — Other Ambulatory Visit: Payer: Self-pay

## 2016-03-22 MED ORDER — LOSARTAN POTASSIUM 100 MG PO TABS: 100.0000 mg | ORAL_TABLET | Freq: Every day | ORAL | 1 refills | Status: DC

## 2016-03-22 NOTE — Telephone Encounter (Signed)
Medication Detail    Disp Refills Start End   losartan (COZAAR) 100 MG tablet 90 tablet 1 03/22/2016    Sig - Route: Take 1 tablet (100 mg total) by mouth daily. - Oral   E-Prescribing Status: Receipt confirmed by pharmacy (03/22/2016 10:06 AM EDT)

## 2016-03-22 NOTE — Telephone Encounter (Signed)
Relation to [id number] Call back number:[phone] Pharmacy: [facility], [place] - [address] [phone] (Phone) [phone] (Fax)     Reason for call:  Patient states pharmacy never received losartan (COZAAR) 100 MG tablet Rx today. Patient requesting contacting pharmacy directly. Please advise

## 2016-04-12 ENCOUNTER — Other Ambulatory Visit (HOSPITAL_COMMUNITY): Payer: Self-pay | Admitting: *Deleted

## 2016-04-12 DIAGNOSIS — I10 Essential (primary) hypertension: Secondary | ICD-10-CM

## 2016-04-12 MED ORDER — POTASSIUM CHLORIDE CRYS ER 20 MEQ PO TBCR: 20.0000 meq | EXTENDED_RELEASE_TABLET | Freq: Every day | ORAL | 2 refills | Status: DC

## 2016-05-21 ENCOUNTER — Encounter: Payer: Self-pay | Admitting: Internal Medicine

## 2016-05-21 ENCOUNTER — Other Ambulatory Visit: Payer: Self-pay | Admitting: Cardiology

## 2016-05-21 ENCOUNTER — Ambulatory Visit (INDEPENDENT_AMBULATORY_CARE_PROVIDER_SITE_OTHER): Payer: Medicare Other | Admitting: Internal Medicine

## 2016-05-21 VITALS — BP 122/78 | HR 64 | Temp 98.7°F | Ht 66.0 in | Wt 155.0 lb

## 2016-05-21 DIAGNOSIS — Z23 Encounter for immunization: Secondary | ICD-10-CM | POA: Diagnosis not present

## 2016-05-21 DIAGNOSIS — R739 Hyperglycemia, unspecified: Secondary | ICD-10-CM | POA: Diagnosis not present

## 2016-05-21 DIAGNOSIS — I10 Essential (primary) hypertension: Secondary | ICD-10-CM

## 2016-05-21 DIAGNOSIS — E785 Hyperlipidemia, unspecified: Secondary | ICD-10-CM | POA: Diagnosis not present

## 2016-05-21 LAB — HEMOGLOBIN A1C: Hgb A1c MFr Bld: 6 % (ref 4.6–6.5)

## 2016-05-21 NOTE — Progress Notes (Signed)
Pre visit review using our clinic review tool, if applicable. No additional management support is needed unless otherwise documented below in the visit note. 

## 2016-05-21 NOTE — Progress Notes (Signed)
Subjective:    Patient ID: Katherine Koch, female    DOB: June 14, 1946, 70 y.o.   MRN: [id number]  DOS:  05/21/2016 Type of visit - description : check up Interval history: Patient request a checkup today. Like to be screened for diabetes. Also request a flu shot. In general feeling well, she remains active.   Review of Systems  Denies chest pain or difficulty breathing Has a occasional right-sided abdominal discomfort for, varies depending on position. No blood in the stools No nausea, vomiting, diarrhea.  Past Medical History:  Diagnosis Date  . Allergic rhinitis   . Dry eye syndrome   . Hyperlipemia   . Hypertension   . Osteopenia    per pt had a DEXA few years ago  . Shingles 3/10    Past Surgical History:  Procedure Laterality Date  . ABDOMINAL HYSTERECTOMY     oophorectomy- remotely  . BREAST LUMPECTOMY Right    fibrocystic    Social History   Social History  . Marital status: Single    Spouse name: N/A  . Number of children: 1  . Years of education: N/A   Occupational History  . retired  Retired    Lawyer   Social History Main Topics  . Smoking status: Former Smoker    Types: Cigarettes  . Smokeless tobacco: Never Used     Comment: quit 2011  . Alcohol use No  . Drug use: No  . Sexual activity: Not on file   Other Topics Concern  . Not on file   Social History Narrative   Divorced, retired Air cabin crew    Daughter in [place] to [place] from [place] 11/09                     Medication List       Accurate as of 05/21/16  1:16 PM. Always use your most recent med list.          amLODipine 10 MG tablet Commonly known as:  NORVASC Take 1 tablet (10 mg total) by mouth daily.   aspirin 81 MG tablet Take 81 mg by mouth daily.   CALTRATE 600+D PO Take by mouth.   chlorthalidone 25 MG tablet Commonly known as:  HYGROTON TAKE 1 TABLET BY MOUTH DAILY   fluticasone 50 MCG/ACT nasal spray Commonly known as:   FLONASE Place 2 sprays into both nostrils daily.   losartan 100 MG tablet Commonly known as:  COZAAR Take 1 tablet (100 mg total) by mouth daily.   metoprolol tartrate 25 MG tablet Commonly known as:  LOPRESSOR Take 1 tablet (25 mg total) by mouth 2 (two) times daily.   MULTIVITAMINS Chew Chew 2 tablets by mouth daily.   potassium chloride SA 20 MEQ tablet Commonly known as:  K-DUR,KLOR-CON Take 1 tablet (20 mEq total) by mouth daily.   rosuvastatin 10 MG tablet Commonly known as:  CRESTOR TAKE 1 TABLET(10 MG) BY MOUTH DAILY   spironolactone 25 MG tablet Commonly known as:  ALDACTONE Take 0.5 tablets (12.5 mg total) by mouth daily.   terconazole 0.4 % vaginal cream Commonly known as:  TERAZOL 7 IVB FOR 7 NIGHTS          Objective:   Physical Exam BP 122/78 (BP Location: Left Arm, Patient Position: Sitting, Cuff Size: Normal)   Pulse 64   Temp 98.7 F (37.1 C) (Oral)   Ht 5\' 6"  (1.676 m)   Wt 155 lb (70.3 kg)  SpO2 98%   BMI 25.02 kg/m  General:   Well developed, well nourished . NAD.  HEENT:  Normocephalic . Face symmetric, atraumatic Lungs:  CTA B Normal respiratory effort, no intercostal retractions, no accessory muscle use. Heart: RRR,  no murmur.  No pretibial edema bilaterally  Skin: Not pale. Not jaundice Neurologic:  alert & oriented X3.  Speech normal, gait appropriate for age and unassisted Psych--  Cognition and judgment appear intact.  Cooperative with normal attention span and concentration.  Behavior appropriate. No anxious or depressed appearing.      Assessment & Plan:   Assessment  HTN Hyperlipidemia Osteopenia : 02-2010: T score -2.4,  2013  Tscore -1.1 Allergic rhinitis Shingles ~2010 Dry eye syndrome  PLAN: HTN: Controlled, continue amlodipine, chlorthalidone, losartan, Lopressor, potassium, Aldactone. Last BMP satisfactory. Hyperlipidemia: Was taking Pravachol, eventually switch to Crestor, last FLP  improved. Hyperglycemia: Mild hyperglycemia, check an A1c, continue with healthy lifestyle Flu shot today RTC 6 months

## 2016-05-21 NOTE — Patient Instructions (Signed)
GO TO THE LAB : Get the blood work     GO TO THE FRONT DESK Schedule your next appointment for a  Check up in 6 months   

## 2016-05-21 NOTE — Assessment & Plan Note (Signed)
HTN: Controlled, continue amlodipine, chlorthalidone, losartan, Lopressor, potassium, Aldactone. Last BMP satisfactory. Hyperlipidemia: Was taking Pravachol, eventually switch to Crestor, last FLP improved. Hyperglycemia: Mild hyperglycemia, check an A1c, continue with healthy lifestyle Flu shot today RTC 6 months

## 2016-05-26 ENCOUNTER — Ambulatory Visit: Payer: Medicare Other | Admitting: Gastroenterology

## 2016-05-27 ENCOUNTER — Encounter: Payer: Self-pay | Admitting: Internal Medicine

## 2016-05-27 ENCOUNTER — Telehealth: Payer: Self-pay | Admitting: Internal Medicine

## 2016-05-27 NOTE — Telephone Encounter (Signed)
Spoke with patient about her lab results. She voices understanding

## 2016-05-27 NOTE — Telephone Encounter (Signed)
Pt called in to get her A1C results. Please call pt back at   [phone] when possible.

## 2016-05-27 NOTE — Telephone Encounter (Signed)
error:[id number] ° °

## 2016-05-27 NOTE — Telephone Encounter (Signed)
Patient inquring about her A1C results. Patient would like to speak with a nurse before 12. Please advise best # [phone]

## 2016-06-15 ENCOUNTER — Encounter: Payer: Self-pay | Admitting: Cardiology

## 2016-06-29 ENCOUNTER — Encounter: Payer: Self-pay | Admitting: Cardiology

## 2016-06-29 ENCOUNTER — Ambulatory Visit (INDEPENDENT_AMBULATORY_CARE_PROVIDER_SITE_OTHER): Payer: Medicare Other | Admitting: Cardiology

## 2016-06-29 VITALS — BP 132/88 | HR 66 | Ht 67.0 in | Wt 150.1 lb

## 2016-06-29 DIAGNOSIS — E785 Hyperlipidemia, unspecified: Secondary | ICD-10-CM | POA: Diagnosis not present

## 2016-06-29 DIAGNOSIS — I10 Essential (primary) hypertension: Secondary | ICD-10-CM | POA: Diagnosis not present

## 2016-06-29 LAB — BASIC METABOLIC PANEL
BUN: 18 mg/dL (ref 7–25)
CALCIUM: 10.3 mg/dL (ref 8.6–10.4)
CHLORIDE: 103 mmol/L (ref 98–110)
CO2: 28 mmol/L (ref 20–31)
CREATININE: 0.84 mg/dL (ref 0.60–0.93)
GLUCOSE: 87 mg/dL (ref 65–99)
Potassium: 4.3 mmol/L (ref 3.5–5.3)
Sodium: 139 mmol/L (ref 135–146)

## 2016-06-29 MED ORDER — ROSUVASTATIN CALCIUM 5 MG PO TABS: 5.0000 mg | ORAL_TABLET | Freq: Every day | ORAL | 3 refills | Status: DC

## 2016-06-29 NOTE — Patient Instructions (Signed)
Medication Instructions:  Stop metoprolol.  Decrease crestor to 5mg  daily  Labwork: BMET today  Testing/Procedures: none  Follow-Up: Your physician wants you to follow-up in: 1 year with Dr Skeet Latch. -this will be at the [facility]. (November 2018). You will receive a reminder letter in the mail two months in advance. If you don't receive a letter, please call our office to schedule the follow-up appointment.       If you need a refill on your cardiac medications before your next appointment, please call your pharmacy.

## 2016-06-30 NOTE — Progress Notes (Signed)
Patient ID: Katherine Koch, female   DOB: 27-Mar-1946, 70 y.o.   MRN: [id number] PCP: Dr. Larose Koch  70 yo with history of resistant HTN presents for followup of HTN.  She has had HTN for years. HTN runs strongly in her family.  Last echo showed normal EF and severe LVH, likely due to long-standing HTN.  I have been adjusting her antihypertensive regimen and she has been exercising and losing weight.  She is down another 5 lbs.  SBP < 120 now at home.      No exertional dyspnea or chest pain.  No palpitations, lightheadedness, orthopnea or PND.  She snores and sometimes gasps in her sleep.  She wants to stop Crestor as she's concerned it's increasing her blood glucose.   ECG: NSR, LVH with repolarization abnormality.   Labs (8/15): K 3.4, creatinine 0.8, LDL 148, HDL 33 Labs (1/16): K 3.6, creatinine 0.81 Labs (4/16): K 4.7, creatinine 1.01 Labs (9/16): K 4.7, creatinine 1.01 Labs (5/17): K 3.7, creatinine 0.76, LDL 143, HDL 45 Labs (7/17): LDL 110, HDL 50, K 3.7, creatinine 0.76  PMH: 1. OA knees 2. Hysterectomy 3. Hyperlipidemia 4. HTN: Lethargy with clonidine use.  Resistant HTN x years.  Echo (8/15) with EF 60-65%, severe LVH, grade II diastolic dysfunction, mild MR.   FH: Mother HTN, sister HTN  SH: Single, moved to [place] from [place] to live near daughter.  Retired Chief Financial Officer.  Prior smoker, quit 2011.   ROS: All systems reviewed and negative except as per HPI.   Current Outpatient Prescriptions  Medication Sig Dispense Refill  . amLODipine (NORVASC) 10 MG tablet Take 1 tablet (10 mg total) by mouth daily. 90 tablet 1  . aspirin 81 MG tablet Take 81 mg by mouth daily.      . Calcium Carbonate-Vitamin D (CALTRATE 600+D PO) Take by mouth as directed.     . chlorthalidone (HYGROTON) 25 MG tablet TAKE 1 TABLET BY MOUTH DAILY 30 tablet 6  . fluticasone (FLONASE) 50 MCG/ACT nasal spray Place 2 sprays into both nostrils daily. 16 g 6  . losartan (COZAAR) 100 MG tablet Take 1 tablet (100 mg total) by  mouth daily. 90 tablet 1  . Multiple Vitamins-Minerals (MULTIVITAMINS) CHEW Chew 1 tablet by mouth daily.     . potassium chloride SA (K-DUR,KLOR-CON) 20 MEQ tablet Take 1 tablet (20 mEq total) by mouth daily. 90 tablet 2  . spironolactone (ALDACTONE) 25 MG tablet Take 0.5 tablets (12.5 mg total) by mouth daily. 15 tablet 9  . terconazole (TERAZOL 7) 0.4 % vaginal cream USE FOR 7 NIGHTS AS DIRECTED  0  . rosuvastatin (CRESTOR) 5 MG tablet Take 1 tablet (5 mg total) by mouth daily. 90 tablet 3   No current facility-administered medications for this visit.    BP 132/88   Pulse 66   Ht 5\' 7"  (1.702 m)   Wt 150 lb 1.9 oz (68.1 kg)   BMI 23.51 kg/m   General: NAD Neck: No JVD, no thyromegaly or thyroid nodule.  Lungs: Clear to auscultation bilaterally with normal respiratory effort. CV: Nondisplaced PMI.  Heart regular S1/S2, +S4, no murmur.  No edema.  No carotid bruit.  Normal pedal pulses.  Abdomen: Soft, nontender, no hepatosplenomegaly, no distention.  Skin: Intact without lesions or rashes.  Neurologic: Alert and oriented x 3.  Psych: Normal affect. Extremities: No clubbing or cyanosis.  HEENT: Normal.   Assessment/Plan: 1. HTN: Resistant HTN but control improved with weight loss.  She did not tolerate  clonidine due to lethargy.  LVH on echo likely due to poor control of HTN.    - Continue amlodipine, chlorthalidone, spironolactone, and losartan.  - I think that she can stop metoprolol.  - BMET today.  2. OSA: Suspected.  This may be potentiating HTN.  She is not interested in getting a sleep study.    3. Hyperlipidemia: LDL was acceptable on last lipid panel.  She is adamant that she wants to decrease Crestor dose. I will have her cut back to Crestor 5 mg daily.    Followup in 1 year.  She will see Dr Katherine Koch given my transition to CHF clinic.   [name] 06/30/2016

## 2016-07-06 ENCOUNTER — Telehealth: Payer: Self-pay | Admitting: *Deleted

## 2016-07-06 NOTE — Telephone Encounter (Signed)
If her HR < 100, it is ok.  However, if she is more comfortable going back on metoprolol 12.5 mg bid, she can.

## 2016-07-06 NOTE — Telephone Encounter (Signed)
LMTCB

## 2016-07-06 NOTE — Telephone Encounter (Signed)
Pt states metoprolol was stopped at office visit with Dr Aundra Dubin 06/29/16. Pt states this morning she felt her heart rate was elevated, she checked it and it was 93. Pt states she went ahead and took 1/2 of a 25mg  metoprolol.  Pt states BP was elevated at first but came down to 136/77, heart rate was 82.  Pt advised I will forward to Dr Aundra Dubin for review.

## 2016-07-07 MED ORDER — METOPROLOL TARTRATE 25 MG PO TABS: ORAL_TABLET | ORAL | 3 refills | Status: DC

## 2016-07-07 NOTE — Telephone Encounter (Signed)
Discussed with pt, pt feels more comfortable restarting metoprolol 12.5mg  bid.

## 2016-08-20 ENCOUNTER — Ambulatory Visit (INDEPENDENT_AMBULATORY_CARE_PROVIDER_SITE_OTHER): Payer: Medicare Other | Admitting: Gastroenterology

## 2016-08-20 ENCOUNTER — Encounter: Payer: Self-pay | Admitting: Gastroenterology

## 2016-08-20 ENCOUNTER — Ambulatory Visit: Payer: Medicare Other | Admitting: Gastroenterology

## 2016-08-20 VITALS — BP 182/82 | HR 66 | Ht 66.25 in | Wt 148.7 lb

## 2016-08-20 DIAGNOSIS — R1031 Right lower quadrant pain: Secondary | ICD-10-CM

## 2016-08-20 DIAGNOSIS — G8929 Other chronic pain: Secondary | ICD-10-CM

## 2016-08-20 NOTE — Patient Instructions (Signed)
Please call back if you want to schedule any testing for the RLQ discomfort (such at CT scan, then colonoscopy).

## 2016-08-20 NOTE — Progress Notes (Signed)
Review of pertinent gastrointestinal problems: 1. Routine risk for colon cancer:  Screening colonoscopy 12/2008 Dr. Ardis Koch was normal. REcommended to have repeat screening at 10 year interval. 2. RLQ pain 2017 evaluation by GI; CBC, cmet were normal. She was recommended to have CT scan but declined. Eventual abd and pelvic ultrasounds were done and these were normal, non-revealing.    HPI: This is a pleasant 71 year old woman who was here in our office this past spring. She saw Katherine Koch would. She is here for essentially the same complaint  Chief complaint is right lower quadrant discomfort  She has lost 8 pounds in 7-8 months since last visit here in GI.  This is intentional,  she  changed her diet.  Still has RLQ discomfort.  It is not constant.  Can be worse after eating particular foods.  It is NOT a pain, just an annoying.  Having occasional fecal leakage.  She stopped crestor and that helped the leakage a bit.  Has a rash on her right hand, improved after diflucan she took for vaginal yeast infection.  Having recurrent vaginal yeast infections.  ROS: complete GI ROS as described in HPI.  Constitutional:  No unintentional weight loss   Past Medical History:  Diagnosis Date  . Allergic rhinitis   . Dry eye syndrome   . Hyperlipemia   . Hypertension   . Osteopenia    per pt had a DEXA few years ago  . Shingles 3/10    Past Surgical History:  Procedure Laterality Date  . ABDOMINAL HYSTERECTOMY     oophorectomy- remotely  . BREAST LUMPECTOMY Right    fibrocystic    Current Outpatient Prescriptions  Medication Sig Dispense Refill  . amLODipine (NORVASC) 10 MG tablet Take 1 tablet (10 mg total) by mouth daily. 90 tablet 1  . aspirin 81 MG tablet Take 81 mg by mouth daily.      . Calcium Carbonate-Vitamin D (CALTRATE 600+D PO) Take by mouth as directed.     . chlorthalidone (HYGROTON) 25 MG tablet TAKE 1 TABLET BY MOUTH DAILY 30 tablet 6  . fluticasone (FLONASE) 50  MCG/ACT nasal spray Place 2 sprays into both nostrils daily. 16 g 6  . losartan (COZAAR) 100 MG tablet Take 1 tablet (100 mg total) by mouth daily. 90 tablet 1  . metoprolol tartrate (LOPRESSOR) 25 MG tablet 1/2 tablet (12.5mg ) two times a day 90 tablet 3  . Multiple Vitamins-Minerals (MULTIVITAMINS) CHEW Chew 1 tablet by mouth daily.     . potassium chloride SA (K-DUR,KLOR-CON) 20 MEQ tablet Take 1 tablet (20 mEq total) by mouth daily. 90 tablet 2  . spironolactone (ALDACTONE) 25 MG tablet Take 0.5 tablets (12.5 mg total) by mouth daily. 15 tablet 9  . terconazole (TERAZOL 7) 0.4 % vaginal cream USE FOR 7 NIGHTS AS DIRECTED  0  . rosuvastatin (CRESTOR) 5 MG tablet Take 1 tablet (5 mg total) by mouth daily. (Patient not taking: Reported on 08/20/2016) 90 tablet 3   No current facility-administered medications for this visit.     Allergies as of 08/20/2016  . (No Known Allergies)    Family History  Problem Relation Age of Onset  . Coronary artery disease Mother     onset?  Marland Kitchen Hypertension Mother   . Non-Hodgkin's lymphoma Mother     ? due to Lipitor  . Hypertension Brother   . Diabetes Neg Hx   . Stroke Neg Hx   . Colon cancer Neg Hx   .  Breast cancer Neg Hx     Social History   Social History  . Marital status: Single    Spouse name: N/A  . Number of children: 1  . Years of education: N/A   Occupational History  . retired  Retired    Lawyer   Social History Main Topics  . Smoking status: Former Smoker    Types: Cigarettes  . Smokeless tobacco: Never Used     Comment: quit 2011  . Alcohol use No  . Drug use: No  . Sexual activity: Not on file   Other Topics Concern  . Not on file   Social History Narrative   Divorced, retired Air cabin crew    Daughter in [place] to [place] from [place] 11/09                  Physical Exam: BP (!) 182/82   Pulse 66   Ht 5' 6.25" (1.683 m)   Wt 148 lb 11.2 oz (67.4 kg)   BMI 23.82 kg/m  Constitutional:  generally well-appearing Psychiatric: alert and oriented x3 Abdomen: soft, nontender, nondistended, no obvious ascites, no peritoneal signs, normal bowel sounds No peripheral edema noted in lower extremities  Assessment and plan: 71 y.o. female witmild, underlying right lower quadrant discomfort  she had the same complaint many months ago. CBC and complete metabolic profile were normal. She was recommended to have CT scan is the first imaging test of choice however she was not interested in that. Pelvic and abdominal ultrasounds were performed and they were essentially unrevealing. I explained to her think it is very unlikely this represents anything serious. She has had abdominal hysterectomy perhaps she is experiencing some pinching, pulling from adhesions. I told her that she was still very concerned that Test of choice would be an abdominal CT scan area she is not interested in that right now. But she will think about it.   Katherine Loffler, MD [facility] 08/20/2016, 1:36 PM

## 2016-08-30 ENCOUNTER — Telehealth: Payer: Self-pay | Admitting: *Deleted

## 2016-08-30 NOTE — Telephone Encounter (Signed)
Called patient and left message to return call

## 2016-09-20 ENCOUNTER — Encounter: Payer: Self-pay | Admitting: *Deleted

## 2016-09-20 NOTE — Telephone Encounter (Signed)
This encounter was created in error - please disregard.

## 2016-09-29 ENCOUNTER — Other Ambulatory Visit: Payer: Self-pay | Admitting: Internal Medicine

## 2016-12-15 ENCOUNTER — Other Ambulatory Visit: Payer: Self-pay | Admitting: Cardiovascular Disease

## 2016-12-15 ENCOUNTER — Other Ambulatory Visit: Payer: Self-pay | Admitting: Cardiology

## 2017-01-03 NOTE — Progress Notes (Deleted)
Subjective:   Katherine Koch is a 71 y.o. female who presents for Medicare Annual (Subsequent) preventive examination.  Review of Systems:  No ROS.  Medicare Wellness Visit.   Sleep patterns:  Home Safety/Smoke Alarms:   Living environment; residence and Firearm Safety:  Seat Belt Safety/Bike Helmet: Wears seat belt.   Counseling:   Eye Exam-  Dental-  Female:   Pap- Hysterectomy      Mammo-   Last 12/19/15: BI-RADS CATEGORY  1: Negative.    Dexa scan-  Last 11/15/13: osteopenia      CCS- Last 12/20/08: normal     Objective:     Vitals: There were no vitals taken for this visit.  There is no height or weight on file to calculate BMI.   Tobacco History  Smoking Status  . Former Smoker  . Types: Cigarettes  Smokeless Tobacco  . Never Used    Comment: quit 2011     Counseling given: Not Answered   Past Medical History:  Diagnosis Date  . Allergic rhinitis   . Dry eye syndrome   . Hyperlipemia   . Hypertension   . Osteopenia    per pt had a DEXA few years ago  . Shingles 3/10   Past Surgical History:  Procedure Laterality Date  . ABDOMINAL HYSTERECTOMY     oophorectomy- remotely  . BREAST LUMPECTOMY Right    fibrocystic   Family History  Problem Relation Age of Onset  . Coronary artery disease Mother        onset?  Marland Kitchen Hypertension Mother   . Non-Hodgkin's lymphoma Mother        ? due to Lipitor  . Hypertension Brother   . Diabetes Neg Hx   . Stroke Neg Hx   . Colon cancer Neg Hx   . Breast cancer Neg Hx    History  Sexual Activity  . Sexual activity: Not on file    Outpatient Encounter Prescriptions as of 01/04/2017  Medication Sig  . amLODipine (NORVASC) 10 MG tablet Take 1 tablet (10 mg total) by mouth daily.  Marland Kitchen aspirin 81 MG tablet Take 81 mg by mouth daily.    . Calcium Carbonate-Vitamin D (CALTRATE 600+D PO) Take by mouth as directed.   . chlorthalidone (HYGROTON) 25 MG tablet TAKE 1 TABLET BY MOUTH DAILY  . fluticasone (FLONASE) 50 MCG/ACT  nasal spray Place 2 sprays into both nostrils daily.  Marland Kitchen losartan (COZAAR) 100 MG tablet Take 1 tablet (100 mg total) by mouth daily.  . metoprolol tartrate (LOPRESSOR) 25 MG tablet 1/2 tablet (12.5mg ) two times a day  . Multiple Vitamins-Minerals (MULTIVITAMINS) CHEW Chew 1 tablet by mouth daily.   . potassium chloride SA (K-DUR,KLOR-CON) 20 MEQ tablet Take 1 tablet (20 mEq total) by mouth daily.  . rosuvastatin (CRESTOR) 5 MG tablet Take 1 tablet (5 mg total) by mouth daily. (Patient not taking: Reported on 08/20/2016)  . spironolactone (ALDACTONE) 25 MG tablet TAKE 1/2 TABLET BY MOUTH DAILY  . terconazole (TERAZOL 7) 0.4 % vaginal cream USE FOR 7 NIGHTS AS DIRECTED   No facility-administered encounter medications on file as of 01/04/2017.     Activities of Daily Living No flowsheet data found.  Patient Care Team: Colon Branch, MD as PCP - General Larey Dresser, MD as Consulting Physician (Cardiology) Leotis Pain as Physician Assistant (Gastroenterology) Milus Banister, MD as Attending Physician (Gastroenterology)    Assessment:    Physical assessment deferred to PCP.  Exercise  Activities and Dietary recommendations   Diet (meal preparation, eat out, water intake, caffeinated beverages, dairy products, fruits and vegetables): {Desc; diets:16563} Breakfast: Lunch:  Dinner:      Goals    None     Fall Risk Fall Risk  12/31/2015 05/28/2015 03/21/2014 02/23/2013  Falls in the past year? No No No No   Depression Screen PHQ 2/9 Scores 12/31/2015 05/28/2015 03/21/2014 02/23/2013  PHQ - 2 Score 0 0 0 0     Cognitive Function        Immunization History  Administered Date(s) Administered  . Influenza, High Dose Seasonal PF 05/28/2015, 05/21/2016  . Td 11/19/2008   Screening Tests Health Maintenance  Topic Date Due  . Hepatitis C Screening  March 23, 1946  . PNA vac Low Risk Adult (1 of 2 - PCV13) 02/21/2011  . DEXA SCAN  11/16/2015  . MAMMOGRAM  12/18/2016  .  INFLUENZA VACCINE  03/16/2017  . TETANUS/TDAP  11/20/2018  . COLONOSCOPY  12/21/2018      Plan:   ***   I have personally reviewed and noted the following in the patient's chart:   . Medical and social history . Use of alcohol, tobacco or illicit drugs  . Current medications and supplements . Functional ability and status . Nutritional status . Physical activity . Advanced directives . List of other physicians . Hospitalizations, surgeries, and ER visits in previous 12 months . Vitals . Screenings to include cognitive, depression, and falls . Referrals and appointments  In addition, I have reviewed and discussed with patient certain preventive protocols, quality metrics, and best practice recommendations. A written personalized care plan for preventive services as well as general preventive health recommendations were provided to patient.     Shela Nevin, [place]  01/03/2017

## 2017-01-04 ENCOUNTER — Encounter: Payer: Self-pay | Admitting: Internal Medicine

## 2017-01-04 ENCOUNTER — Ambulatory Visit (INDEPENDENT_AMBULATORY_CARE_PROVIDER_SITE_OTHER): Payer: Medicare Other | Admitting: Internal Medicine

## 2017-01-04 ENCOUNTER — Telehealth: Payer: Self-pay | Admitting: Internal Medicine

## 2017-01-04 VITALS — BP 126/78 | HR 81 | Temp 98.1°F | Resp 14 | Ht 66.0 in | Wt 138.5 lb

## 2017-01-04 DIAGNOSIS — R739 Hyperglycemia, unspecified: Secondary | ICD-10-CM | POA: Diagnosis not present

## 2017-01-04 DIAGNOSIS — H9209 Otalgia, unspecified ear: Secondary | ICD-10-CM | POA: Diagnosis not present

## 2017-01-04 DIAGNOSIS — E785 Hyperlipidemia, unspecified: Secondary | ICD-10-CM | POA: Diagnosis not present

## 2017-01-04 DIAGNOSIS — I10 Essential (primary) hypertension: Secondary | ICD-10-CM

## 2017-01-04 DIAGNOSIS — Z0000 Encounter for general adult medical examination without abnormal findings: Secondary | ICD-10-CM

## 2017-01-04 DIAGNOSIS — M8580 Other specified disorders of bone density and structure, unspecified site: Secondary | ICD-10-CM

## 2017-01-04 MED ORDER — AMLODIPINE BESYLATE 10 MG PO TABS: 10.0000 mg | ORAL_TABLET | Freq: Every day | ORAL | 2 refills | Status: DC

## 2017-01-04 MED ORDER — LOSARTAN POTASSIUM 100 MG PO TABS: 100.0000 mg | ORAL_TABLET | Freq: Every day | ORAL | 2 refills | Status: DC

## 2017-01-04 NOTE — Telephone Encounter (Signed)
Caller name: Relationship to patient: Self Can be reached: [phone]  Pharmacy:  [place], [place] - [address] AT [address] [phone] (Phone) [phone] (Fax)     Reason for call: Request refill on amLODipine (NORVASC) 10 MG tablet   losartan (COZAAR) 100 MG tablet

## 2017-01-04 NOTE — Assessment & Plan Note (Addendum)
--  Td 4-10; PNM and shingles : declined again today --No further paps, see previous entries. She does sees gyn --mammogram 12-2015 neg --CCS: colonoscopy per patient aprox 2004, again 5-10---> neg, next 10 years  --doing great w/ diet-exercise after  she learn about a  A1C of 6.0  --We'll check a CMP, CBC, A1c. Declined hep C

## 2017-01-04 NOTE — Assessment & Plan Note (Signed)
Hyperglycemia: A1c 6.0 October 2017, she change her diet, losing weight. Recheck a A1c HTN: Well-controlled on amlodipine, chlorthalidone, losartan, Lopressor, potassium and Aldactone. Checking labs Hyperlipidemia: Has taking Crestor on and off and eventually stop it 2 weeks ago, she believes it was triggering vaginal yeast infections. We agreed not to check a cholesterol panel today, recheck in 4 months. She is willing to take pravastatin if necessary. Abdominal pain: Still there but overall better after she was treated for a yeast infection. GI note reviewed, recommend to call them if she still hurts, she may like to pursue a CT abdomen. Otalgia: Left side, exam normal, likely TMJ, recommend to discuss with dentist. Osteopenia: declined dexa, will call when ready RTC 4 months

## 2017-01-04 NOTE — Progress Notes (Signed)
Subjective:    Patient ID: Katherine Koch, female    DOB: 08-15-1946, 71 y.o.   MRN: [id number]  DOS:  01/04/2017 Type of visit - description : CPX Interval history: We also manage her chronic medical problems. Self discontinue Crestor because she felt it was causing yeast infections. Multiple yeast infections and bacterial infections, finally got better after terconazole, again she believes is due to taking Crestor.  Wt Readings from Last 3 Encounters:  01/04/17 138 lb 8 oz (62.8 kg)  08/20/16 148 lb 11.2 oz (67.4 kg)  06/29/16 150 lb 1.9 oz (68.1 kg)    Review of Systems  C/o left ear ache, for a while, hurts all the time. Chronic rash of hands unchanged On and off pain and swelling at the knees unchanged. Still has on and off abdominal discomfort,  although overall feels better after yeast infections were treated.   Other than above, a 14 Koch review of systems is negative     Past Medical History:  Diagnosis Date  . Allergic rhinitis   . Dry eye syndrome   . Hyperlipemia   . Hypertension   . Osteopenia    per pt had a DEXA few years ago  . Shingles 3/10    Past Surgical History:  Procedure Laterality Date  . ABDOMINAL HYSTERECTOMY     B oophorectomy- remotely  . BREAST LUMPECTOMY Right    fibrocystic    Social History   Social History  . Marital status: Single    Spouse name: N/A  . Number of children: 1  . Years of education: N/A   Occupational History  . retired  Retired    Lawyer   Social History Main Topics  . Smoking status: Former Smoker    Types: Cigarettes  . Smokeless tobacco: Never Used     Comment: quit 2011, 1/2 ppd , age of onset? smoked off-on  . Alcohol use No  . Drug use: No  . Sexual activity: Not on file   Other Topics Concern  . Not on file   Social History Narrative   Divorced, retired Air cabin crew    Daughter in [place] to [place] from [place] 11/09                  Family History  Problem Relation  Age of Onset  . Coronary artery disease Mother        onset?  Marland Kitchen Hypertension Mother   . Non-Hodgkin's lymphoma Mother        ? due to Lipitor  . Hypertension Brother   . Diabetes Neg Hx   . Stroke Neg Hx   . Colon cancer Neg Hx   . Breast cancer Neg Hx      Allergies as of 01/04/2017   No Known Allergies     Medication List       Accurate as of 01/04/17  6:41 PM. Always use your most recent med list.          amLODipine 10 MG tablet Commonly known as:  NORVASC Take 1 tablet (10 mg total) by mouth daily.   aspirin 81 MG tablet Take 81 mg by mouth daily.   CALTRATE 600+D PO Take by mouth as directed.   chlorthalidone 25 MG tablet Commonly known as:  HYGROTON TAKE 1 TABLET BY MOUTH DAILY   fluticasone 50 MCG/ACT nasal spray Commonly known as:  FLONASE Place 2 sprays into both nostrils daily.   losartan 100 MG tablet  Commonly known as:  COZAAR Take 1 tablet (100 mg total) by mouth daily.   metoprolol tartrate 25 MG tablet Commonly known as:  LOPRESSOR 1/2 tablet (12.5mg ) two times a day   MULTIVITAMINS Chew Chew 1 tablet by mouth daily.   potassium chloride SA 20 MEQ tablet Commonly known as:  K-DUR,KLOR-CON Take 1 tablet (20 mEq total) by mouth daily.   spironolactone 25 MG tablet Commonly known as:  ALDACTONE TAKE 1/2 TABLET BY MOUTH DAILY   terconazole 0.4 % vaginal cream Commonly known as:  TERAZOL 7 USE FOR 7 NIGHTS AS DIRECTED          Objective:   Physical Exam BP 126/78 (BP Location: Left Arm, Patient Position: Sitting, Cuff Size: Small)   Pulse 81   Temp 98.1 F (36.7 C) (Oral)   Resp 14   Ht 5\' 6"  (1.676 m)   Wt 138 lb 8 oz (62.8 kg)   SpO2 96%   BMI 22.35 kg/m    General:   Well developed, well nourished . NAD.  Neck: No  thyromegaly  HEENT:  Normocephalic . Face symmetric, atraumatic TMs normal. Slightly tender at the left TMJ Lungs:  CTA B Normal respiratory effort, no intercostal retractions, no accessory muscle  use. Heart: RRR,  no murmur.  No pretibial edema bilaterally  Abdomen:  Not distended, soft, non-tender. No rebound or rigidity.  Palpable, nontender aorta, upper abdomen, no bruit. Skin: Exposed areas without rash. Not pale. Not jaundice Neurologic:  alert & oriented X3.  Speech normal, gait appropriate for age and unassisted Strength symmetric and appropriate for age.  Psych: Cognition and judgment appear intact.  Cooperative with normal attention span and concentration.  Behavior appropriate. No anxious or depressed appearing.    Assessment & Plan:    Assessment  Hyperglycemia: A1C 6.0  (05-2016) HTN Hyperlipidemia Osteopenia : 02-2010: T score -2.4;  Tscore -1.1 2015: Tscore -2.0 Allergic rhinitis Shingles ~2010 Dry eye syndrome Palpable abdominal aorta: US abdomen  12-2015: no AAA  PLAN: Hyperglycemia: A1c 6.0 October 2017, she change her diet, losing weight. Recheck a A1c HTN: Well-controlled on amlodipine, chlorthalidone, losartan, Lopressor, potassium and Aldactone. Checking labs Hyperlipidemia: Has taking Crestor on and off and eventually stop it 2 weeks ago, she believes it was triggering vaginal yeast infections. We agreed not to check a cholesterol panel today, recheck in 4 months. She is willing to take pravastatin if necessary. Abdominal pain: Still there but overall better after she was treated for a yeast infection. GI note reviewed, recommend to call them if she still hurts, she may like to pursue a CT abdomen. Otalgia: Left side, exam normal, likely TMJ, recommend to discuss with dentist. Osteopenia: declined dexa, will call when ready RTC 4 months  Today, in addition to the physical exam I spent more than 15 min  explaining her that Crestor is not likely to cause a yeast infection, also reviewing the note from GI, recommending her to talk to them again and pursue a CT if she continue with symptoms, discussing the need to do a bone density,

## 2017-01-04 NOTE — Telephone Encounter (Signed)
Rx's refilled. 

## 2017-01-05 LAB — CBC WITH DIFFERENTIAL/PLATELET
BASOS ABS: 0 10*3/uL (ref 0.0–0.1)
BASOS PCT: 0.9 % (ref 0.0–3.0)
EOS PCT: 2.1 % (ref 0.0–5.0)
Eosinophils Absolute: 0.1 10*3/uL (ref 0.0–0.7)
HCT: 40.1 % (ref 36.0–46.0)
Hemoglobin: 13.3 g/dL (ref 12.0–15.0)
LYMPHS ABS: 1 10*3/uL (ref 0.7–4.0)
Lymphocytes Relative: 28.3 % (ref 12.0–46.0)
MCHC: 33.2 g/dL (ref 30.0–36.0)
MCV: 87.8 fl (ref 78.0–100.0)
MONOS PCT: 7 % (ref 3.0–12.0)
Monocytes Absolute: 0.2 10*3/uL (ref 0.1–1.0)
NEUTROS ABS: 2.1 10*3/uL (ref 1.4–7.7)
NEUTROS PCT: 61.7 % (ref 43.0–77.0)
PLATELETS: 256 10*3/uL (ref 150.0–400.0)
RBC: 4.57 Mil/uL (ref 3.87–5.11)
RDW: 13.4 % (ref 11.5–15.5)
WBC: 3.5 10*3/uL — ABNORMAL LOW (ref 4.0–10.5)

## 2017-01-05 LAB — COMPREHENSIVE METABOLIC PANEL
ALT: 18 U/L (ref 0–35)
AST: 23 U/L (ref 0–37)
Albumin: 4.8 g/dL (ref 3.5–5.2)
Alkaline Phosphatase: 51 U/L (ref 39–117)
BILIRUBIN TOTAL: 0.8 mg/dL (ref 0.2–1.2)
BUN: 16 mg/dL (ref 6–23)
CALCIUM: 10.1 mg/dL (ref 8.4–10.5)
CHLORIDE: 104 meq/L (ref 96–112)
CO2: 26 meq/L (ref 19–32)
CREATININE: 0.76 mg/dL (ref 0.40–1.20)
GFR: 79.76 mL/min (ref >60.00)
GLUCOSE: 90 mg/dL (ref 70–99)
Potassium: 3.9 mEq/L (ref 3.5–5.1)
SODIUM: 139 meq/L (ref 135–145)
Total Protein: 7.6 g/dL (ref 6.0–8.3)

## 2017-01-05 LAB — HEMOGLOBIN A1C: Hgb A1c MFr Bld: 6 % (ref 4.6–6.5)

## 2017-01-13 ENCOUNTER — Telehealth: Payer: Self-pay | Admitting: Internal Medicine

## 2017-01-13 NOTE — Telephone Encounter (Signed)
Spoke w/ Pt, informed her of recommendations. Pt verbalized understanding.

## 2017-01-13 NOTE — Telephone Encounter (Signed)
Caller name: Brittnae Relationship to patient: self Can be reached: [phone]   Reason for call: pt stated that she has not been taking rosuvastatin as it was causing issues. Does she need to take any cholesterol medication anymore? Has been taking OTC garlic

## 2017-01-13 NOTE — Telephone Encounter (Signed)
We agreed not to check her cholesterol this time, to be check in 4 months. She is currently on no statin or cholesterol medication

## 2017-01-13 NOTE — Telephone Encounter (Signed)
Pt seen 01/04/2017 for CPX, lipid panel not completed. Please advise.

## 2017-03-08 ENCOUNTER — Telehealth: Payer: Self-pay | Admitting: Cardiovascular Disease

## 2017-03-08 ENCOUNTER — Other Ambulatory Visit: Payer: Self-pay | Admitting: *Deleted

## 2017-03-08 DIAGNOSIS — I10 Essential (primary) hypertension: Secondary | ICD-10-CM

## 2017-03-08 MED ORDER — POTASSIUM CHLORIDE CRYS ER 20 MEQ PO TBCR: 20.0000 meq | EXTENDED_RELEASE_TABLET | Freq: Every day | ORAL | 1 refills | Status: DC

## 2017-03-08 MED ORDER — POTASSIUM CHLORIDE CRYS ER 20 MEQ PO TBCR: 20.0000 meq | EXTENDED_RELEASE_TABLET | Freq: Every day | ORAL | 3 refills | Status: DC

## 2017-03-08 NOTE — Telephone Encounter (Signed)
°*  STAT* If patient is at the pharmacy, call can be transferred to refill team.   1. Which medications need to be refilled? (please list name of each medication and dose if known) Potassium  Chloride 20 MEG  2. Which pharmacy/location (including street and city if local pharmacy) is medication to be sent to? [facility] [phone]- [place]  [address] at [facility] [place]  3. Do they need a 30 day or 90 day supply? 90  Patient has appt with Oval Linsey on 06-29-17 and is out of medication// former pt of [name]

## 2017-03-08 NOTE — Telephone Encounter (Signed)
New Message   pt verbalized that she is calling for rn   She wants a refill and she said that she needs   an appt but recall says in November please call pt

## 2017-03-08 NOTE — Telephone Encounter (Signed)
Klorcon 20 meq refill request received by fax - this has already been prescribed electronically.

## 2017-03-08 NOTE — Addendum Note (Signed)
Addended by: Alvina Filbert B on: 03/08/2017 06:13 PM   Modules accepted: Orders

## 2017-05-10 ENCOUNTER — Ambulatory Visit: Payer: Medicare Other | Admitting: Internal Medicine

## 2017-05-15 IMAGING — MG MM SCREENING BREAST TOMO BILATERAL
8 of 13 series · 8 of 29 positions shown · non-contrast
Comparison: Previous exam(s).

CLINICAL DATA: Screening.

EXAM:
2D DIGITAL SCREENING BILATERAL MAMMOGRAM WITH CAD AND ADJUNCT TOMO

[L MLO (1 of 2)]
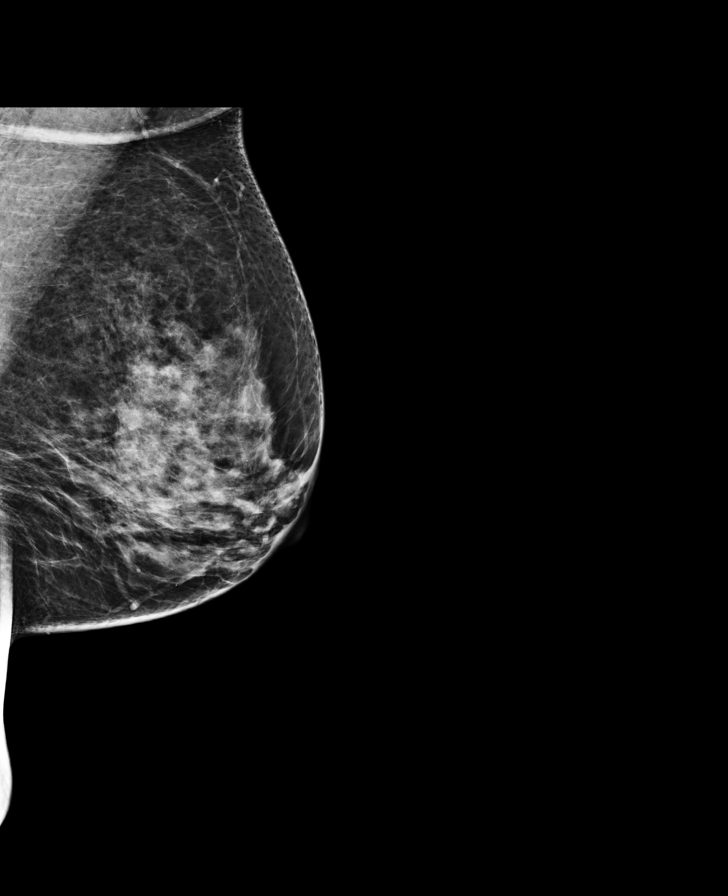

[L MLO (2 of 2)]
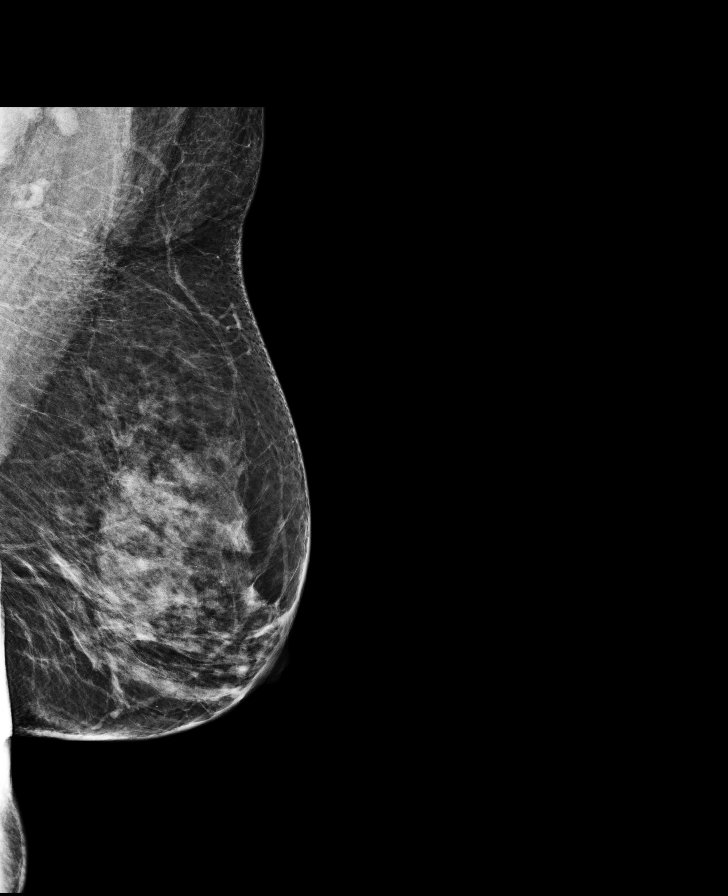

[L CC synth-2D]
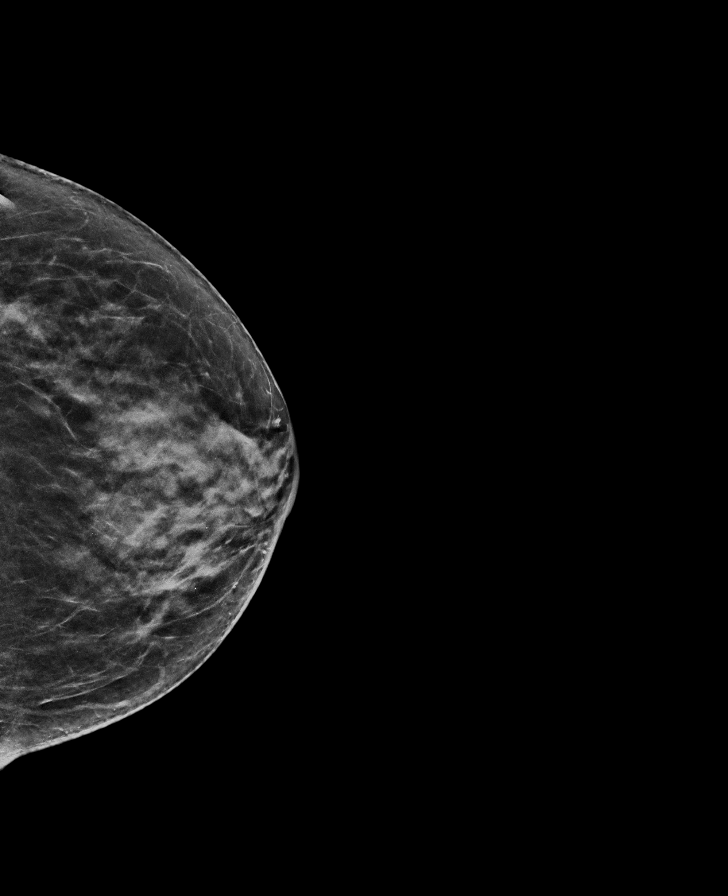

[L MLO synth-2D]
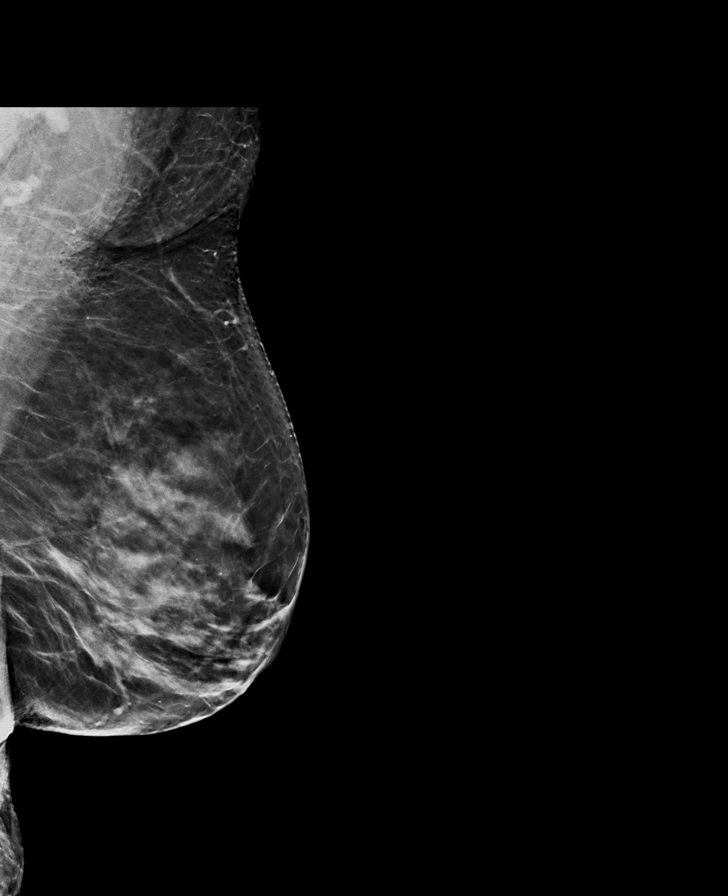

[R CC]
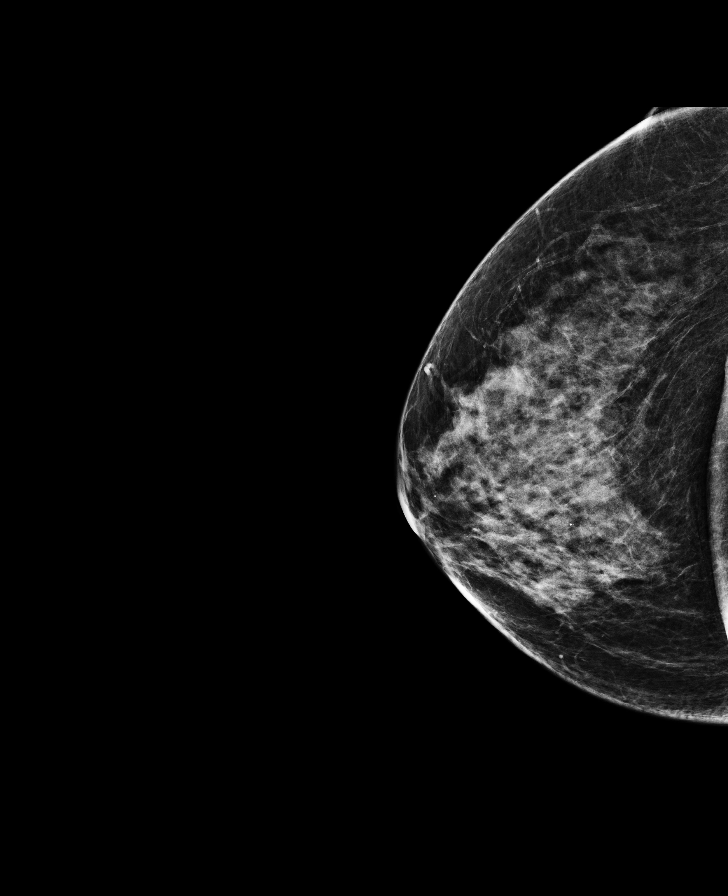

[L CC]
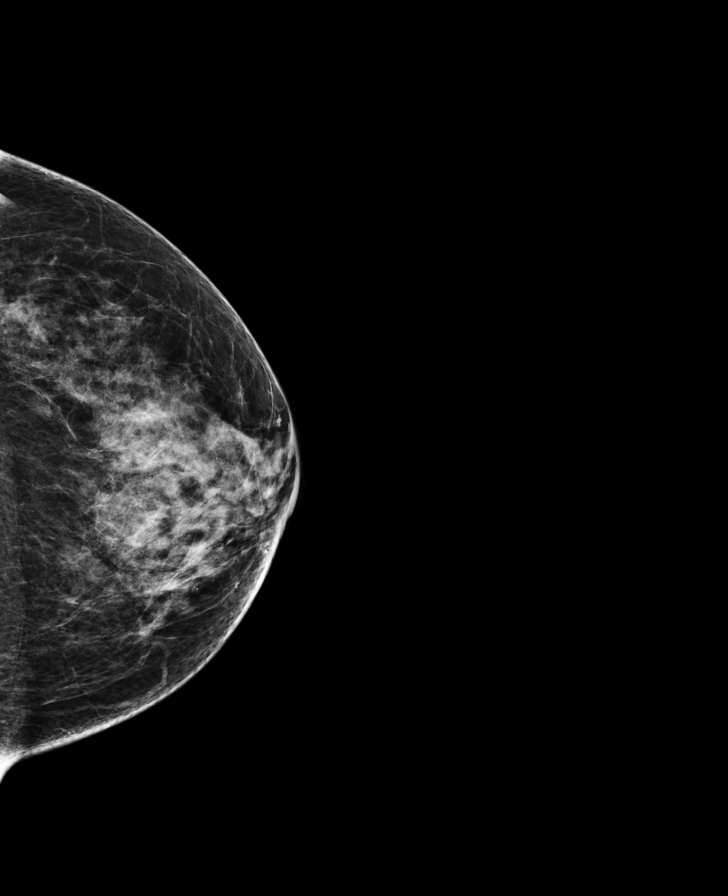

[R MLO synth-2D]
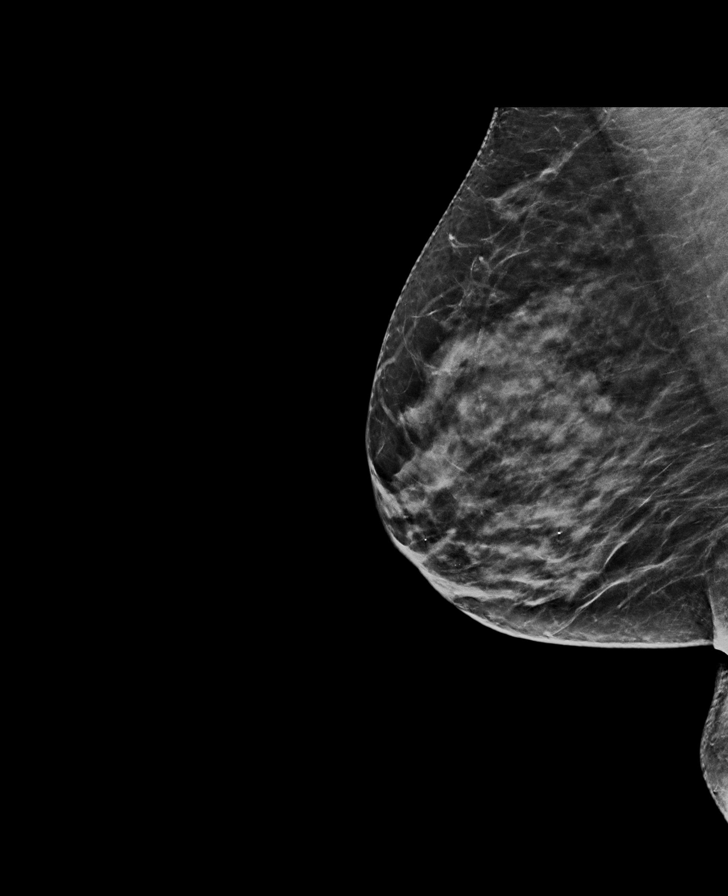

[R MLO]
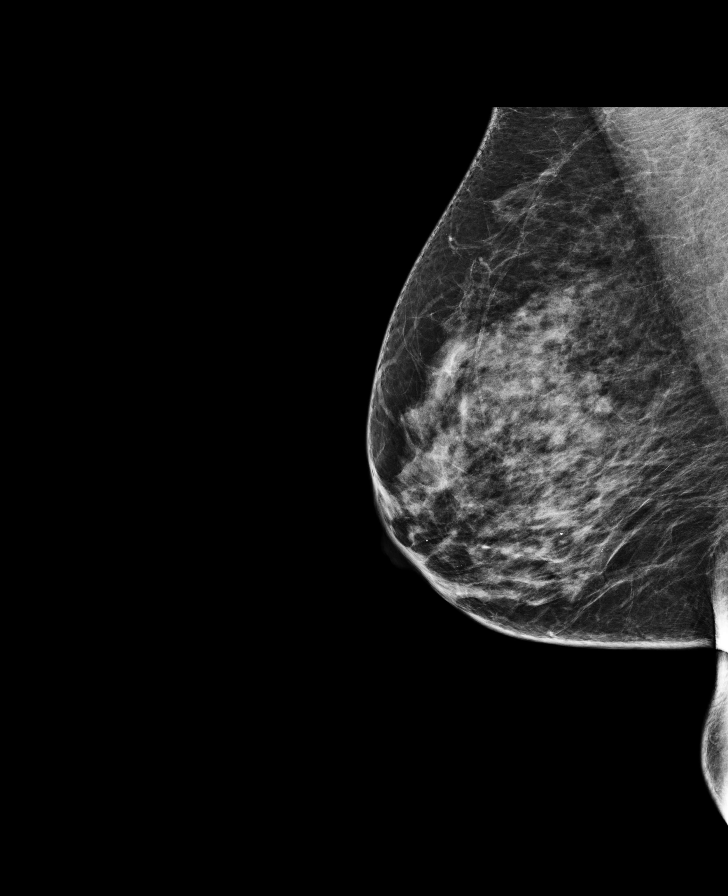

[8 of 29 positions shown; findings below may reference images not displayed]

ACR Breast Density Category c: The breast tissue is heterogeneously
dense, which may obscure small masses.
FINDINGS: There are no findings suspicious for malignancy. Images were
processed with CAD.
IMPRESSION: No mammographic evidence of malignancy. A result letter of this
screening mammogram will be mailed directly to the patient.

RECOMMENDATION:
Screening mammogram in one year. (Code:[id number])

BI-RADS CATEGORY  1: Negative.

## 2017-05-24 ENCOUNTER — Ambulatory Visit (INDEPENDENT_AMBULATORY_CARE_PROVIDER_SITE_OTHER): Payer: Medicare Other | Admitting: Internal Medicine

## 2017-05-24 ENCOUNTER — Encounter: Payer: Self-pay | Admitting: Internal Medicine

## 2017-05-24 VITALS — BP 122/60 | HR 61 | Temp 98.2°F | Resp 14 | Ht 66.0 in | Wt 141.1 lb

## 2017-05-24 DIAGNOSIS — Z23 Encounter for immunization: Secondary | ICD-10-CM | POA: Diagnosis not present

## 2017-05-24 DIAGNOSIS — R739 Hyperglycemia, unspecified: Secondary | ICD-10-CM

## 2017-05-24 DIAGNOSIS — E785 Hyperlipidemia, unspecified: Secondary | ICD-10-CM

## 2017-05-24 DIAGNOSIS — I10 Essential (primary) hypertension: Secondary | ICD-10-CM | POA: Diagnosis not present

## 2017-05-24 LAB — BASIC METABOLIC PANEL
BUN: 14 mg/dL (ref 6–23)
CHLORIDE: 100 meq/L (ref 96–112)
CO2: 30 meq/L (ref 19–32)
CREATININE: 0.74 mg/dL (ref 0.40–1.20)
Calcium: 10.3 mg/dL (ref 8.4–10.5)
GFR: 82.17 mL/min (ref >60.00)
Glucose, Bld: 100 mg/dL — ABNORMAL HIGH (ref 70–99)
Potassium: 4.1 mEq/L (ref 3.5–5.1)
SODIUM: 137 meq/L (ref 135–145)

## 2017-05-24 LAB — LIPID PANEL
CHOL/HDL RATIO: 4
Cholesterol: 258 mg/dL — ABNORMAL HIGH (ref 0–200)
HDL: 60.6 mg/dL (ref >39.00)
LDL CALC: 184 mg/dL — AB (ref 0–99)
NonHDL: 197.1
TRIGLYCERIDES: 67 mg/dL (ref 0.0–149.0)
VLDL: 13.4 mg/dL (ref 0.0–40.0)

## 2017-05-24 LAB — HEMOGLOBIN A1C: Hgb A1c MFr Bld: 6 % (ref 4.6–6.5)

## 2017-05-24 NOTE — Patient Instructions (Signed)
GO TO THE LAB : Get the blood work     GO TO THE FRONT DESK Schedule your next appointment for a physical exam by May 2019

## 2017-05-24 NOTE — Progress Notes (Signed)
Pre visit review using our clinic review tool, if applicable. No additional management support is needed unless otherwise documented below in the visit note. 

## 2017-05-24 NOTE — Progress Notes (Signed)
Subjective:    Patient ID: Katherine Koch, female    DOB: 1946/07/08, 71 y.o.   MRN: [id number]  DOS:  05/24/2017 Type of visit - description : rov Interval history: No major concerns. Good compliance with BP meds, normal ambulatory BPs High cholesterol: On diet only due for labs.   Review of Systems Occasional pain at both hips after doing the elliptical, usually less often sxs  when she exercises regularly Denies chest pain or difficulty breathing. No abdominal pain No vaginal yeast infections  Past Medical History:  Diagnosis Date  . Allergic rhinitis   . Dry eye syndrome   . Hyperlipemia   . Hypertension   . Osteopenia    per pt had a DEXA few years ago  . Shingles 3/10    Past Surgical History:  Procedure Laterality Date  . ABDOMINAL HYSTERECTOMY     B oophorectomy- remotely  . BREAST LUMPECTOMY Right    fibrocystic    Social History   Social History  . Marital status: Single    Spouse name: N/A  . Number of children: 1  . Years of education: N/A   Occupational History  . retired  Retired    Lawyer   Social History Main Topics  . Smoking status: Former Smoker    Types: Cigarettes  . Smokeless tobacco: Never Used     Comment: quit 2011, 1/2 ppd , age of onset? smoked off-on  . Alcohol use No  . Drug use: No  . Sexual activity: Not on file   Other Topics Concern  . Not on file   Social History Narrative   Divorced, retired Air cabin crew    Daughter in [place] to [place] from [place] 11/09                   Allergies as of 05/24/2017   No Known Allergies     Medication List       Accurate as of 05/24/17 11:59 PM. Always use your most recent med list.          amLODipine 10 MG tablet Commonly known as:  NORVASC Take 1 tablet (10 mg total) by mouth daily.   aspirin 81 MG tablet Take 81 mg by mouth daily.   CALTRATE 600+D PO Take by mouth as directed.   chlorthalidone 25 MG tablet Commonly known as:  HYGROTON TAKE  1 TABLET BY MOUTH DAILY   fluticasone 50 MCG/ACT nasal spray Commonly known as:  FLONASE Place 2 sprays into both nostrils daily.   losartan 100 MG tablet Commonly known as:  COZAAR Take 1 tablet (100 mg total) by mouth daily.   metoprolol tartrate 25 MG tablet Commonly known as:  LOPRESSOR 1/2 tablet (12.5mg ) two times a day   MULTIVITAMINS Chew Chew 1 tablet by mouth daily.   potassium chloride SA 20 MEQ tablet Commonly known as:  K-DUR,KLOR-CON Take 1 tablet (20 mEq total) by mouth daily.   spironolactone 25 MG tablet Commonly known as:  ALDACTONE TAKE 1/2 TABLET BY MOUTH DAILY          Objective:   Physical Exam BP 122/60 (BP Location: Left Arm, Patient Position: Sitting, Cuff Size: Small)   Pulse 61   Temp 98.2 F (36.8 C) (Oral)   Resp 14   Ht 5\' 6"  (1.676 m)   Wt 141 lb 2 oz (64 kg)   SpO2 96%   BMI 22.78 kg/m  General:   Well developed, well nourished .  NAD.  HEENT:  Normocephalic . Face symmetric, atraumatic Lungs:  CTA B Normal respiratory effort, no intercostal retractions, no accessory muscle use. Heart: RRR,  no murmur.  No pretibial edema bilaterally  Skin: Not pale. Not jaundice Neurologic:  alert & oriented X3.  Speech normal, gait appropriate for age and unassisted Psych--  Cognition and judgment appear intact.  Cooperative with normal attention span and concentration.  Behavior appropriate. No anxious or depressed appearing.      Assessment & Plan:   Assessment  Hyperglycemia: A1C 6.0  (05-2016) HTN Hyperlipidemia Osteopenia : 02-2010: T score -2.4;  Tscore -1.1 2015: Tscore -2.0 Allergic rhinitis Shingles ~2010 Dry eye syndrome Palpable abdominal aorta: US abdomen  12-2015: no AAA  PLAN: Hyperglycemia: Check A1c HTN: Well controlled on chlorthalidone, losartan, Lopressor, potassium, Aldactone, amlodipine. Check a BMP Hyperlipidemia: On diet only, she is willing to try statins again if needed. Check a FLP Preventive care:  Flu shot today RTC 12/2017 CPX

## 2017-05-25 NOTE — Assessment & Plan Note (Signed)
Hyperglycemia: Check A1c HTN: Well controlled on chlorthalidone, losartan, Lopressor, potassium, Aldactone, amlodipine. Check a BMP Hyperlipidemia: On diet only, she is willing to try statins again if needed. Check a FLP Preventive care: Flu shot today RTC 12/2017 CPX

## 2017-05-26 MED ORDER — PRAVASTATIN SODIUM 40 MG PO TABS: 40.0000 mg | ORAL_TABLET | Freq: Every day | ORAL | 3 refills | Status: DC

## 2017-05-26 NOTE — Addendum Note (Signed)
Addended byDamita Dunnings D on: 05/26/2017 01:56 PM   Modules accepted: Orders

## 2017-06-29 ENCOUNTER — Encounter: Payer: Self-pay | Admitting: Cardiovascular Disease

## 2017-06-29 ENCOUNTER — Ambulatory Visit: Payer: Medicare Other | Admitting: Cardiovascular Disease

## 2017-06-29 VITALS — BP 124/72 | HR 65 | Ht 67.0 in | Wt 140.7 lb

## 2017-06-29 DIAGNOSIS — I517 Cardiomegaly: Secondary | ICD-10-CM

## 2017-06-29 DIAGNOSIS — E785 Hyperlipidemia, unspecified: Secondary | ICD-10-CM | POA: Diagnosis not present

## 2017-06-29 DIAGNOSIS — I10 Essential (primary) hypertension: Secondary | ICD-10-CM | POA: Diagnosis not present

## 2017-06-29 NOTE — Patient Instructions (Signed)
Medication Instructions:  Your physician recommends that you continue on your current medications as directed. Please refer to the Current Medication list given to you today.  Labwork: NONE  Testing/Procedures: NONE  Follow-Up: Your physician recommends that you schedule a follow-up appointment in: 3 MONTH OV  If you need a refill on your cardiac medications before your next appointment, please call your pharmacy.  

## 2017-06-29 NOTE — Progress Notes (Signed)
Cardiology Office Note   Date:  06/30/2017   ID:  Irva Loser, DOB 1945-11-18, MRN [id number]  PCP:  Colon Branch, MD  Cardiologist:   Skeet Latch, MD   Chief Complaint  Patient presents with  . Follow-up     History of Present Illness: Katherine Koch is a 71 y.o. female with resistent hypertension, hyperlipidemia, and severe LVH who presents for follow up.  Ms. Basinski was previously a patient of [name].  She was last seen 06/2016.  She had an echo 03/2014 that revealed LVEF 60-65% with grade 2 diastolic dysfunction.  She had severe left ventricular hypertrophy which was thought to be due to poorly controlled hypertension.  Since her last appointment Ms. Battaglia has been feeling well.  She has been stressed about looking for a new house and moving.  She is currently in the process of moving to a new home and found out that there are ants in the kitchen.  She has a very tidy person so this has been very stressful.  She denies any chest pain or shortness of breath.  She has not been getting much exercise lately due to this move.  Despite this she has lost 10 pounds in the last year by changing her diet.  She denies lower extremity edema, orthopnea, or PND.  She has been taking metoprolol once daily instead of twice because she thinks that the dose is too strong for her.  Her heart rate gets down into the 60s at times.  She denies any lightheadedness or dizziness other than occasional mild episodes when bending over.  Her PCP recommended that she restart a stati but was  hesitant to start atorvastatin because she thinks this caused her mother to develop lymphoma.  She also is resistant to rosuvastatin as she is thinks this caused her to have yeast infections.  She did consent to restarting pravastatin, though this has not been effective at getting her to goal in the past.   Past Medical History:  Diagnosis Date  . Allergic rhinitis   . Dry eye syndrome   . Hyperlipemia   .  Hypertension   . Osteopenia    per pt had a DEXA few years ago  . Shingles 3/10    Past Surgical History:  Procedure Laterality Date  . ABDOMINAL HYSTERECTOMY     B oophorectomy- remotely  . BREAST LUMPECTOMY Right    fibrocystic     Current Outpatient Medications  Medication Sig Dispense Refill  . amLODipine (NORVASC) 10 MG tablet Take 1 tablet (10 mg total) by mouth daily. 90 tablet 2  . aspirin 81 MG tablet Take 81 mg by mouth daily.      . Calcium Carbonate-Vitamin D (CALTRATE 600+D PO) Take by mouth as directed.     . chlorthalidone (HYGROTON) 25 MG tablet TAKE 1 TABLET BY MOUTH DAILY 30 tablet 5  . fluticasone (FLONASE) 50 MCG/ACT nasal spray Place 2 sprays daily as needed into both nostrils for allergies or rhinitis.    Marland Kitchen losartan (COZAAR) 100 MG tablet Take 1 tablet (100 mg total) by mouth daily. 90 tablet 2  . metoprolol tartrate (LOPRESSOR) 25 MG tablet Take 25 mg as directed by mouth. 1/2 tablet by mouth daily    . Multiple Vitamins-Minerals (MULTIVITAMINS) CHEW Chew 1 tablet by mouth daily.     . potassium chloride SA (K-DUR,KLOR-CON) 20 MEQ tablet Take 1 tablet (20 mEq total) by mouth daily. 90 tablet 1  . pravastatin (PRAVACHOL)  40 MG tablet Take 1 tablet (40 mg total) by mouth at bedtime. 30 tablet 3  . spironolactone (ALDACTONE) 25 MG tablet TAKE 1/2 TABLET BY MOUTH DAILY 15 tablet 5   No current facility-administered medications for this visit.     Allergies:   Patient has no known allergies.    Social History:  The patient  reports that she has quit smoking. Her smoking use included cigarettes. she has never used smokeless tobacco. She reports that she does not drink alcohol or use drugs.   Family History:  The patient's family history includes Coronary artery disease in her mother; Hypertension in her brother and mother; Non-Hodgkin's lymphoma in her mother.    ROS:  Please see the history of present illness.   Otherwise, review of systems are positive for  none.   All other systems are reviewed and negative.    PHYSICAL EXAM: VS:  BP 124/72   Pulse 65   Ht 5\' 7"  (1.702 m)   Wt 63.8 kg (140 lb 11.2 oz)   BMI 22.04 kg/m  , BMI Body mass index is 22.04 kg/m. GENERAL:  Well appearing HEENT:  Pupils equal round and reactive, fundi not visualized, oral mucosa unremarkable NECK:  No jugular venous distention, waveform within normal limits, carotid upstroke brisk and symmetric, no bruits, no thyromegaly LYMPHATICS:  No cervical adenopathy LUNGS:  Clear to auscultation bilaterally HEART:  RRR.  PMI not displaced or sustained,S1 and S2 within normal limits, no S3, no S4, no clicks, no rubs, no murmurs ABD:  Flat, positive bowel sounds normal in frequency in pitch, no bruits, no rebound, no guarding, no midline pulsatile mass, no hepatomegaly, no splenomegaly EXT:  2 plus pulses throughout, no edema, no cyanosis no clubbing SKIN:  No rashes no nodules NEURO:  Cranial nerves II through XII grossly intact, motor grossly intact throughout PSYCH:  Cognitively intact, oriented to person place and time   EKG:  EKG is ordered today. The ekg ordered today demonstrates sinus rhythm.  Rate 65 bpm. LVH with secondary repolarization abnormalities.    Echo 03/28/14: Study Conclusions  - Left ventricle: The cavity size was normal. Wall thickness was increased in a pattern of severe LVH. Systolic function was normal. The estimated ejection fraction was in the range of 60% to 65%. Features are consistent with a pseudonormal left ventricular filling pattern, with concomitant abnormal relaxation and increased filling pressure (grade 2 diastolic dysfunction). - Mitral valve: Calcified annulus. Mildly thickened leaflets . There was mild regurgitation. - Left atrium: The atrium was mildly dilated.  Recent Labs: 01/04/2017: ALT 18; Hemoglobin 13.3; Platelets 256.0 05/24/2017: BUN 14; Creatinine, Ser 0.74; Potassium 4.1; Sodium 137    Lipid Panel     Component Value Date/Time   CHOL 258 (H) 05/24/2017 1129   TRIG 67.0 05/24/2017 1129   HDL 60.60 05/24/2017 1129   CHOLHDL 4 05/24/2017 1129   VLDL 13.4 05/24/2017 1129   LDLCALC 184 (H) 05/24/2017 1129   LDLDIRECT 155.8 02/15/2012 1354      Wt Readings from Last 3 Encounters:  06/29/17 63.8 kg (140 lb 11.2 oz)  05/24/17 64 kg (141 lb 2 oz)  01/04/17 62.8 kg (138 lb 8 oz)      ASSESSMENT AND PLAN:  # Hyperlipidemia: LDL was 184 05/2017.  She restarted pravastatin and will have lipids checked in January with [name].  She wants to return in 3 months to discuss.    # Hypertension:  Blood pressure well-controlled on amlodipine, chlorthalidone,  losartan, metoprolol, and spironolactone.  Renal function and electrolytes were stable 05/2017.  # Severe LVH: She is euvolemic and this is thought to be due to hypertensive heart disease.  Continue hypertension management as above.  No changes.   Current medicines are reviewed at length with the patient today.  The patient does not have concerns regarding medicines.  The following changes have been made:  no change  Labs/ tests ordered today include:  No orders of the defined types were placed in this encounter.    Disposition:   FU with Hobert Poplaski C. Oval Linsey, MD, [facility] in 3 months.      This note was written with the assistance of speech recognition software.  Please excuse any transcriptional errors.  Signed, Wavie Hashimi C. Oval Linsey, MD, [facility]  06/30/2017 8:36 AM    [place]

## 2017-06-30 ENCOUNTER — Encounter: Payer: Self-pay | Admitting: Cardiovascular Disease

## 2017-07-15 ENCOUNTER — Telehealth: Payer: Self-pay | Admitting: Internal Medicine

## 2017-07-19 ENCOUNTER — Other Ambulatory Visit: Payer: Self-pay | Admitting: Cardiovascular Disease

## 2017-07-20 ENCOUNTER — Other Ambulatory Visit: Payer: Medicare Other

## 2017-07-22 ENCOUNTER — Telehealth: Payer: Self-pay | Admitting: Cardiovascular Disease

## 2017-07-22 MED ORDER — SPIRONOLACTONE 25 MG PO TABS: 12.5000 mg | ORAL_TABLET | Freq: Every day | ORAL | 2 refills | Status: DC

## 2017-07-22 NOTE — Telephone Encounter (Signed)
New Message        *STAT* If patient is at the pharmacy, call can be transferred to refill team.   1. Which medications need to be refilled? (please list name of each medication and dose if known)  spironolactone (ALDACTONE) 25 MG tablet TAKE 1/2 TABLET BY MOUTH DAILY     2. Which pharmacy/location (including street and city if local pharmacy) is medication to be sent to?[facility] on [address]  3. Do they need a 30 day or 90 day supply? [place]

## 2017-08-01 ENCOUNTER — Telehealth: Payer: Self-pay

## 2017-08-01 NOTE — Telephone Encounter (Signed)
-----   Message from Colon Branch, MD sent at 07/30/2017  1:04 PM EST ----- Regarding: Due for labs Please contact the patient, due for a FLP, AST, ALT.  If unable to communicate send a letter

## 2017-08-01 NOTE — Telephone Encounter (Signed)
Letter printed and mailed to Pt instructing her to call office to schedule appt.

## 2017-09-06 ENCOUNTER — Other Ambulatory Visit (INDEPENDENT_AMBULATORY_CARE_PROVIDER_SITE_OTHER): Payer: Medicare Other

## 2017-09-06 DIAGNOSIS — E785 Hyperlipidemia, unspecified: Secondary | ICD-10-CM

## 2017-09-06 LAB — LIPID PANEL
CHOLESTEROL: 203 mg/dL — AB (ref 0–200)
HDL: 59.6 mg/dL (ref >39.00)
LDL CALC: 125 mg/dL — AB (ref 0–99)
NonHDL: 143.68
Total CHOL/HDL Ratio: 3
Triglycerides: 91 mg/dL (ref 0.0–149.0)
VLDL: 18.2 mg/dL (ref 0.0–40.0)

## 2017-09-06 LAB — ALT: ALT: 18 U/L (ref 0–35)

## 2017-09-06 LAB — AST: AST: 21 U/L (ref 0–37)

## 2017-10-11 IMAGING — US US ABDOMEN COMPLETE
1 series · 13 of 25 positions shown · non-contrast
Comparison: None.

CLINICAL DATA: Right abdominal pain for 2 weeks.

EXAM:
ABDOMEN ULTRASOUND COMPLETE

[Series 1: us abdomen complete · 0.19mm/px · 13 of 91 slices shown]
[im 1/91]
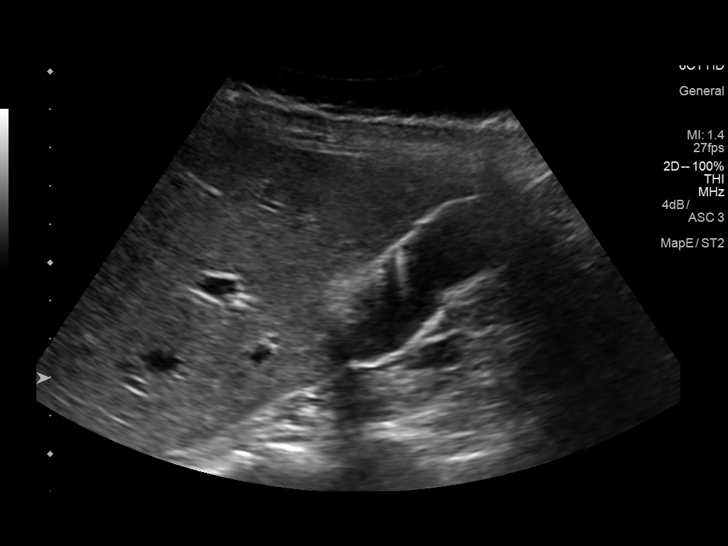
[im 8/91]
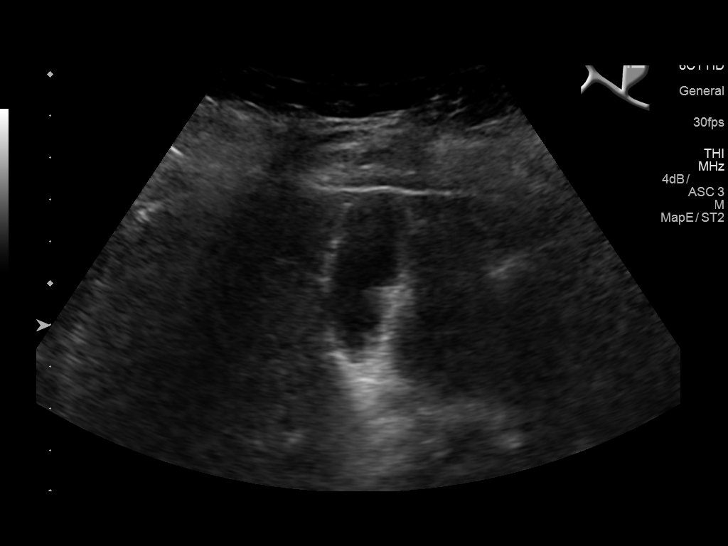
[im 16/91]
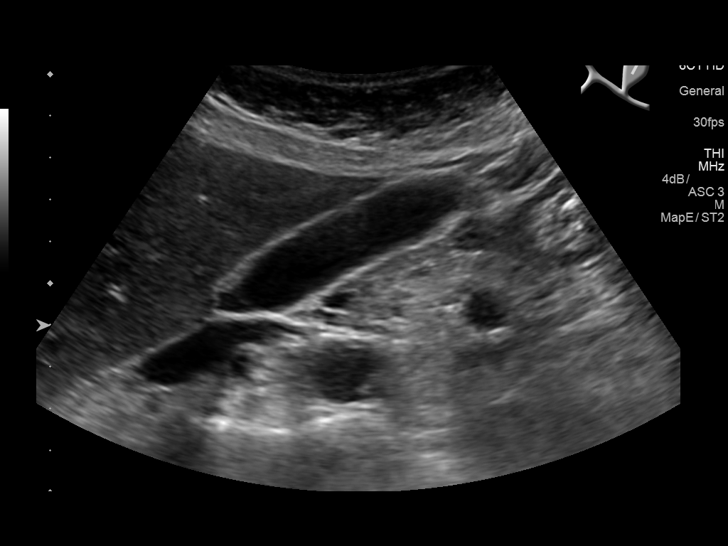
[im 23/91]
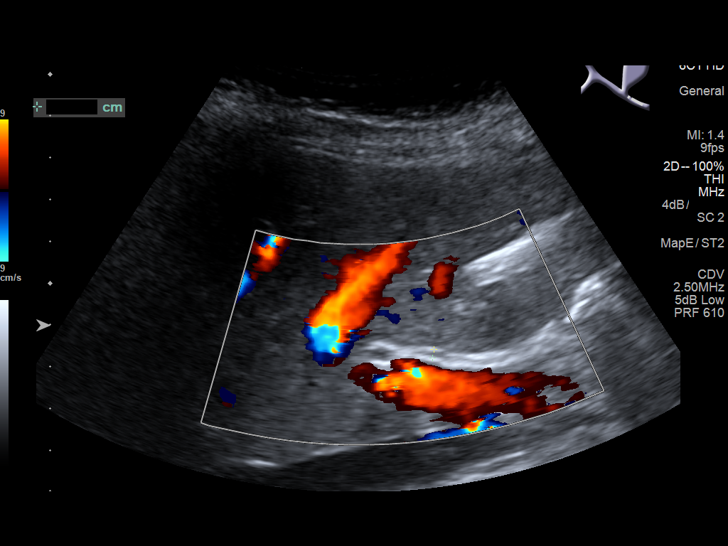
[im 31/91]
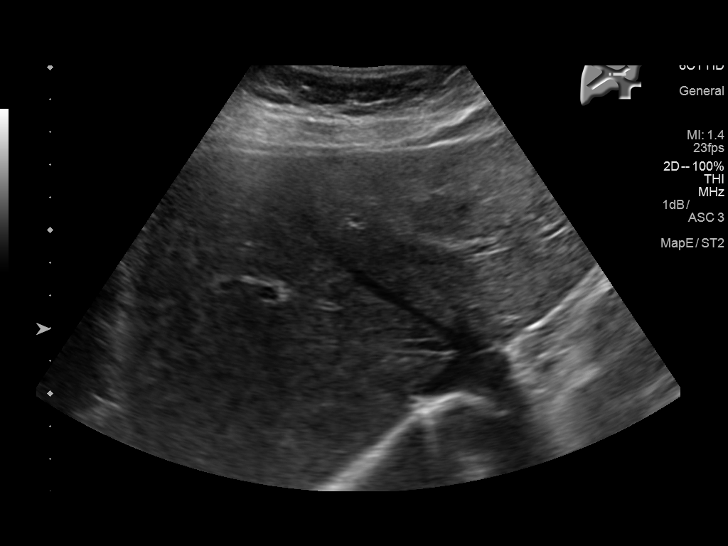
[im 38/91]
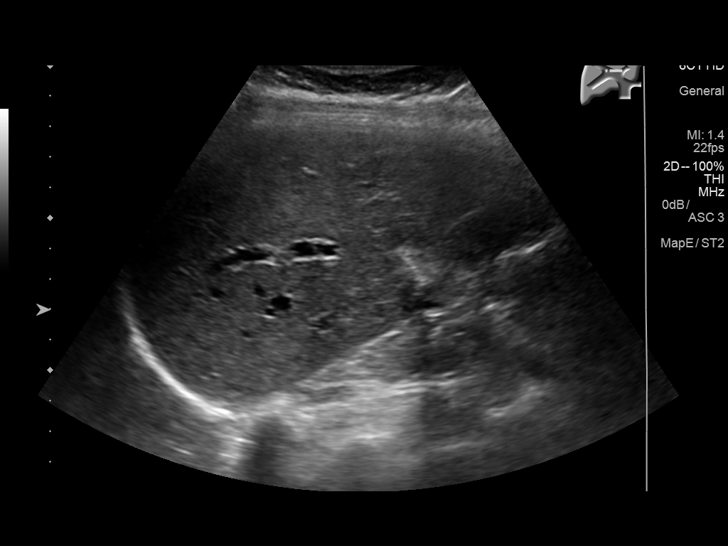
[im 46/91]
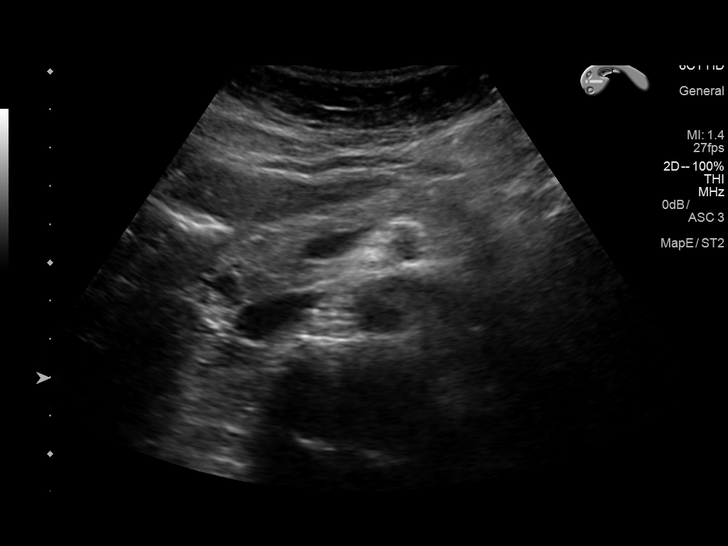
[im 53/91]
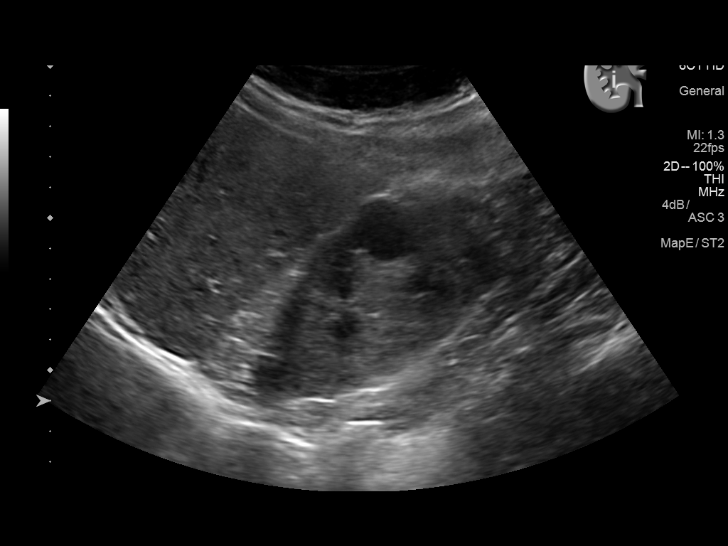
[im 61/91]
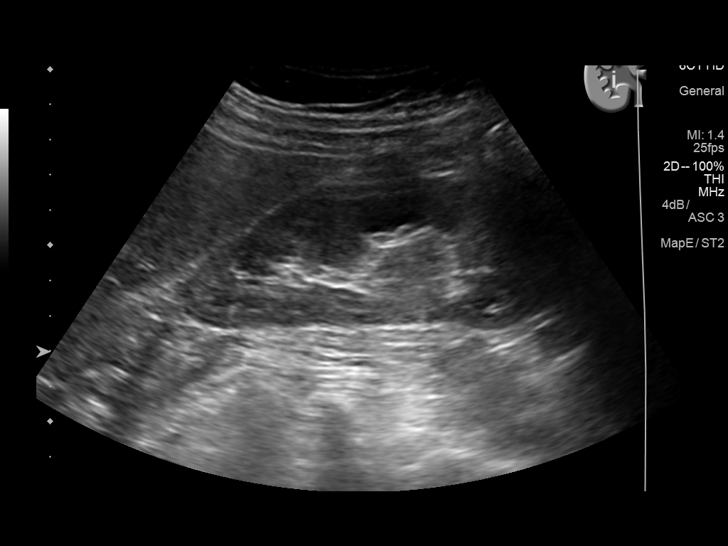
[im 68/91]
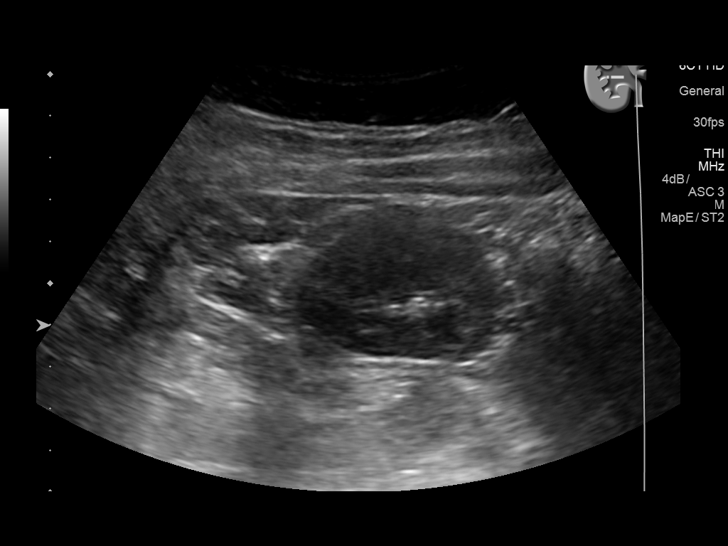
[im 76/91]
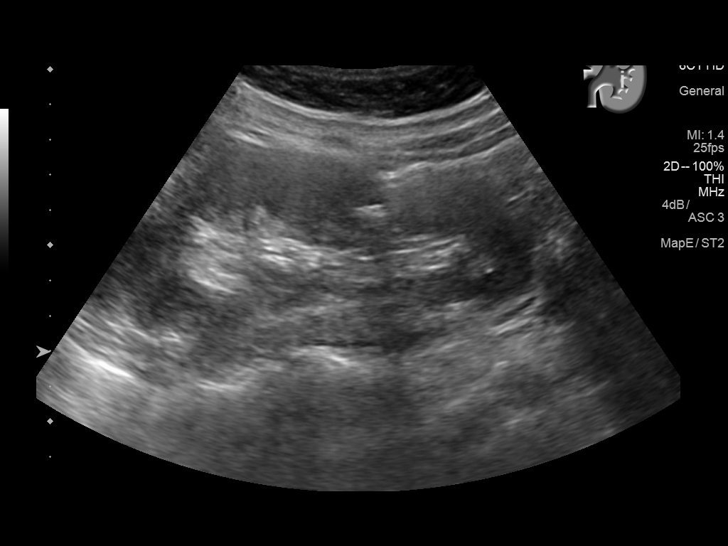
[im 83/91]
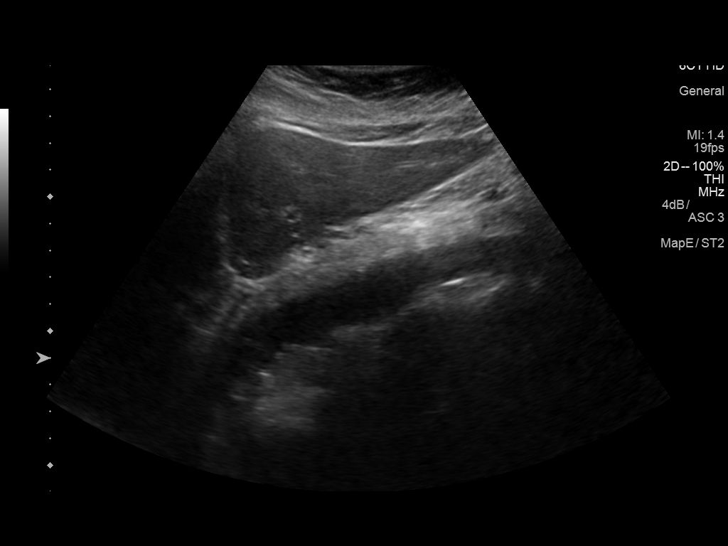
[im 91/91]
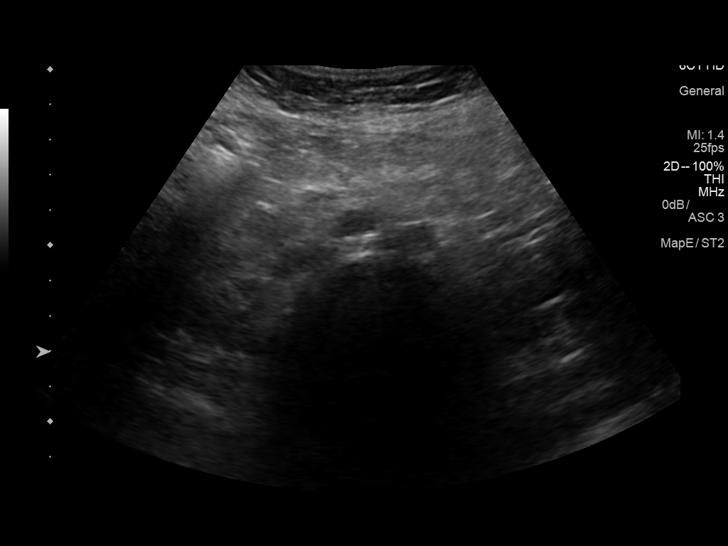

[13 of 25 positions shown; findings below may reference images not displayed]

FINDINGS: Gallbladder: Nondistended gallbladder. No cholelithiasis. No
gallbladder wall thickening. There are scattered foci of
reverberation artifact in the nondependent gallbladder, indicating
adenomyomatosis. No pericholecystic fluid or sonographic Murphy's
sign.

Common bile duct: Diameter: 2 mm

Liver: No focal lesion identified. Within normal limits in
parenchymal echogenicity.

IVC: No abnormality visualized.

Pancreas: Visualized portion unremarkable.

Spleen: Size and appearance within normal limits.

Right Kidney: Length: 11.9 cm. Echogenicity within normal limits. No
hydronephrosis. Simple 2.5 x 1.9 x 2.3 cm renal cyst in the
interpolar right kidney.

Left Kidney: Length: 11.6 cm. Echogenicity within normal limits. No
hydronephrosis. There is a shadowing 10 mm echogenic structure in
the interpolar left kidney, which could represent a nonobstructing
stone versus parenchymal cyst with layering milk of calcium.

Abdominal aorta: No aneurysm visualized.

Other findings: None.
IMPRESSION: 1. No cholelithiasis. Adenomyomatosis of the gallbladder. No biliary
ductal dilatation.
2. Shadowing echogenic 10 mm structure in the interpolar left
kidney, which could represent a nonobstructing stone versus renal
cyst with layering milk of calcium. No hydronephrosis.

## 2017-10-13 ENCOUNTER — Other Ambulatory Visit: Payer: Self-pay | Admitting: *Deleted

## 2017-10-13 ENCOUNTER — Ambulatory Visit: Payer: Medicare Other | Admitting: Cardiovascular Disease

## 2017-10-13 ENCOUNTER — Encounter: Payer: Self-pay | Admitting: Cardiovascular Disease

## 2017-10-13 VITALS — BP 142/80 | HR 64 | Ht 67.0 in | Wt 141.2 lb

## 2017-10-13 DIAGNOSIS — I517 Cardiomegaly: Secondary | ICD-10-CM | POA: Diagnosis not present

## 2017-10-13 DIAGNOSIS — I10 Essential (primary) hypertension: Secondary | ICD-10-CM

## 2017-10-13 DIAGNOSIS — E785 Hyperlipidemia, unspecified: Secondary | ICD-10-CM | POA: Diagnosis not present

## 2017-10-13 MED ORDER — SPIRONOLACTONE 25 MG PO TABS: 12.5000 mg | ORAL_TABLET | Freq: Every day | ORAL | 3 refills | Status: DC

## 2017-10-13 MED ORDER — POTASSIUM CHLORIDE CRYS ER 20 MEQ PO TBCR
20.0000 meq | EXTENDED_RELEASE_TABLET | Freq: Every day | ORAL | 3 refills | Status: DC
Start: 2017-10-13 — End: 2018-11-16

## 2017-10-13 MED ORDER — CHLORTHALIDONE 25 MG PO TABS: 25.0000 mg | ORAL_TABLET | Freq: Every day | ORAL | 3 refills | Status: DC

## 2017-10-13 NOTE — Patient Instructions (Signed)

## 2017-10-13 NOTE — Progress Notes (Signed)
Cardiology Office Note   Date:  10/13/2017   ID:  Katherine Koch, DOB February 26, 1946, MRN [id number]  PCP:  Colon Branch, MD  Cardiologist:   Skeet Latch, MD   No chief complaint on file.    History of Present Illness: Katherine Koch is a 71 y.o. female with resistent hypertension, hyperlipidemia, and severe LVH who presents for follow up.  Katherine Koch was previously a patient of [name].  She was last seen 06/2016.  She had an echo 03/2014 that revealed LVEF 60-65% with grade 2 diastolic dysfunction.  She had severe left ventricular hypertrophy which was thought to be due to poorly controlled hypertension.  Since her last appointment Katherine Koch has been feeling well.  She has been stressed about looking for a new house and moving.  She is currently in the process of moving to a new home and found out that there are ants in the kitchen.  She is a very tidy person so this has been very stressful.  She denies any chest pain or shortness of breath.  She has not been getting much exercise lately due to this move.  Despite this she has lost 10 pounds in the last year by changing her diet.  She denies lower extremity edema, orthopnea, or PND.  She has been taking metoprolol once daily instead of twice because she thinks that the dose is too strong for her.  Her heart rate gets down into the 60s at times.  She denies any lightheadedness or dizziness other than occasional mild episodes when bending over.  Her PCP recommended that she restart a stati but was  hesitant to start atorvastatin because she thinks this caused her mother to develop lymphoma.  She also is resistant to rosuvastatin as she is thinks this caused her to have yeast infections.  She did consent to restarting pravastatin, though this has not been effective at getting her to goal in the past.  Katherine Koch has been well physically since her last appointment.  However she has been under a lot of stress lately.  Her oldest sister has been  very ill.  She lives in [place] and it has been challenging to deal with is from [place].  She also has a son who is estranged from the and makes her feel even more stressed.  She notes that she often forgets her medications due to all that is going on in her life right now.  She has been exercising regularly and working on her diet.  However lately she has been struggling with these things as well.  She had an episode of heart fluttering 2 weeks ago that lasted for a few moments.  She thinks that this was a day when she forgot to take her medicine.  She has not noticed any lower extremity edema, orthopnea, PND, chest pain, or shortness of breath.  Past Medical History:  Diagnosis Date  . Allergic rhinitis   . Dry eye syndrome   . Hyperlipemia   . Hypertension   . Osteopenia    per pt had a DEXA few years ago  . Shingles 3/10    Past Surgical History:  Procedure Laterality Date  . ABDOMINAL HYSTERECTOMY     B oophorectomy- remotely  . BREAST LUMPECTOMY Right    fibrocystic     Current Outpatient Medications  Medication Sig Dispense Refill  . amLODipine (NORVASC) 10 MG tablet Take 1 tablet (10 mg total) by mouth daily. 90 tablet  2  . aspirin 81 MG tablet Take 81 mg by mouth daily.      . Calcium Carbonate-Vitamin D (CALTRATE 600+D PO) Take by mouth as directed.     . chlorthalidone (HYGROTON) 25 MG tablet TAKE 1 TABLET BY MOUTH DAILY 90 tablet 0  . fluticasone (FLONASE) 50 MCG/ACT nasal spray Place 2 sprays daily as needed into both nostrils for allergies or rhinitis.    Marland Kitchen losartan (COZAAR) 100 MG tablet Take 1 tablet (100 mg total) by mouth daily. 90 tablet 2  . metoprolol tartrate (LOPRESSOR) 25 MG tablet Take 25 mg as directed by mouth. 1/2 tablet by mouth daily    . Multiple Vitamins-Minerals (MULTIVITAMINS) CHEW Chew 1 tablet by mouth daily.     . potassium chloride SA (K-DUR,KLOR-CON) 20 MEQ tablet Take 1 tablet (20 mEq total) by mouth daily. 90 tablet 1  . pravastatin  (PRAVACHOL) 40 MG tablet Take 1 tablet (40 mg total) by mouth at bedtime. 30 tablet 3  . spironolactone (ALDACTONE) 25 MG tablet Take 0.5 tablets (12.5 mg total) by mouth daily. 15 tablet 2   No current facility-administered medications for this visit.     Allergies:   Patient has no known allergies.    Social History:  The patient  reports that she has quit smoking. Her smoking use included cigarettes. she has never used smokeless tobacco. She reports that she does not drink alcohol or use drugs.   Family History:  The patient's family history includes Coronary artery disease in her mother; Hypertension in her brother and mother; Non-Hodgkin's lymphoma in her mother.    ROS:  Please see the history of present illness.   Otherwise, review of systems are positive for none.   All other systems are reviewed and negative.    PHYSICAL EXAM: VS:  BP (!) 142/80   Pulse 64   Ht 5\' 7"  (1.702 m)   Wt 141 lb 3.2 oz (64 kg)   SpO2 100%   BMI 22.12 kg/m  , BMI Body mass index is 22.12 kg/m. GENERAL:  Well appearing HEENT: Pupils equal round and reactive, fundi not visualized, oral mucosa unremarkable NECK:  No jugular venous distention, waveform within normal limits, carotid upstroke brisk and symmetric, no bruits LUNGS:  Clear to auscultation bilaterally HEART:  RRR.  PMI not displaced or sustained,S1 and S2 within normal limits, no S3, no S4, no clicks, no rubs, no murmurs ABD:  Flat, positive bowel sounds normal in frequency in pitch, no bruits, no rebound, no guarding, no midline pulsatile mass, no hepatomegaly, no splenomegaly EXT:  2 plus pulses throughout, no edema, no cyanosis no clubbing SKIN:  No rashes no nodules NEURO:  Cranial nerves II through XII grossly intact, motor grossly intact throughout PSYCH:  Cognitively intact, oriented to person place and time   EKG:  EKG is not ordered today. The ekg ordered 06/29/17 demonstrates sinus rhythm.  Rate 65 bpm. LVH with secondary  repolarization abnormalities.    Echo 03/28/14: Study Conclusions  - Left ventricle: The cavity size was normal. Wall thickness was increased in a pattern of severe LVH. Systolic function was normal. The estimated ejection fraction was in the range of 60% to 65%. Features are consistent with a pseudonormal left ventricular filling pattern, with concomitant abnormal relaxation and increased filling pressure (grade 2 diastolic dysfunction). - Mitral valve: Calcified annulus. Mildly thickened leaflets . There was mild regurgitation. - Left atrium: The atrium was mildly dilated.  Recent Labs: 01/04/2017: Hemoglobin 13.3; Platelets  256.0 05/24/2017: BUN 14; Creatinine, Ser 0.74; Potassium 4.1; Sodium 137 09/06/2017: ALT 18    Lipid Panel    Component Value Date/Time   CHOL 203 (H) 09/06/2017 1059   TRIG 91.0 09/06/2017 1059   HDL 59.60 09/06/2017 1059   CHOLHDL 3 09/06/2017 1059   VLDL 18.2 09/06/2017 1059   LDLCALC 125 (H) 09/06/2017 1059   LDLDIRECT 155.8 02/15/2012 1354      Wt Readings from Last 3 Encounters:  10/13/17 141 lb 3.2 oz (64 kg)  06/29/17 140 lb 11.2 oz (63.8 kg)  05/24/17 141 lb 2 oz (64 kg)      ASSESSMENT AND PLAN:  # Hyperlipidemia: LDL was 184 05/2017 and improved to 125 08/2017.  Less than 130 is a reasonable goal given that she does not have any ASCVD.  Continue pravastatin.  She also attributes this to eating oatmeal.  # Hypertension:  Blood pressure is elevated today.  However she admits to not taking her medications as prescribed due to family stress.  I recommended that she has associate her medicines with something she does every day such as brushing her teeth or drinking her morning coffee.  She expressed understanding.  Continue amlodipine, losartan, chlorthalidone, metoprolol, and spironolactone.  # Severe LVH: She is euvolemic and this is thought to be due to hypertensive heart disease.  Continue hypertension management as above.  No  changes.   Current medicines are reviewed at length with the patient today.  The patient does not have concerns regarding medicines.  The following changes have been made:  no change  Labs/ tests ordered today include:  No orders of the defined types were placed in this encounter.    Disposition:   FU with Bradley Bostelman C. Oval Linsey, MD, [facility] in 6 months.       Signed, Eunice Winecoff C. Oval Linsey, MD, [facility]  10/13/2017 11:31 AM    [place]

## 2017-10-17 ENCOUNTER — Ambulatory Visit: Payer: Medicare Other | Admitting: Internal Medicine

## 2017-10-19 ENCOUNTER — Encounter: Payer: Self-pay | Admitting: Internal Medicine

## 2017-10-19 ENCOUNTER — Ambulatory Visit (INDEPENDENT_AMBULATORY_CARE_PROVIDER_SITE_OTHER): Payer: Medicare Other | Admitting: Internal Medicine

## 2017-10-19 ENCOUNTER — Other Ambulatory Visit: Payer: Self-pay | Admitting: Internal Medicine

## 2017-10-19 VITALS — BP 140/78 | HR 67 | Temp 98.1°F | Resp 16 | Ht 67.0 in | Wt 142.0 lb

## 2017-10-19 DIAGNOSIS — I10 Essential (primary) hypertension: Secondary | ICD-10-CM

## 2017-10-19 MED ORDER — LISINOPRIL 20 MG PO TABS: 20.0000 mg | ORAL_TABLET | Freq: Every day | ORAL | 3 refills | Status: DC

## 2017-10-19 NOTE — Progress Notes (Signed)
Pre visit review using our clinic review tool, if applicable. No additional management support is needed unless otherwise documented below in the visit note. 

## 2017-10-19 NOTE — Patient Instructions (Signed)
GO TO THE FRONT DESK Schedule labs to be done in 2 weeks , no need to fast  Check the  blood pressure   daily Be sure your blood pressure is between 110/65 and  135/85. If it is consistently higher or lower, let me know  Stop losartan Start lisinopril 20 mg 1 a day

## 2017-10-19 NOTE — Assessment & Plan Note (Signed)
HTN: Currently on losartan, amlodipine, chlorthalidone, potassium, metoprolol.  Concerned about losartan d/t a recall and likes to switch to another agent.  Will try lisinopril 20 mg, low dose, watch for side effects such as cough, BMP in 2 weeks.  Monitor BPs.  See AVS Stress, anxiety: Counseled to the best of my ability.  Denies any need for any sleeping aids or medication at this point. Knows to call me if needed.  Encouraged to stay in contact with her family and discussed the benefits of physical activity to release stress.

## 2017-10-19 NOTE — Progress Notes (Signed)
Subjective:    Patient ID: Katherine Koch, female    DOB: 28-Mar-1946, 72 y.o.   MRN: [id number]  DOS:  10/19/2017 Type of visit - description : acute Interval history: Patient concerned about losartan recall, her pharmacist advised her to switch to another medication.  Her sisters were switched to lisinopril and she wonders about that. Also, under a lot of stress, her sister is very sick, sister lives in [place].  BP Readings from Last 3 Encounters:  10/19/17 140/78  10/13/17 (!) 142/80  06/29/17 124/72     Review of Systems  At this point he denies any side effects from losartan, denies specifically cough  Not sleeping very well lately + Stress but denies depression per se   Past Medical History:  Diagnosis Date  . Allergic rhinitis   . Dry eye syndrome   . Hyperlipemia   . Hypertension   . Osteopenia    per pt had a DEXA few years ago  . Shingles 3/10    Past Surgical History:  Procedure Laterality Date  . ABDOMINAL HYSTERECTOMY     B oophorectomy- remotely  . BREAST LUMPECTOMY Right    fibrocystic    Social History   Socioeconomic History  . Marital status: Single    Spouse name: Not on file  . Number of children: 1  . Years of education: Not on file  . Highest education level: Not on file  Social Needs  . Financial resource strain: Not on file  . Food insecurity - worry: Not on file  . Food insecurity - inability: Not on file  . Transportation needs - medical: Not on file  . Transportation needs - non-medical: Not on file  Occupational History  . 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 Smoking status: Former Smoker    Types: Cigarettes  . Smokeless tobacco: Never Used  . Tobacco comment: quit 2011, 1/2 ppd , age of onset? smoked off-on  Substance and Sexual Activity  . Alcohol use: No    Alcohol/week: 0.0 oz  . Drug use: No  . Sexual activity: Not on file  Other Topics Concern  . Not on file   Social History Narrative   Divorced, retired Air cabin crew    Daughter in [place] to [place] from [place] 11/09                Allergies as of 10/19/2017   No Known Allergies     Medication List        Accurate as of 10/19/17  4:43 PM. Always use your most recent med list.          amLODipine 10 MG tablet Commonly known as:  NORVASC Take 1 tablet (10 mg total) by mouth daily.   aspirin 81 MG tablet Take 81 mg by mouth daily.   CALTRATE 600+D PO Take by mouth as directed.   chlorthalidone 25 MG tablet Commonly known as:  HYGROTON Take 1 tablet (25 mg total) by mouth daily.   fluticasone 50 MCG/ACT nasal spray Commonly known as:  FLONASE Place 2 sprays daily as needed into both nostrils for allergies or rhinitis.   lisinopril 20 MG tablet Commonly known as:  PRINIVIL,ZESTRIL Take 1 tablet (20 mg total) by mouth daily.   metoprolol tartrate 25 MG tablet Commonly known as:  LOPRESSOR Take 25 mg as directed by mouth. 1/2 tablet by mouth daily   MULTIVITAMINS  Chew Chew 1 tablet by mouth daily.   potassium chloride SA 20 MEQ tablet Commonly known as:  K-DUR,KLOR-CON Take 1 tablet (20 mEq total) by mouth daily.   pravastatin 40 MG tablet Commonly known as:  PRAVACHOL Take 1 tablet (40 mg total) by mouth at bedtime.   spironolactone 25 MG tablet Commonly known as:  ALDACTONE Take 0.5 tablets (12.5 mg total) by mouth daily.          Objective:   Physical Exam BP 140/78 (BP Location: Right Arm, Patient Position: Sitting, Cuff Size: Small)   Pulse 67   Temp 98.1 F (36.7 C) (Oral)   Resp 16   Ht 5\' 7"  (1.702 m)   Wt 142 lb (64.4 kg)   SpO2 98%   BMI 22.24 kg/m  General:   Well developed, well nourished.  Very emotional, tearful when we talk about her sister.  HEENT:  Normocephalic . Face symmetric, atraumatic Lungs:  CTA B Normal respiratory effort, no intercostal retractions, no accessory muscle use. Heart: RRR,  no murmur.  No pretibial edema  bilaterally  Skin: Not pale. Not jaundice Neurologic:  alert & oriented X3.  Speech normal, gait appropriate for age and unassisted Psych--  Cognition and judgment appear intact.  Cooperative with normal attention span and concentration.  Behavior appropriate.      Assessment & Plan:   Assessment  Hyperglycemia: A1C 6.0  (05-2016) HTN Hyperlipidemia Osteopenia : 02-2010: T score -2.4;  Tscore -1.1 2015: Tscore -2.0 Allergic rhinitis Shingles ~2010 Dry eye syndrome Palpable abdominal aorta: US abdomen  12-2015: no AAA  PLAN: HTN: Currently on losartan, amlodipine, chlorthalidone, potassium, metoprolol.  Concerned about losartan d/t a recall and likes to switch to another agent.  Will try lisinopril 20 mg, low dose, watch for side effects such as cough, BMP in 2 weeks.  Monitor BPs.  See AVS Stress, anxiety: Counseled to the best of my ability.  Denies any need for any sleeping aids or medication at this point. Knows to call me if needed.  Encouraged to stay in contact with her family and discussed the benefits of physical activity to release stress.

## 2017-11-09 ENCOUNTER — Other Ambulatory Visit: Payer: Self-pay | Admitting: Cardiovascular Disease

## 2017-11-09 NOTE — Telephone Encounter (Signed)
°*  STAT* If patient is at the pharmacy, call can be transferred to refill team.   1. Which medications need to be refilled? (please list name of each medication and dose if known) Metoprolol-pt need this today please  2. Which pharmacy/location (including street and city if local pharmacy) is medication to be sent to?[phone]  3. Do they need a 30 day or 90 day supply? 30 and refills

## 2017-11-10 ENCOUNTER — Telehealth: Payer: Self-pay | Admitting: Cardiovascular Disease

## 2017-11-10 ENCOUNTER — Other Ambulatory Visit (INDEPENDENT_AMBULATORY_CARE_PROVIDER_SITE_OTHER): Payer: Medicare Other

## 2017-11-10 DIAGNOSIS — I10 Essential (primary) hypertension: Secondary | ICD-10-CM | POA: Diagnosis not present

## 2017-11-10 LAB — BASIC METABOLIC PANEL
BUN: 19 mg/dL (ref 6–23)
CHLORIDE: 101 meq/L (ref 96–112)
CO2: 29 meq/L (ref 19–32)
CREATININE: 0.69 mg/dL (ref 0.40–1.20)
Calcium: 10 mg/dL (ref 8.4–10.5)
GFR: 88.96 mL/min (ref >60.00)
GLUCOSE: 107 mg/dL — AB (ref 70–99)
Potassium: 3.8 mEq/L (ref 3.5–5.1)
Sodium: 137 mEq/L (ref 135–145)

## 2017-11-10 MED ORDER — METOPROLOL TARTRATE 25 MG PO TABS: 25.0000 mg | ORAL_TABLET | ORAL | 5 refills | Status: DC

## 2017-11-10 NOTE — Telephone Encounter (Signed)
PER OFFICE NOTE TAKE METOPROLOL 25 MG   1/2 TABLET  DAILY .   INFORMATION GIVEN TO PHARMACIST.

## 2017-11-10 NOTE — Telephone Encounter (Signed)
[employer] and exercise psychologist) is calling to get clarification on the Metoprolol , there are two sets of instructions .

## 2017-11-11 ENCOUNTER — Other Ambulatory Visit: Payer: Self-pay

## 2017-11-30 ENCOUNTER — Other Ambulatory Visit: Payer: Self-pay | Admitting: Internal Medicine

## 2017-11-30 DIAGNOSIS — Z139 Encounter for screening, unspecified: Secondary | ICD-10-CM

## 2017-12-08 ENCOUNTER — Other Ambulatory Visit: Payer: Self-pay | Admitting: Internal Medicine

## 2018-02-15 ENCOUNTER — Ambulatory Visit
Admission: RE | Admit: 2018-02-15 | Discharge: 2018-02-15 | Disposition: A | Discharge status: Home / Self Care | Payer: Medicare Other | Source: Ambulatory Visit | Attending: Internal Medicine | Admitting: Internal Medicine

## 2018-02-15 DIAGNOSIS — Z139 Encounter for screening, unspecified: Secondary | ICD-10-CM

## 2018-02-17 ENCOUNTER — Other Ambulatory Visit: Payer: Self-pay | Admitting: Internal Medicine

## 2018-02-17 ENCOUNTER — Other Ambulatory Visit: Payer: Self-pay

## 2018-02-17 DIAGNOSIS — N63 Unspecified lump in unspecified breast: Secondary | ICD-10-CM

## 2018-02-21 ENCOUNTER — Telehealth: Payer: Self-pay | Admitting: *Deleted

## 2018-02-21 NOTE — Telephone Encounter (Signed)
Received Physician Orders from [place]; forwarded to provider/SLS 07/09

## 2018-02-23 ENCOUNTER — Other Ambulatory Visit: Payer: Self-pay | Admitting: Internal Medicine

## 2018-03-01 ENCOUNTER — Ambulatory Visit
Admission: RE | Admit: 2018-03-01 | Discharge: 2018-03-01 | Disposition: A | Discharge status: Home / Self Care | Payer: Medicare Other | Source: Ambulatory Visit | Attending: Internal Medicine | Admitting: Internal Medicine

## 2018-03-01 DIAGNOSIS — N63 Unspecified lump in unspecified breast: Secondary | ICD-10-CM

## 2018-05-02 ENCOUNTER — Other Ambulatory Visit: Payer: Self-pay | Admitting: Internal Medicine

## 2018-05-02 ENCOUNTER — Other Ambulatory Visit: Payer: Self-pay | Admitting: Cardiovascular Disease

## 2018-05-17 ENCOUNTER — Ambulatory Visit: Payer: Medicare Other | Admitting: Cardiovascular Disease

## 2018-05-17 ENCOUNTER — Encounter: Payer: Self-pay | Admitting: Cardiovascular Disease

## 2018-05-17 VITALS — BP 158/82 | HR 70 | Ht 66.0 in | Wt 143.0 lb

## 2018-05-17 DIAGNOSIS — I517 Cardiomegaly: Secondary | ICD-10-CM

## 2018-05-17 DIAGNOSIS — E785 Hyperlipidemia, unspecified: Secondary | ICD-10-CM | POA: Diagnosis not present

## 2018-05-17 DIAGNOSIS — I10 Essential (primary) hypertension: Secondary | ICD-10-CM | POA: Diagnosis not present

## 2018-05-17 NOTE — Progress Notes (Signed)
Cardiology Office Note   Date:  05/17/2018   ID:  Katherine Koch, DOB [birth year], MRN [id number]  PCP:  Colon Branch, MD  Cardiologist:   Skeet Latch, MD   No chief complaint on file.    History of Present Illness: Katherine Koch is a 72 y.o. female with resistent hypertension, hyperlipidemia, and severe LVH who presents for follow up.  Katherine Koch was previously a patient of [name].  She was last seen 06/2016.  She had an echo 03/2014 that revealed LVEF 60-65% with grade 2 diastolic dysfunction.  She had severe left ventricular hypertrophy which was thought to be due to poorly controlled hypertension.  Her PCP recommended that she restart a statinbut was hesitant to start atorvastatin because she thinks this caused her mother to develop lymphoma.  She also is resistant to rosuvastatin as she is thinks this caused her to have yeast infections.  She did consent to restarting pravastatin, though this has not been effective at getting her to goal in the past.  Katherine Koch has been well physically since her last appointment.  However she has been under a lot of stress lately.  Her oldest sister has been very ill.  She lives in [place] and it has been challenging to deal with is from [place].  She also has a son who is estranged from the and makes her feel even more stressed.  Since her last appointment Katherine Koch has been well.  She has not been going to the gym anymore because she moved and got out of her routine.  She has no exertional chest pain or shortness of breath.  Her blood pressure at home has been mostly in the 110s over 60s to 70s.  She did not take her medication today because she thought she was going to double up on the dose that was taken late yesterday.  When she does take her blood pressure it is very well controlled.  She has a cough that has been present for the last 2 weeks.  It was initially much worse but in the last few days she has only coughed twice.  She  thinks this may be due to sinus drainage but cannot rule out that it is because the lisinopril.  She has no lower extremity edema, orthopnea, or PND.  Her main complaint is pain in her knees that she attributes to arthritis.  Past Medical History:  Diagnosis Date  . Allergic rhinitis   . Dry eye syndrome   . Hyperlipemia   . Hypertension   . Osteopenia    per pt had a DEXA few years ago  . Shingles 3/10    Past Surgical History:  Procedure Laterality Date  . ABDOMINAL HYSTERECTOMY     B oophorectomy- remotely  . BREAST EXCISIONAL BIOPSY Right      Current Outpatient Medications  Medication Sig Dispense Refill  . amLODipine (NORVASC) 10 MG tablet Take 1 tablet (10 mg total) by mouth daily. 90 tablet 1  . aspirin 81 MG tablet Take 81 mg by mouth daily.      . Calcium Carbonate-Vitamin D (CALTRATE 600+D PO) Take by mouth as directed.     . chlorthalidone (HYGROTON) 25 MG tablet Take 1 tablet (25 mg total) by mouth daily. 90 tablet 3  . fluticasone (FLONASE) 50 MCG/ACT nasal spray Place 2 sprays daily as needed into both nostrils for allergies or rhinitis.    Marland Kitchen lisinopril (PRINIVIL,ZESTRIL) 20 MG tablet Take 1  tablet (20 mg total) by mouth daily. 30 tablet 5  . metoprolol tartrate (LOPRESSOR) 25 MG tablet TAKE 1/2 TABLET DAILY 15 tablet 4  . Multiple Vitamins-Minerals (MULTIVITAMINS) CHEW Chew 1 tablet by mouth daily.     . potassium chloride SA (K-DUR,KLOR-CON) 20 MEQ tablet Take 1 tablet (20 mEq total) by mouth daily. 90 tablet 3  . pravastatin (PRAVACHOL) 40 MG tablet Take 1 tablet (40 mg total) by mouth at bedtime. 30 tablet 5  . spironolactone (ALDACTONE) 25 MG tablet Take 0.5 tablets (12.5 mg total) by mouth daily. 45 tablet 3   No current facility-administered medications for this visit.     Allergies:   Patient has no known allergies.    Social History:  The patient  reports that she has quit smoking. Her smoking use included cigarettes. She has never used smokeless  tobacco. She reports that she does not drink alcohol or use drugs.   Family History:  The patient's family history includes Coronary artery disease in her mother; Hypertension in her brother and mother; Non-Hodgkin's lymphoma in her mother.    ROS:  Please see the history of present illness.   Otherwise, review of systems are positive for none.   All other systems are reviewed and negative.    PHYSICAL EXAM: VS:  BP (!) 158/82   Pulse 70   Ht 5\' 6"  (1.676 m)   Wt 143 lb (64.9 kg)   SpO2 97%   BMI 23.08 kg/m  , BMI Body mass index is 23.08 kg/m. GENERAL:  Well appearing HEENT: Pupils equal round and reactive, fundi not visualized, oral mucosa unremarkable NECK:  No jugular venous distention, waveform within normal limits, carotid upstroke brisk and symmetric, no bruits LUNGS:  Clear to auscultation bilaterally HEART:  RRR.  PMI not displaced or sustained,S1 and S2 within normal limits, no S3, no S4, no clicks, no rubs, no murmurs ABD:  Flat, positive bowel sounds normal in frequency in pitch, no bruits, no rebound, no guarding, no midline pulsatile mass, no hepatomegaly, no splenomegaly EXT:  2 plus pulses throughout, no edema, no cyanosis no clubbing SKIN:  No rashes no nodules NEURO:  Cranial nerves II through XII grossly intact, motor grossly intact throughout PSYCH:  Cognitively intact, oriented to person place and time   EKG:  EKG is ordered today. The ekg ordered 06/29/17 demonstrates sinus rhythm.  Rate 65 bpm. LVH with secondary repolarization abnormalities.  05/17/2018: Sinus rhythm.  Rate 72 bpm.  LVH with secondary repolarization of normalities.   Echo 03/28/14: Study Conclusions  - Left ventricle: The cavity size was normal. Wall thickness was increased in a pattern of severe LVH. Systolic function was normal. The estimated ejection fraction was in the range of 60% to 65%. Features are consistent with a pseudonormal left ventricular filling pattern, with  concomitant abnormal relaxation and increased filling pressure (grade 2 diastolic dysfunction). - Mitral valve: Calcified annulus. Mildly thickened leaflets . There was mild regurgitation. - Left atrium: The atrium was mildly dilated.  Recent Labs: 09/06/2017: ALT 18 11/10/2017: BUN 19; Creatinine, Ser 0.69; Potassium 3.8; Sodium 137    Lipid Panel    Component Value Date/Time   CHOL 203 (H) 09/06/2017 1059   TRIG 91.0 09/06/2017 1059   HDL 59.60 09/06/2017 1059   CHOLHDL 3 09/06/2017 1059   VLDL 18.2 09/06/2017 1059   LDLCALC 125 (H) 09/06/2017 1059   LDLDIRECT 155.8 02/15/2012 1354      Wt Readings from Last 3 Encounters:  05/17/18  143 lb (64.9 kg)  10/19/17 142 lb (64.4 kg)  10/13/17 141 lb 3.2 oz (64 kg)      ASSESSMENT AND PLAN:  # Hyperlipidemia: LDL 125 08/2017.  She will see Dr. pause next month and will have labs checked at that time.  Continue pravastatin.  Her lipids have improved significantly since starting it.  # Hypertension:  Blood pressure is elevated today.  However she has not taken any medication today.  In general her blood pressure has been excellent.  Continue amlodipine, chlorthalidone, lisinopril, metoprolol, and spironolactone.  # Severe LVH: She is euvolemic and this is thought to be due to hypertensive heart disease.  Continue hypertension management as above.  No changes.   Current medicines are reviewed at length with the patient today.  The patient does not have concerns regarding medicines.  The following changes have been made:  no change  Labs/ tests ordered today include:   Orders Placed This Encounter  Procedures  . EKG 12-Lead     Disposition:   FU with Lillyahna Hemberger C. Oval Linsey, MD, [facility] in 6 months.       Signed, Jenipher Havel C. Oval Linsey, MD, [facility]  05/17/2018 11:47 AM    [facility] patient

## 2018-05-17 NOTE — Patient Instructions (Addendum)
Medication Instructions:  Your physician recommends that you continue on your current medications as directed. Please refer to the Current Medication list given to you today.  Labwork: NONE  Testing/Procedures: NONE  Follow-Up: Your physician wants you to follow-up in: 6 MONTHS  You will receive a reminder letter in the mail two months in advance. If you don't receive a letter, please call our office to schedule the follow-up appointment.  IF YOU NEED TO CONTACT THE OFFICE CALL [phone]

## 2018-05-26 ENCOUNTER — Ambulatory Visit: Payer: Medicare Other | Admitting: Internal Medicine

## 2018-05-26 ENCOUNTER — Encounter: Payer: Self-pay | Admitting: Internal Medicine

## 2018-05-26 VITALS — BP 120/84 | HR 64 | Temp 97.9°F | Resp 16 | Ht 66.0 in | Wt 144.5 lb

## 2018-05-26 DIAGNOSIS — R739 Hyperglycemia, unspecified: Secondary | ICD-10-CM

## 2018-05-26 DIAGNOSIS — I10 Essential (primary) hypertension: Secondary | ICD-10-CM

## 2018-05-26 DIAGNOSIS — E785 Hyperlipidemia, unspecified: Secondary | ICD-10-CM

## 2018-05-26 DIAGNOSIS — Z23 Encounter for immunization: Secondary | ICD-10-CM | POA: Diagnosis not present

## 2018-05-26 LAB — LIPID PANEL
CHOL/HDL RATIO: 4
Cholesterol: 213 mg/dL — ABNORMAL HIGH (ref 0–200)
HDL: 55.1 mg/dL (ref >39.00)
LDL CALC: 143 mg/dL — AB (ref 0–99)
NonHDL: 157.64
Triglycerides: 71 mg/dL (ref 0.0–149.0)
VLDL: 14.2 mg/dL (ref 0.0–40.0)

## 2018-05-26 LAB — COMPREHENSIVE METABOLIC PANEL
ALBUMIN: 4.7 g/dL (ref 3.5–5.2)
ALT: 17 U/L (ref 0–35)
AST: 19 U/L (ref 0–37)
Alkaline Phosphatase: 54 U/L (ref 39–117)
BUN: 19 mg/dL (ref 6–23)
CO2: 30 meq/L (ref 19–32)
CREATININE: 0.79 mg/dL (ref 0.40–1.20)
Calcium: 10.2 mg/dL (ref 8.4–10.5)
Chloride: 101 mEq/L (ref 96–112)
GFR: 75.98 mL/min (ref >60.00)
Glucose, Bld: 95 mg/dL (ref 70–99)
Potassium: 4.2 mEq/L (ref 3.5–5.1)
SODIUM: 138 meq/L (ref 135–145)
Total Bilirubin: 0.7 mg/dL (ref 0.2–1.2)
Total Protein: 7.6 g/dL (ref 6.0–8.3)

## 2018-05-26 LAB — CBC WITH DIFFERENTIAL/PLATELET
BASOS ABS: 0 10*3/uL (ref 0.0–0.1)
Basophils Relative: 1.1 % (ref 0.0–3.0)
EOS ABS: 0.1 10*3/uL (ref 0.0–0.7)
Eosinophils Relative: 2.5 % (ref 0.0–5.0)
HCT: 41.2 % (ref 36.0–46.0)
Hemoglobin: 13.6 g/dL (ref 12.0–15.0)
LYMPHS PCT: 26.9 % (ref 12.0–46.0)
Lymphs Abs: 0.9 10*3/uL (ref 0.7–4.0)
MCHC: 32.9 g/dL (ref 30.0–36.0)
MCV: 87.4 fl (ref 78.0–100.0)
Monocytes Absolute: 0.3 10*3/uL (ref 0.1–1.0)
Monocytes Relative: 8.2 % (ref 3.0–12.0)
Neutro Abs: 1.9 10*3/uL (ref 1.4–7.7)
Neutrophils Relative %: 61.3 % (ref 43.0–77.0)
PLATELETS: 255 10*3/uL (ref 150.0–400.0)
RBC: 4.72 Mil/uL (ref 3.87–5.11)
RDW: 13.1 % (ref 11.5–15.5)
WBC: 3.2 10*3/uL — AB (ref 4.0–10.5)

## 2018-05-26 LAB — HEMOGLOBIN A1C: HEMOGLOBIN A1C: 5.9 % (ref 4.6–6.5)

## 2018-05-26 MED ORDER — FLUTICASONE PROPIONATE 50 MCG/ACT NA SUSP: 2.0000 | Freq: Every day | NASAL | 5 refills | Status: DC | PRN

## 2018-05-26 NOTE — Progress Notes (Signed)
Pre visit review using our clinic review tool, if applicable. No additional management support is needed unless otherwise documented below in the visit note. 

## 2018-05-26 NOTE — Assessment & Plan Note (Signed)
Hyperglycemia: Check a A1c, encouraged to go back on a healthy diet and increase physical activity HTN: on Amlodipine, hygroton, lisinopril, metoprolol, potassium, Aldactone.  Check a CMP and CBC Hyperlipidemia: On Pravachol, checking a FLP Preventive care reviewed: Declined a CPX  when she called for this appointment Flu shot today, again declined other immunizations.  Last mammogram 02-2018, next colonoscopy 12-2018. RTC 8 months CPX.

## 2018-05-26 NOTE — Progress Notes (Signed)
Subjective:    Patient ID: Katherine Koch, female    DOB: [birth year], 72 y.o.   MRN: [id number]  DOS:  05/26/2018 Type of visit - description : f/u Interval history: HTN: Ambulatory BPs: 113, 500 with a diastolic of 75 High cholesterol: Good compliance to medication without apparent side effects. Prediabetes: Has a healthy lifestyle: No, here lately has "fell off the wagon". Saw cardiology, note reviewed   Review of Systems Denies chest pain no difficulty breathing No nausea, vomiting, diarrhea No lower extremity edema Still having stress, family related.  Past Medical History:  Diagnosis Date  . Allergic rhinitis   . Dry eye syndrome   . Hyperlipemia   . Hypertension   . Osteopenia    per pt had a DEXA few years ago  . Shingles 3/10    Past Surgical History:  Procedure Laterality Date  . ABDOMINAL HYSTERECTOMY     B oophorectomy- remotely  . BREAST EXCISIONAL BIOPSY Right     Social History   Socioeconomic History  . Marital status: Single    Spouse name: Not on file  . Number of children: 1  . Years of education: Not on file  . Highest education level: Not on file  Occupational History  . Occupation: retired     Fish farm manager: RETIRED    Comment: Lawyer  Social Needs  . Financial resource strain: Not on file  . Food insecurity:    Worry: Not on file    Inability: Not on file  . Transportation needs:    Medical: Not on file    Non-medical: Not on file  Tobacco Use  . Smoking status: Former Smoker    Types: Cigarettes  . Smokeless tobacco: Never Used  . Tobacco comment: quit 2011, 1/2 ppd , age of onset? smoked off-on  Substance and Sexual Activity  . Alcohol use: No    Alcohol/week: 0.0 standard drinks  . Drug use: No  . Sexual activity: Not on file  Lifestyle  . Physical activity:    Days per week: Not on file    Minutes per session: Not on file  . Stress: Not on file  Relationships  . Social connections:    Talks on phone: Not on  file    Gets together: Not on file    Attends religious service: Not on file    Active member of club or organization: Not on file    Attends meetings of clubs or organizations: Not on file    Relationship status: Not on file  . Intimate partner violence:    Fear of current or ex partner: Not on file    Emotionally abused: Not on file    Physically abused: Not on file    Forced sexual activity: Not on file  Other Topics Concern  . Not on file  Social History Narrative   Divorced, retired Air cabin crew    Daughter in [place] to [place] from [place] 11/09                Allergies as of 05/26/2018   No Known Allergies     Medication List        Accurate as of 05/26/18 12:57 PM. Always use your most recent med list.          amLODipine 10 MG tablet Commonly known as:  NORVASC Take 1 tablet (10 mg total) by mouth daily.   aspirin 81 MG tablet Take 81 mg by mouth daily.  CALTRATE 600+D PO Take by mouth as directed.   chlorthalidone 25 MG tablet Commonly known as:  HYGROTON Take 1 tablet (25 mg total) by mouth daily.   fluticasone 50 MCG/ACT nasal spray Commonly known as:  FLONASE Place 2 sprays into both nostrils daily as needed for allergies or rhinitis.   lisinopril 20 MG tablet Commonly known as:  PRINIVIL,ZESTRIL Take 1 tablet (20 mg total) by mouth daily.   metoprolol tartrate 25 MG tablet Commonly known as:  LOPRESSOR TAKE 1/2 TABLET DAILY   MULTIVITAMINS Chew Chew 1 tablet by mouth daily.   potassium chloride SA 20 MEQ tablet Commonly known as:  K-DUR,KLOR-CON Take 1 tablet (20 mEq total) by mouth daily.   pravastatin 40 MG tablet Commonly known as:  PRAVACHOL Take 1 tablet (40 mg total) by mouth at bedtime.   spironolactone 25 MG tablet Commonly known as:  ALDACTONE Take 0.5 tablets (12.5 mg total) by mouth daily.          Objective:   Physical Exam BP 120/84 (BP Location: Left Arm, Patient Position: Sitting, Cuff Size: Normal)    Pulse 64   Temp 97.9 F (36.6 C) (Oral)   Resp 16   Ht 5\' 6"  (1.676 m)   Wt 144 lb 8 oz (65.5 kg)   SpO2 98%   BMI 23.32 kg/m  General:   Well developed, NAD, see BMI.  HEENT:  Normocephalic . Face symmetric, atraumatic Lungs:  CTA B Normal respiratory effort, no intercostal retractions, no accessory muscle use. Heart: RRR,  no murmur.  no pretibial edema bilaterally  Abdomen:  Not distended, soft, non-tender. No rebound or rigidity.   Skin: Not pale. Not jaundice Neurologic:  alert & oriented X3.  Speech normal, gait appropriate for age and unassisted Psych--  Cognition and judgment appear intact.  Cooperative with normal attention span and concentration.  Behavior appropriate. No anxious or depressed appearing.     Assessment & Plan:   Assessment  Hyperglycemia: A1C 6.0  (05-2016) HTN Hyperlipidemia Osteopenia : 02-2010: T score -2.4;  Tscore -1.1 2015: Tscore -2.0 Allergic rhinitis Shingles ~2010 Dry eye syndrome Palpable abdominal aorta: US abdomen  12-2015: no AAA  PLAN: Hyperglycemia: Check a A1c, encouraged to go back on a healthy diet and increase physical activity HTN: on Amlodipine, hygroton, lisinopril, metoprolol, potassium, Aldactone.  Check a CMP and CBC Hyperlipidemia: On Pravachol, checking a FLP Preventive care reviewed: Declined a CPX  when she called for this appointment Flu shot today, again declined other immunizations.  Last mammogram 02-2018, next colonoscopy 12-2018. RTC 8 months CPX.

## 2018-05-26 NOTE — Patient Instructions (Signed)
GO TO THE LAB : Get the blood work     GO TO THE FRONT DESK Schedule your next appointment for a  Physical exam in 8 months

## 2018-08-03 ENCOUNTER — Other Ambulatory Visit: Payer: Self-pay | Admitting: Internal Medicine

## 2018-08-18 ENCOUNTER — Other Ambulatory Visit: Payer: Self-pay | Admitting: Internal Medicine

## 2018-08-24 ENCOUNTER — Telehealth: Payer: Self-pay
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
## 2018-08-24 NOTE — Telephone Encounter (Signed)
Spoke w/ Pt- informed of PCP recommendations. Pt verbalized understanding.  

## 2018-09-19 ENCOUNTER — Other Ambulatory Visit: Payer: Self-pay

## 2018-09-19 MED ORDER — METOPROLOL TARTRATE 25 MG PO TABS: 12.5000 mg | ORAL_TABLET | Freq: Every day | ORAL | 4 refills | Status: DC

## 2018-09-27 ENCOUNTER — Ambulatory Visit: Payer: Medicare Other | Admitting: Internal Medicine

## 2018-09-27 ENCOUNTER — Encounter: Payer: Self-pay | Admitting: Internal Medicine

## 2018-09-27 VITALS — BP 116/68 | HR 68 | Temp 98.3°F | Resp 16 | Ht 66.0 in | Wt 147.0 lb

## 2018-09-27 DIAGNOSIS — J019 Acute sinusitis, unspecified: Secondary | ICD-10-CM

## 2018-09-27 DIAGNOSIS — B349 Viral infection, unspecified: Secondary | ICD-10-CM | POA: Diagnosis not present

## 2018-09-27 LAB — POCT RAPID STREP A (OFFICE): Rapid Strep A Screen: NEGATIVE

## 2018-09-27 LAB — POCT INFLUENZA A/B
INFLUENZA A, POC: NEGATIVE
Influenza B, POC: NEGATIVE

## 2018-09-27 MED ORDER — FLUCONAZOLE 150 MG PO TABS: 150.0000 mg | ORAL_TABLET | Freq: Every day | ORAL | 0 refills | Status: DC

## 2018-09-27 MED ORDER — HYDROCODONE-HOMATROPINE 5-1.5 MG/5ML PO SYRP: 5.0000 mL | ORAL_SOLUTION | Freq: Every evening | ORAL | 0 refills | Status: DC | PRN

## 2018-09-27 MED ORDER — AZELASTINE HCL 0.1 % NA SOLN: 2.0000 | Freq: Every evening | NASAL | 3 refills | Status: DC | PRN

## 2018-09-27 MED ORDER — AMOXICILLIN 875 MG PO TABS: 875.0000 mg | ORAL_TABLET | Freq: Two times a day (BID) | ORAL | 0 refills | Status: DC

## 2018-09-27 NOTE — Progress Notes (Signed)
Subjective:    Patient ID: Katherine Koch, female    DOB: Jun 09, 1946, 73 y.o.   MRN: [id number]  DOS:  09/27/2018 Type of visit - description: acute Symptoms started 4 days ago with mild sore throat and sinus congestion. Symptoms quickly progressed to increase right-sided sinus pain, abundant green nasal postnasal dripping. No sick contacts, no recent trips.   Review of Systems  No fever or chills No nausea, vomiting, diarrhea No aches or pains +cough, on the spells, she cannot stop it, disturbing her sleep.  She thinks is mostly from postnasal dripping.  Denies chest congestion or wheezing.  Past Medical History:  Diagnosis Date  . Allergic rhinitis   . Dry eye syndrome   . Hyperlipemia   . Hypertension   . Osteopenia    per pt had a DEXA few years ago  . Shingles 3/10    Past Surgical History:  Procedure Laterality Date  . ABDOMINAL HYSTERECTOMY     B oophorectomy- remotely  . BREAST EXCISIONAL BIOPSY Right     Social History   Socioeconomic History  . Marital status: Single    Spouse name: Not on file  . Number of children: 1  . Years of education: Not on file  . Highest education level: Not on file  Occupational History  . Occupation: retired     Fish farm manager: RETIRED    Comment: Lawyer  Social Needs  . Financial resource strain: Not on file  . Food insecurity:    Worry: Not on file    Inability: Not on file  . Transportation needs:    Medical: Not on file    Non-medical: Not on file  Tobacco Use  . Smoking status: Former Smoker    Types: Cigarettes  . Smokeless tobacco: Never Used  . Tobacco comment: quit 2011, 1/2 ppd , age of onset? smoked off-on  Substance and Sexual Activity  . Alcohol use: No    Alcohol/week: 0.0 standard drinks  . Drug use: No  . Sexual activity: Not on file  Lifestyle  . Physical activity:    Days per week: Not on file    Minutes per session: Not on file  . Stress: Not on file  Relationships  . Social  connections:    Talks on phone: Not on file    Gets together: Not on file    Attends religious service: Not on file    Active member of club or organization: Not on file    Attends meetings of clubs or organizations: Not on file    Relationship status: Not on file  . Intimate partner violence:    Fear of current or ex partner: Not on file    Emotionally abused: Not on file    Physically abused: Not on file    Forced sexual activity: Not on file  Other Topics Concern  . Not on file  Social History Narrative   Divorced, retired Air cabin crew    Daughter in [place] to [place] from [place] 11/09                Allergies as of 09/27/2018   No Known Allergies     Medication List       Accurate as of September 27, 2018  6:34 PM. Always use your most recent med list.        amLODipine 10 MG tablet Commonly known as:  NORVASC Take 1 tablet (10 mg total) by mouth daily.   amoxicillin  875 MG tablet Commonly known as:  AMOXIL Take 1 tablet (875 mg total) by mouth 2 (two) times daily.   aspirin 81 MG tablet Take 81 mg by mouth daily.   azelastine 0.1 % nasal spray Commonly known as:  ASTELIN Place 2 sprays into both nostrils at bedtime as needed for rhinitis. Use in each nostril as directed   CALTRATE 600+D PO Take by mouth as directed.   chlorthalidone 25 MG tablet Commonly known as:  HYGROTON Take 1 tablet (25 mg total) by mouth daily.   fluconazole 150 MG tablet Commonly known as:  DIFLUCAN Take 1 tablet (150 mg total) by mouth daily.   fluticasone 50 MCG/ACT nasal spray Commonly known as:  FLONASE Place 2 sprays into both nostrils daily as needed for allergies or rhinitis.   HYDROcodone-homatropine 5-1.5 MG/5ML syrup Commonly known as:  HYCODAN Take 5 mLs by mouth at bedtime as needed for cough.   lisinopril 20 MG tablet Commonly known as:  PRINIVIL,ZESTRIL Take 1 tablet (20 mg total) by mouth daily.   metoprolol tartrate 25 MG tablet Commonly known as:   LOPRESSOR Take 0.5 tablets (12.5 mg total) by mouth daily.   MULTIVITAMINS Chew Chew 1 tablet by mouth daily.   potassium chloride SA 20 MEQ tablet Commonly known as:  K-DUR,KLOR-CON Take 1 tablet (20 mEq total) by mouth daily.   pravastatin 40 MG tablet Commonly known as:  PRAVACHOL Take 1 tablet (40 mg total) by mouth at bedtime.   spironolactone 25 MG tablet Commonly known as:  ALDACTONE Take 0.5 tablets (12.5 mg total) by mouth daily.           Objective:   Physical Exam BP 116/68 (BP Location: Right Arm, Patient Position: Sitting, Cuff Size: Small)   Pulse 68   Temp 98.3 F (36.8 C) (Oral)   Resp 16   Ht 5\' 6"  (1.676 m)   Wt 147 lb (66.7 kg)   SpO2 96%   BMI 23.73 kg/m   General:   Well developed, NAD, BMI noted. HEENT:  Normocephalic . Face symmetric, atraumatic. TMs normal, nose quite congested. Sinuses: Definitely TTP at the right maxillary area.  Other areas normal. Throat symmetric. Lungs:  Few rhonchi. Normal respiratory effort, no intercostal retractions, no accessory muscle use. Heart: RRR,  no murmur.  No pretibial edema bilaterally  Skin: Not pale. Not jaundice Neurologic:  alert & oriented X3.  Speech normal, gait appropriate for age and unassisted Psych--  Cognition and judgment appear intact.  Cooperative with normal attention span and concentration.  Behavior appropriate. No anxious or depressed appearing.      Assessment     Assessment  Hyperglycemia: A1C 6.0  (05-2016) HTN Hyperlipidemia Osteopenia : 02-2010: T score -2.4;  Tscore -1.1 2015: Tscore -2.0 Allergic rhinitis Shingles ~2010 Dry eye syndrome Palpable abdominal aorta: US abdomen  12-2015: no AAA  PLAN: Sinusitis: Symptoms consistent with sinusitis, strep and influenza tests negative. Plan: Mucinex, Flonase, Astelin, amoxicillin. Cough is severe, will prescribe hydrocodone at night, s/e d/w pt Requests Diflucan.  Will do. Hyperlipidemia: Based on the last FLP, I  recommend to switch Pravachol to Lipitor, she declined.  Is working on her diet.

## 2018-09-27 NOTE — Assessment & Plan Note (Signed)
Sinusitis: Symptoms consistent with sinusitis, strep and influenza tests negative. Plan: Mucinex, Flonase, Astelin, amoxicillin. Cough is severe, will prescribe hydrocodone at night, s/e d/w pt Requests Diflucan.  Will do. Hyperlipidemia: Based on the last FLP, I recommend to switch Pravachol to Lipitor, she declined.  Is working on her diet.

## 2018-09-27 NOTE — Patient Instructions (Signed)
Rest, fluids , tylenol  For cough:  Take Mucinex DM twice a day as needed until better Also hydrocodone at night if you have persistent cough   For nasal congestion: Use OTC  Flonase : 2 nasal sprays on each side of the nose in the morning until you feel better Use ASTELIN a prescribed spray : 2 nasal sprays on each side of the nose at night until you feel better   Avoid decongestants such as  Pseudoephedrine or phenylephrine    Take the antibiotic as prescribed  (Amoxicillin)  Call if not gradually better over the next  10 days  Call anytime if the symptoms are severe

## 2018-09-27 NOTE — Progress Notes (Signed)
Pre visit review using our clinic review tool, if applicable. No additional management support is needed unless otherwise documented below in the visit note. 

## 2018-10-17 ENCOUNTER — Telehealth: Payer: Self-pay | Admitting: Cardiovascular Disease

## 2018-10-17 ENCOUNTER — Other Ambulatory Visit: Payer: Self-pay

## 2018-10-17 MED ORDER — METOPROLOL TARTRATE 25 MG PO TABS: 12.5000 mg | ORAL_TABLET | Freq: Every day | ORAL | 3 refills | Status: DC

## 2018-10-17 MED ORDER — CHLORTHALIDONE 25 MG PO TABS: 25.0000 mg | ORAL_TABLET | Freq: Every day | ORAL | 3 refills | Status: DC

## 2018-10-17 MED ORDER — SPIRONOLACTONE 25 MG PO TABS: 12.5000 mg | ORAL_TABLET | Freq: Every day | ORAL | 3 refills | Status: DC

## 2018-10-17 NOTE — Telephone Encounter (Signed)
New Message    *STAT* If patient is at the pharmacy, call can be transferred to refill team.   1. Which medications need to be refilled? (please list name of each medication and dose if known) Chlorthalidone 25mg  and Spironolactone 25mg , Metoprolol 25mg   2. Which pharmacy/location (including street and city if local pharmacy) is medication to be sent to?  [facility] in [place]   3. Do they need a 30 day or 90 day supply? 90 day supply

## 2018-10-17 NOTE — Telephone Encounter (Signed)
Rx's sent to patient's pharmacy

## 2018-10-19 ENCOUNTER — Other Ambulatory Visit: Payer: Self-pay | Admitting: Internal Medicine

## 2018-10-31 ENCOUNTER — Other Ambulatory Visit: Payer: Self-pay | Admitting: Internal Medicine

## 2018-10-31 ENCOUNTER — Other Ambulatory Visit: Payer: Self-pay | Admitting: Cardiology

## 2018-10-31 DIAGNOSIS — I10 Essential (primary) hypertension: Secondary | ICD-10-CM

## 2018-10-31 NOTE — Telephone Encounter (Signed)
Who previously filled scripts

## 2018-11-14 ENCOUNTER — Other Ambulatory Visit: Payer: Self-pay

## 2018-11-14 ENCOUNTER — Encounter: Payer: Self-pay | Admitting: Internal Medicine

## 2018-11-14 ENCOUNTER — Telehealth: Payer: Self-pay

## 2018-11-14 ENCOUNTER — Ambulatory Visit (INDEPENDENT_AMBULATORY_CARE_PROVIDER_SITE_OTHER): Payer: Medicare Other | Admitting: Internal Medicine

## 2018-11-14 DIAGNOSIS — R05 Cough: Secondary | ICD-10-CM | POA: Diagnosis not present

## 2018-11-14 DIAGNOSIS — R059 Cough, unspecified: Secondary | ICD-10-CM

## 2018-11-14 MED ORDER — HYDROCODONE-HOMATROPINE 5-1.5 MG/5ML PO SYRP: 5.0000 mL | ORAL_SOLUTION | Freq: Every evening | ORAL | 0 refills | Status: DC | PRN

## 2018-11-14 NOTE — Telephone Encounter (Signed)
Telephone visit scheduled at 4:30pm today with PCP (no internet).

## 2018-11-14 NOTE — Telephone Encounter (Signed)
Copied from [place] [phone]. Topic: General - Inquiry >> Nov 14, 2018 11:07 AM Margot Ables wrote: Reason for CRM: PEC VM from pt. Pt recently trx for sinus infection. Pt having continued cough, post nasal drip, irritated nose and throat. Pt using nasal spray but this has been going on for a while. Pt asking for possible course of abx again. Requesting call back.

## 2018-11-14 NOTE — Progress Notes (Signed)
Subjective:    Patient ID: Katherine Koch, female    DOB: 24-Jul-1946, 73 y.o.   MRN: [id number]  DOS:  11/14/2018 Type of visit - description: Virtual Visit via Telephone Note  I connected with@ on 11/15/18 at  4:40 PM EDT by telephone and verified that I am speaking with the correct person using two identifiers.  THIS ENCOUNTER IS A VIRTUAL VISIT DUE TO COVID-19 - PATIENT WAS NOT SEEN IN THE OFFICE. PATIENT HAS CONSENTED TO VIRTUAL VISIT / TELEMEDICINE VISIT   Location of patient: home  Location of provider: office  I discussed the limitations, risks, security and privacy concerns of performing an evaluation and management service by telephone and the availability of in person appointments. I also discussed with the patient that there may be a patient responsible charge related to this service. The patient expressed understanding and agreed to proceed.   History of Present Illness:  The patient was seen few weeks ago with sinusitis and prescribed amoxicillin. Since then she is somewhat better but has a lingering cough and postnasal dripping. The dripping is clear fluid, she thinks is making her cough. Has been taking inconsistently Flonase, Claritin.  Is not taking Mucinex DM or Astelin. She was unable to fill the Rx for hydrocodone at her regular pharmacy (they run out)   Review of Systems She denies fever chills No chest pain, difficulty breathing or myalgias. No heartburn. "My chest is clear". Denies sinus pain, just a clear postnasal dripping. Has some mild sneezing, denies itchy eyes or nose.  Past Medical History:  Diagnosis Date  . Allergic rhinitis   . Dry eye syndrome   . Hyperlipemia   . Hypertension   . Osteopenia    per pt had a DEXA few years ago  . Shingles 3/10    Past Surgical History:  Procedure Laterality Date  . ABDOMINAL HYSTERECTOMY     B oophorectomy- remotely  . BREAST EXCISIONAL BIOPSY Right     Social History   Socioeconomic History  .  Marital status: Single    Spouse name: Not on file  . Number of children: 1  . Years of education: Not on file  . Highest education level: Not on file  Occupational History  . Occupation: retired     Fish farm manager: RETIRED    Comment: Lawyer  Social Needs  . Financial resource strain: Not on file  . Food insecurity:    Worry: Not on file    Inability: Not on file  . Transportation needs:    Medical: Not on file    Non-medical: Not on file  Tobacco Use  . Smoking status: Former Smoker    Types: Cigarettes  . Smokeless tobacco: Never Used  . Tobacco comment: quit 2011, 1/2 ppd , age of onset? smoked off-on  Substance and Sexual Activity  . Alcohol use: No    Alcohol/week: 0.0 standard drinks  . Drug use: No  . Sexual activity: Not on file  Lifestyle  . Physical activity:    Days per week: Not on file    Minutes per session: Not on file  . Stress: Not on file  Relationships  . Social connections:    Talks on phone: Not on file    Gets together: Not on file    Attends religious service: Not on file    Active member of club or organization: Not on file    Attends meetings of clubs or organizations: Not on file    Relationship  status: Not on file  . Intimate partner violence:    Fear of current or ex partner: Not on file    Emotionally abused: Not on file    Physically abused: Not on file    Forced sexual activity: Not on file  Other Topics Concern  . Not on file  Social History Narrative   Divorced, retired Air cabin crew    Daughter in [place] to [place] from [place] 11/09                Allergies as of 11/14/2018   No Known Allergies     Medication List       Accurate as of November 14, 2018  4:42 PM. Always use your most recent med list.        amLODipine 10 MG tablet Commonly known as:  NORVASC Take 1 tablet (10 mg total) by mouth daily.   amoxicillin 875 MG tablet Commonly known as:  AMOXIL Take 1 tablet (875 mg total) by mouth 2 (two) times  daily.   aspirin 81 MG tablet Take 81 mg by mouth daily.   azelastine 0.1 % nasal spray Commonly known as:  ASTELIN Place 2 sprays into both nostrils at bedtime as needed for rhinitis. Use in each nostril as directed   CALTRATE 600+D PO Take by mouth as directed.   chlorthalidone 25 MG tablet Commonly known as:  HYGROTON Take 1 tablet (25 mg total) by mouth daily.   fluconazole 150 MG tablet Commonly known as:  DIFLUCAN Take 1 tablet (150 mg total) by mouth daily.   fluticasone 50 MCG/ACT nasal spray Commonly known as:  FLONASE Place 2 sprays into both nostrils daily as needed for allergies or rhinitis.   HYDROcodone-homatropine 5-1.5 MG/5ML syrup Commonly known as:  HYCODAN Take 5 mLs by mouth at bedtime as needed for cough.   lisinopril 20 MG tablet Commonly known as:  PRINIVIL,ZESTRIL Take 1 tablet (20 mg total) by mouth daily.   metoprolol tartrate 25 MG tablet Commonly known as:  LOPRESSOR Take 0.5 tablets (12.5 mg total) by mouth daily.   Multivitamins Chew Chew 1 tablet by mouth daily.   potassium chloride SA 20 MEQ tablet Commonly known as:  K-DUR,KLOR-CON Take 1 tablet (20 mEq total) by mouth daily.   pravastatin 40 MG tablet Commonly known as:  PRAVACHOL Take 1 tablet (40 mg total) by mouth at bedtime.   spironolactone 25 MG tablet Commonly known as:  ALDACTONE Take 0.5 tablets (12.5 mg total) by mouth daily.           Objective:   Physical Exam There were no vitals taken for this visit. This is phone, virtual visit.  She sounded well, in no distress    Assessment     Assessment  Hyperglycemia: A1C 6.0  (05-2016) HTN Hyperlipidemia Osteopenia : 02-2010: T score -2.4;  Tscore -1.1 2015: Tscore -2.0 Allergic rhinitis Shingles ~2010 Dry eye syndrome Palpable abdominal aorta: US abdomen  12-2015: no AAA  PLAN: Cough: Has persisting cough, no fever, chest congestion or sputum production.  She is on chronic lisinopril, no h/o previous  persistent  cough before. We agreed on the following: Consistent use of Flonase, Astelin, OTC Claritin, Mucinex DM. Will re-send a prescription for hydrocodone to our pharmacy hoping that she is able to get it filled. If the above fails, she will let me know, consider a second round of antibiotic, atypical infection?.  Other consideration is to stop ACE inhibitors.   I  discussed the assessment and treatment plan with the patient. The patient was provided an opportunity to ask questions and all were answered. The patient agreed with the plan and demonstrated an understanding of the instructions.   The patient was advised to call back or seek an in-person evaluation if the symptoms worsen or if the condition fails to improve as anticipated.  I provided 18 minutes of non-face-to-face time during this encounter.  Kathlene November, MD

## 2018-11-15 MED FILL — HYDROCODONE-HOMATROPINE SYR: 5-1.5 | 24 days supply | Qty: 120 | Fill #0

## 2018-11-15 NOTE — Assessment & Plan Note (Signed)
Cough: Has persisting cough, no fever, chest congestion or sputum production.  She is on chronic lisinopril, no h/o previous persistent  cough before. We agreed on the following: Consistent use of Flonase, Astelin, OTC Claritin, Mucinex DM. Will re-send a prescription for hydrocodone to our pharmacy hoping that she is able to get it filled. If the above fails, she will let me know, consider a second round of antibiotic, atypical infection?.  Other consideration is to stop ACE inhibitors.

## 2018-11-16 MED ORDER — POTASSIUM CHLORIDE CRYS ER 20 MEQ PO TBCR: 20.0000 meq | EXTENDED_RELEASE_TABLET | Freq: Every day | ORAL | 3 refills | Status: DC

## 2018-11-20 ENCOUNTER — Other Ambulatory Visit: Payer: Self-pay | Admitting: Cardiovascular Disease

## 2018-11-20 DIAGNOSIS — I10 Essential (primary) hypertension: Secondary | ICD-10-CM

## 2018-11-20 MED ORDER — POTASSIUM CHLORIDE CRYS ER 20 MEQ PO TBCR: 20.0000 meq | EXTENDED_RELEASE_TABLET | Freq: Every day | ORAL | 0 refills | Status: DC

## 2018-11-20 NOTE — Telephone Encounter (Signed)
°*  STAT* If patient is at the pharmacy, call can be transferred to refill team.   1. Which medications need to be refilled? (please list name of each medication and dose if known) potassium chloride SA (K-DUR,KLOR-CON) 20 MEQ tablet  2. Which pharmacy/location (including street and city if local pharmacy) is medication to be sent to? [id number] - [place], [place] - [address]  3. Do they need a 30 day or 90 day supply? 90 days

## 2018-11-30 ENCOUNTER — Encounter: Payer: Medicare Other | Admitting: Internal Medicine

## 2018-12-13 ENCOUNTER — Other Ambulatory Visit: Payer: Self-pay

## 2018-12-13 ENCOUNTER — Emergency Department (HOSPITAL_BASED_OUTPATIENT_CLINIC_OR_DEPARTMENT_OTHER)
Admission: EM | Admit: 2018-12-13 | Discharge: 2018-12-13 | Discharge status: Absent without leave | Payer: Medicare Other | Attending: Emergency Medicine | Admitting: Emergency Medicine

## 2018-12-29 ENCOUNTER — Encounter: Payer: Self-pay | Admitting: Gastroenterology

## 2019-01-04 ENCOUNTER — Ambulatory Visit (AMBULATORY_SURGERY_CENTER): Payer: Self-pay

## 2019-01-04 ENCOUNTER — Other Ambulatory Visit: Payer: Self-pay

## 2019-01-04 VITALS — Ht 66.0 in | Wt 150.0 lb

## 2019-01-04 DIAGNOSIS — Z1211 Encounter for screening for malignant neoplasm of colon: Secondary | ICD-10-CM

## 2019-01-04 MED ORDER — NA SULFATE-K SULFATE-MG SULF 17.5-3.13-1.6 GM/177ML PO SOLN: 1.0000 | Freq: Once | ORAL | 0 refills | Status: AC

## 2019-01-04 NOTE — Progress Notes (Signed)
Denies allergies to eggs or soy products. Denies complication of anesthesia or sedation. Denies use of weight loss medication. Denies use of O2.   Emmi instructions given for colonoscopy.  Pre-Visit was conducted by phone due to Covid 19. Instructions were reviewed and mailed to patients confirmed home address. Patient was encouraged to call if she had any questions or concerns regarding instructions.  

## 2019-01-09 ENCOUNTER — Encounter: Payer: Self-pay | Admitting: Gastroenterology

## 2019-01-11 ENCOUNTER — Telehealth: Payer: Self-pay | Admitting: Gastroenterology

## 2019-01-11 NOTE — Telephone Encounter (Signed)
Pls call pt, she has questions regarding prep.

## 2019-01-11 NOTE — Telephone Encounter (Signed)
Patient stating the prep that was called to her pharmacy is not what was discussed.

## 2019-01-11 NOTE — Telephone Encounter (Signed)
Returned patients call regarding prep instructions. Patient was accidentally given suprep instructions and picked up the prep however Golytely was sent to the pharmacy. New instructions for Suprep were mailed to the patient. Patient was instructed to call if she had any questions or concerns regarding the new instructions.

## 2019-01-22 ENCOUNTER — Telehealth: Payer: Self-pay | Admitting: *Deleted

## 2019-01-22 ENCOUNTER — Telehealth: Payer: Self-pay | Admitting: Gastroenterology

## 2019-01-22 ENCOUNTER — Encounter: Payer: Self-pay | Admitting: *Deleted

## 2019-01-22 NOTE — Telephone Encounter (Signed)
Returned pt's call the patient . Pt was originally given instructions for Golytely but has been changed to Suprep and has new instructions for taking Suprep and pt wanted to make sure she did not need to take Dulcolax with Suprep.reviewed Suprep instructions w/pt and told her to only follow Suprep instructions.Pt understood.

## 2019-01-22 NOTE — Telephone Encounter (Signed)
Message left

## 2019-01-22 NOTE — Telephone Encounter (Signed)

## 2019-01-23 ENCOUNTER — Encounter: Payer: Self-pay | Admitting: Gastroenterology

## 2019-01-23 ENCOUNTER — Other Ambulatory Visit: Payer: Self-pay

## 2019-01-23 ENCOUNTER — Ambulatory Visit (AMBULATORY_SURGERY_CENTER): Payer: Medicare Other | Admitting: Gastroenterology

## 2019-01-23 VITALS — BP 130/77 | HR 64 | Temp 98.7°F | Resp 14 | Ht 66.0 in | Wt 150.0 lb

## 2019-01-23 DIAGNOSIS — Z1211 Encounter for screening for malignant neoplasm of colon: Secondary | ICD-10-CM | POA: Diagnosis not present

## 2019-01-23 MED ORDER — SODIUM CHLORIDE 0.9 % IV SOLN: 500.0000 mL | Freq: Once | INTRAVENOUS | Status: DC

## 2019-01-23 NOTE — Patient Instructions (Signed)
YOU HAD AN ENDOSCOPIC PROCEDURE TODAY AT THE [facility]:   Refer to the procedure report that was given to you for any specific questions about what was found during the examination.  If the procedure report does not answer your questions, please call your gastroenterologist to clarify.  If you requested that your care partner not be given the details of your procedure findings, then the procedure report has been included in a sealed envelope for you to review at your convenience later.  YOU SHOULD EXPECT: Some feelings of bloating in the abdomen. Passage of more gas than usual.  Walking can help get rid of the air that was put into your GI tract during the procedure and reduce the bloating. If you had a lower endoscopy (such as a colonoscopy or flexible sigmoidoscopy) you may notice spotting of blood in your stool or on the toilet paper. If you underwent a bowel prep for your procedure, you may not have a normal bowel movement for a few days.  Please Note:  You might notice some irritation and congestion in your nose or some drainage.  This is from the oxygen used during your procedure.  There is no need for concern and it should clear up in a day or so.  SYMPTOMS TO REPORT IMMEDIATELY:   Following lower endoscopy (colonoscopy or flexible sigmoidoscopy):  Excessive amounts of blood in the stool  Significant tenderness or worsening of abdominal pains  Swelling of the abdomen that is new, acute  Fever of 100F or higher  For urgent or emergent issues, a gastroenterologist can be reached at any hour by calling [phone].   DIET:  We do recommend a small meal at first, but then you may proceed to your regular diet.  Drink plenty of fluids but you should avoid alcoholic beverages for 24 hours.  ACTIVITY:  You should plan to take it easy for the rest of today and you should NOT DRIVE or use heavy machinery until tomorrow (because of the sedation medicines used during the test).     FOLLOW UP: Our staff will call the number listed on your records 48-72 hours following your procedure to check on you and address any questions or concerns that you may have regarding the information given to you following your procedure. If we do not reach you, we will leave a message.  We will attempt to reach you two times.  During this call, we will ask if you have developed any symptoms of COVID 19. If you develop any symptoms (ie: fever, flu-like symptoms, shortness of breath, cough etc.) before then, please call [phone].  If you test positive for Covid 19 in the 2 weeks post procedure, please call and report this information to us.    If any biopsies were taken you will be contacted by phone or by letter within the next 1-3 weeks.  Please call us at [phone] if you have not heard about the biopsies in 3 weeks.    SIGNATURES/CONFIDENTIALITY: You and/or your care partner have signed paperwork which will be entered into your electronic medical record.  These signatures attest to the fact that that the information above on your After Visit Summary has been reviewed and is understood.  Full responsibility of the confidentiality of this discharge information lies with you and/or your care-partner. 

## 2019-01-23 NOTE — Progress Notes (Signed)
To PACU, VSS. Report to [email or web address] 

## 2019-01-23 NOTE — Progress Notes (Signed)
Pt's states no medical or surgical changes since previsit or office visit. 

## 2019-01-23 NOTE — Op Note (Signed)
[place] Patient Name: Katherine Koch Procedure Date: 01/23/2019 12:25 PM MRN: [id number] Endoscopist: Milus Banister , MD Age: 73 Referring MD:  Date of Birth: 12/28/1945 Gender: Female Account #: [id number] Procedure:                Colonoscopy Indications:              Screening for colorectal malignant neoplasm Medicines:                Monitored Anesthesia Care Procedure:                Pre-Anesthesia Assessment:                           - Prior to the procedure, a History and Physical                            was performed, and patient medications and                            allergies were reviewed. The patient's tolerance of                            previous anesthesia was also reviewed. The risks                            and benefits of the procedure and the sedation                            options and risks were discussed with the patient.                            All questions were answered, and informed consent                            was obtained. Prior Anticoagulants: The patient has                            taken no previous anticoagulant or antiplatelet                            agents. ASA Grade Assessment: II - A patient with                            mild systemic disease. After reviewing the risks                            and benefits, the patient was deemed in                            satisfactory condition to undergo the procedure.                           After obtaining informed consent, the colonoscope  was passed under direct vision. Throughout the                            procedure, the patient's blood pressure, pulse, and                            oxygen saturations were monitored continuously. The                            Colonoscope was introduced through the anus and                            advanced to the the cecum, identified by                            appendiceal orifice and  ileocecal valve. The                            colonoscopy was performed without difficulty. The                            patient tolerated the procedure well. The quality                            of the bowel preparation was good. The ileocecal                            valve, appendiceal orifice, and rectum were                            photographed. Scope In: 1:05:34 PM Scope Out: 1:14:48 PM Scope Withdrawal Time: 0 hours 6 minutes 9 seconds  Total Procedure Duration: 0 hours 9 minutes 14 seconds  Findings:                 The entire examined colon appeared normal on direct                            and retroflexion views. Complications:            No immediate complications. Estimated blood loss:                            None. Estimated Blood Loss:     Estimated blood loss: none. Impression:               - The entire examined colon is normal on direct and                            retroflexion views.                           - No specimens collected. Recommendation:           - Patient has a contact number available for  emergencies. The signs and symptoms of potential                            delayed complications were discussed with the                            patient. Return to normal activities tomorrow.                            Written discharge instructions were provided to the                            patient.                           - Resume previous diet.                           - Continue present medications.                           You do not need any further colon cancer screening                            tests (including stool testing). These types of                            tests generally stop around age 66-80. Milus Banister, MD 01/23/2019 1:20:48 PM This report has been signed electronically.

## 2019-01-23 NOTE — Progress Notes (Signed)
Temp taken by [facility] Signs Vs taken by Pollyann Kennedy.

## 2019-01-25 ENCOUNTER — Telehealth: Payer: Self-pay | Admitting: *Deleted

## 2019-01-25 NOTE — Telephone Encounter (Signed)
  Follow up Call-  Call back number 01/23/2019  Post procedure Call Back phone  # [phone]  Permission to leave phone message Yes  Some recent data might be hidden     Patient questions:  Do you have a fever, pain , or abdominal swelling? No. Pain Score  0 *  Have you tolerated food without any problems? Yes.    Have you been able to return to your normal activities? Yes.    Do you have any questions about your discharge instructions: Diet   No. Medications  No. Follow up visit  No.  Do you have questions or concerns about your Care? No.  Actions: * If pain score is 4 or above: No action needed, pain <4.  1. Have you developed a fever since your procedure? no  2.   Have you had an respiratory symptoms (SOB or cough) since your procedure? no  3.   Have you tested positive for COVID 19 since your procedure no  4.   Have you had any family members/close contacts diagnosed with the COVID 19 since your procedure? no   If yes to any of these questions please route to Joylene John, RN and Alphonsa Gin, Therapist, sports.

## 2019-02-09 ENCOUNTER — Other Ambulatory Visit: Payer: Self-pay

## 2019-02-09 ENCOUNTER — Ambulatory Visit (INDEPENDENT_AMBULATORY_CARE_PROVIDER_SITE_OTHER): Payer: Medicare Other | Admitting: Internal Medicine

## 2019-02-09 ENCOUNTER — Encounter: Payer: Self-pay | Admitting: Internal Medicine

## 2019-02-09 VITALS — BP 145/75 | HR 84 | Temp 98.1°F | Resp 16 | Ht 66.0 in | Wt 149.1 lb

## 2019-02-09 DIAGNOSIS — R011 Cardiac murmur, unspecified: Secondary | ICD-10-CM

## 2019-02-09 DIAGNOSIS — E785 Hyperlipidemia, unspecified: Secondary | ICD-10-CM | POA: Diagnosis not present

## 2019-02-09 DIAGNOSIS — R739 Hyperglycemia, unspecified: Secondary | ICD-10-CM | POA: Diagnosis not present

## 2019-02-09 DIAGNOSIS — R05 Cough: Secondary | ICD-10-CM

## 2019-02-09 DIAGNOSIS — Z0001 Encounter for general adult medical examination with abnormal findings: Secondary | ICD-10-CM

## 2019-02-09 DIAGNOSIS — R059 Cough, unspecified: Secondary | ICD-10-CM

## 2019-02-09 DIAGNOSIS — Z0000 Encounter for general adult medical examination without abnormal findings: Secondary | ICD-10-CM

## 2019-02-09 MED ORDER — LOSARTAN POTASSIUM 50 MG PO TABS: 50.0000 mg | ORAL_TABLET | Freq: Every day | ORAL | 1 refills | Status: DC

## 2019-02-09 NOTE — Progress Notes (Signed)
Subjective:    Patient ID: Katherine Koch, female    DOB: [birth year], 73 y.o.   MRN: [id number]  DOS:  02/09/2019 Type of visit - description: cpx In general feeling well. For the last 3 months she continue with on and off dry cough, she thinks is related to postnasal dripping. She has clear postnasal dripping for a while.  Review of Systems  Other than above, a 14 point review of systems is negative   Past Medical History:  Diagnosis Date  . Allergic rhinitis   . Allergy   . Arthritis   . Dry eye syndrome   . Heart murmur   . Hyperlipemia   . Hypertension   . Osteopenia    per pt had a DEXA few years ago  . Shingles 3/10    Past Surgical History:  Procedure Laterality Date  . ABDOMINAL HYSTERECTOMY     B oophorectomy- remotely  . BREAST EXCISIONAL BIOPSY Right     Social History   Socioeconomic History  . Marital status: Single    Spouse name: Not on file  . Number of children: 1  . Years of education: Not on file  . Highest education level: Not on file  Occupational History  . Occupation: retired     Fish farm manager: RETIRED    Comment: Lawyer  Social Needs  . Financial resource strain: Not on file  . Food insecurity    Worry: Not on file    Inability: Not on file  . Transportation needs    Medical: Not on file    Non-medical: Not on file  Tobacco Use  . Smoking status: Former Smoker    Types: Cigarettes  . Smokeless tobacco: Never Used  . Tobacco comment: quit 2011, 1/2 ppd , age of onset? smoked off-on  Substance and Sexual Activity  . Alcohol use: No    Alcohol/week: 0.0 standard drinks  . Drug use: No  . Sexual activity: Not on file  Lifestyle  . Physical activity    Days per week: Not on file    Minutes per session: Not on file  . Stress: Not on file  Relationships  . Social Herbalist on phone: Not on file    Gets together: Not on file    Attends religious service: Not on file    Active member of club or organization:  Not on file    Attends meetings of clubs or organizations: Not on file    Relationship status: Not on file  . Intimate partner violence    Fear of current or ex partner: Not on file    Emotionally abused: Not on file    Physically abused: Not on file    Forced sexual activity: Not on file  Other Topics Concern  . Not on file  Social History Narrative   Divorced, retired Air cabin crew    Daughter in [place] to [place] from [place] 11/09               Family History  Problem Relation Age of Onset  . Coronary artery disease Mother        onset?  Marland Kitchen Hypertension Mother   . Non-Hodgkin's lymphoma Mother        ? due to Lipitor  . Hypertension Brother   . Diabetes Neg Hx   . Stroke Neg Hx   . Colon cancer Neg Hx   . Breast cancer Neg Hx   . Rectal cancer  Neg Hx   . Stomach cancer Neg Hx      Allergies as of 02/09/2019   No Known Allergies     Medication List       Accurate as of February 09, 2019  1:43 PM. If you have any questions, ask your nurse or doctor.        amLODipine 10 MG tablet Commonly known as: NORVASC Take 1 tablet (10 mg total) by mouth daily.   aspirin 81 MG tablet Take 81 mg by mouth daily.   azelastine 0.1 % nasal spray Commonly known as: ASTELIN Place 2 sprays into both nostrils at bedtime as needed for rhinitis. Use in each nostril as directed   CALTRATE 600+D PO Take by mouth as directed.   chlorthalidone 25 MG tablet Commonly known as: HYGROTON Take 1 tablet (25 mg total) by mouth daily.   fluticasone 50 MCG/ACT nasal spray Commonly known as: FLONASE Place 2 sprays into both nostrils daily as needed for allergies or rhinitis.   lisinopril 20 MG tablet Commonly known as: ZESTRIL Take 1 tablet (20 mg total) by mouth daily.   metoprolol tartrate 25 MG tablet Commonly known as: LOPRESSOR Take 0.5 tablets (12.5 mg total) by mouth daily.   Multivitamins Chew Chew 1 tablet by mouth daily.   potassium chloride SA 20 MEQ tablet Commonly  known as: K-DUR Take 1 tablet (20 mEq total) by mouth daily.   pravastatin 40 MG tablet Commonly known as: PRAVACHOL Take 1 tablet (40 mg total) by mouth at bedtime.   spironolactone 25 MG tablet Commonly known as: ALDACTONE Take 0.5 tablets (12.5 mg total) by mouth daily.           Objective:   Physical Exam BP (!) 145/75 (BP Location: Left Arm, Patient Position: Sitting, Cuff Size: Small)   Pulse 84   Temp 98.1 F (36.7 C) (Oral)   Resp 16   Ht 5\' 6"  (1.676 m)   Wt 149 lb 2 oz (67.6 kg)   SpO2 100%   BMI 24.07 kg/m  General: Well developed, NAD, BMI noted Neck: No  thyromegaly  HEENT:  Normocephalic . Face symmetric, atraumatic Nose: Mildly congested. Sinuses no TTP Lungs:  CTA B Normal respiratory effort, no intercostal retractions, no accessory muscle use. Heart: RRR, soft systolic murmur?Marland Kitchen  No pretibial edema bilaterally  Abdomen:  Not distended, soft, non-tender. No rebound or rigidity.  Aorta is palpable at the upper abdomen, nontender, no bruit Skin: Exposed areas without rash. Not pale. Not jaundice Neurologic:  alert & oriented X3.  Speech normal, gait appropriate for age and unassisted Strength symmetric and appropriate for age.  Psych: Cognition and judgment appear intact.  Cooperative with normal attention span and concentration.  Behavior appropriate. No anxious or depressed appearing.     Assessment    Assessment  Hyperglycemia: A1C 6.0  (05-2016) HTN Hyperlipidemia Osteopenia : 02-2010: T score -2.4;  Tscore -1.1 2015: Tscore -2.0 Allergic rhinitis Shingles ~2010 Dry eye syndrome Palpable abdominal aorta: US abdomen  12-2015: no AAA  PLAN: Hyperglycemia: Last A1c satisfactory, recheck 1 return to the office HTN:  currently on amlodipine, chlorthalidone, lisinopril, potassium, metoprolol, Aldactone BP today slightly elevated but at home is satisfactory. She has ongoing cough, it could be allergies but also could be related to  lisinopril. Plan: Switch lisinopril 20 mg to losartan 50 mg and check labs in 2 weeks.  Monitor BPs. Cough: See above, recommend consistent use of Astelin and Flonase. Hyperlipidemia: Not well controlled, declined  to switch from Pravachol to Lipitor.  Checking labs. Heart murmur: First noticed it today, EKG today: NSR, LVH, no acute changes  Schedule echo Plan: Labs in 2 weeks RTC 4 months   Today, I spent more than  20  min with the patient (in addition to her physical exam) >50% of the time counseling regards chronic medical issues including hyperglycemia, HTN, also addressing cough on new heart murmur.

## 2019-02-09 NOTE — Progress Notes (Signed)
Pre visit review using our clinic review tool, if applicable. No additional management support is needed unless otherwise documented below in the visit note. 

## 2019-02-09 NOTE — Assessment & Plan Note (Addendum)
--  Td 11/2008, declined booster; PNM and shingles : declined again; agreed to take a flu shot when available  --No further paps, see previous entries. She does sees gyn -- [facility] 02/2018, next 1 year  --[facility]: colonoscopy per patient aprox 2004,Cscope again 5-10; Cscope 6/ 2020 , next per GI   --diet: not very good, has gained weight , counseled  --labs: BMP, FLP, CBC, TSH

## 2019-02-09 NOTE — Patient Instructions (Addendum)
   GO TO THE FRONT DESK Schedule labs to be done in 2 weeks, fasting  Schedule your next appointment   for checkup in 4 months.  Get a flu shot in September or October  Stop lisinopril, start losartan 50 mg 1 tablet daily  Check the  blood pressure 2 or 3 times a week Be sure your blood pressure is between 110/65 and  135/85. If it is consistently higher or lower, let me know  Use the nose sprays: Flonase daily Astelin twice a day

## 2019-02-11 NOTE — Assessment & Plan Note (Signed)
Hyperglycemia: Last A1c satisfactory, recheck 1 return to the office HTN:  currently on amlodipine, chlorthalidone, lisinopril, potassium, metoprolol, Aldactone BP today slightly elevated but at home is satisfactory. She has ongoing cough, it could be allergies but also could be related to lisinopril. Plan: Switch lisinopril 20 mg to losartan 50 mg and check labs in 2 weeks.  Monitor BPs. Cough: See above, recommend consistent use of Astelin and Flonase. Hyperlipidemia: Not well controlled, declined to switch from Pravachol to Lipitor.  Checking labs. Heart murmur: First noticed it today, EKG today: NSR, LVH, no acute changes  Schedule echo Plan: Labs in 2 weeks RTC 4 months

## 2019-02-20 ENCOUNTER — Ambulatory Visit (HOSPITAL_COMMUNITY): Payer: Medicare Other | Attending: Cardiology

## 2019-02-20 ENCOUNTER — Other Ambulatory Visit: Payer: Self-pay

## 2019-02-20 DIAGNOSIS — R011 Cardiac murmur, unspecified: Secondary | ICD-10-CM | POA: Diagnosis present

## 2019-02-23 ENCOUNTER — Other Ambulatory Visit: Payer: Self-pay

## 2019-02-23 ENCOUNTER — Other Ambulatory Visit (INDEPENDENT_AMBULATORY_CARE_PROVIDER_SITE_OTHER): Payer: Medicare Other

## 2019-02-23 DIAGNOSIS — Z0000 Encounter for general adult medical examination without abnormal findings: Secondary | ICD-10-CM | POA: Diagnosis not present

## 2019-02-23 DIAGNOSIS — E785 Hyperlipidemia, unspecified: Secondary | ICD-10-CM

## 2019-02-23 LAB — LIPID PANEL
Cholesterol: 184 mg/dL (ref 0–200)
HDL: 53.4 mg/dL (ref >39.00)
LDL Cholesterol: 118 mg/dL — ABNORMAL HIGH (ref 0–99)
NonHDL: 130.98
Total CHOL/HDL Ratio: 3
Triglycerides: 65 mg/dL (ref 0.0–149.0)
VLDL: 13 mg/dL (ref 0.0–40.0)

## 2019-02-23 LAB — CBC WITH DIFFERENTIAL/PLATELET
Basophils Absolute: 0 10*3/uL (ref 0.0–0.1)
Basophils Relative: 1.2 % (ref 0.0–3.0)
Eosinophils Absolute: 0.2 10*3/uL (ref 0.0–0.7)
Eosinophils Relative: 6.2 % — ABNORMAL HIGH (ref 0.0–5.0)
HCT: 40 % (ref 36.0–46.0)
Hemoglobin: 13.2 g/dL (ref 12.0–15.0)
Lymphocytes Relative: 27.2 % (ref 12.0–46.0)
Lymphs Abs: 0.9 10*3/uL (ref 0.7–4.0)
MCHC: 33 g/dL (ref 30.0–36.0)
MCV: 87.8 fl (ref 78.0–100.0)
Monocytes Absolute: 0.4 10*3/uL (ref 0.1–1.0)
Monocytes Relative: 10.4 % (ref 3.0–12.0)
Neutro Abs: 1.9 10*3/uL (ref 1.4–7.7)
Neutrophils Relative %: 55 % (ref 43.0–77.0)
Platelets: 271 10*3/uL (ref 150.0–400.0)
RBC: 4.56 Mil/uL (ref 3.87–5.11)
RDW: 13.4 % (ref 11.5–15.5)
WBC: 3.4 10*3/uL — ABNORMAL LOW (ref 4.0–10.5)

## 2019-02-23 LAB — BASIC METABOLIC PANEL
BUN: 14 mg/dL (ref 6–23)
CO2: 28 mEq/L (ref 19–32)
Calcium: 9.8 mg/dL (ref 8.4–10.5)
Chloride: 103 mEq/L (ref 96–112)
Creatinine, Ser: 0.76 mg/dL (ref 0.40–1.20)
GFR: 74.6 mL/min (ref >60.00)
Glucose, Bld: 93 mg/dL (ref 70–99)
Potassium: 4 mEq/L (ref 3.5–5.1)
Sodium: 139 mEq/L (ref 135–145)

## 2019-02-23 LAB — TSH: TSH: 1.05 u[IU]/mL (ref 0.35–4.50)

## 2019-04-18 NOTE — Progress Notes (Signed)
Cardiology Office Note   Date:  04/19/2019   ID:  Katherine Koch, DOB Jan 05, 1946, MRN [id number]  PCP:  Colon Branch, MD  Cardiologist:   Skeet Latch, MD   No chief complaint on file.    History of Present Illness: Katherine Koch is a 73 y.o. female with resistent hypertension, hyperlipidemia, and severe LVH who presents for follow up.  Katherine Koch was previously a patient of Katherine Koch.  She was last seen 06/2016.  She had an echo 03/2014 that revealed LVEF 60-65% with grade 2 diastolic dysfunction.  She had severe left ventricular hypertrophy which was thought to be due to poorly controlled hypertension.  Her PCP recommended that she restart a statinbut was hesitant to start atorvastatin because she thinks this caused her mother to develop lymphoma.  She also is resistant to rosuvastatin as she is thinks this caused her to have yeast infections.  She did consent to restarting pravastatin, though this has not been effective at getting her to goal in the past.  Since her last appointment Katherine Koch has been doing well.  She is currently in the process of moving.  She has been lifting a lot of boxes and has some musculoskeletal discomfort from that.  However she denies any exertional chest pain or shortness of breath.  She does occasionally have some tightness in her chest, but this never occurs with exertion.  She has been feeling more stressed and tired lately.  She has not yet taken her blood pressure medication.  When she takes her medicine her blood pressure is in the 110s over 60s to 70s.  She has some swelling in her knees occasionally but no lower extremity edema, orthopnea, or PND.  She had a cough on lisinopril that has improved since she started losartan.   Past Medical History:  Diagnosis Date  . Allergic rhinitis   . Allergy   . Arthritis   . Dry eye syndrome   . Heart murmur   . Hyperlipemia   . Hypertension   . Osteopenia    per pt had a DEXA few years ago  . Shingles  3/10    Past Surgical History:  Procedure Laterality Date  . ABDOMINAL HYSTERECTOMY     B oophorectomy- remotely  . BREAST EXCISIONAL BIOPSY Right      Current Outpatient Medications  Medication Sig Dispense Refill  . amLODipine (NORVASC) 10 MG tablet Take 1 tablet (10 mg total) by mouth daily. 90 tablet 1  . aspirin 81 MG tablet Take 81 mg by mouth daily.      . Calcium Carbonate-Vitamin D (CALTRATE 600+D PO) Take by mouth as directed.     . chlorthalidone (HYGROTON) 25 MG tablet Take 1 tablet (25 mg total) by mouth daily. 90 tablet 3  . fluticasone (FLONASE) 50 MCG/ACT nasal spray Place 2 sprays into both nostrils daily as needed for allergies or rhinitis. 16 g 5  . losartan (COZAAR) 50 MG tablet Take 1 tablet (50 mg total) by mouth daily. 90 tablet 1  . metoprolol tartrate (LOPRESSOR) 25 MG tablet Take 0.5 tablets (12.5 mg total) by mouth daily. 45 tablet 3  . Multiple Vitamins-Minerals (MULTIVITAMINS) CHEW Chew 1 tablet by mouth daily.     . potassium chloride SA (K-DUR,KLOR-CON) 20 MEQ tablet Take 1 tablet (20 mEq total) by mouth daily. 90 tablet 0  . pravastatin (PRAVACHOL) 40 MG tablet Take 1 tablet (40 mg total) by mouth at bedtime. 90 tablet 2  .  spironolactone (ALDACTONE) 25 MG tablet Take 0.5 tablets (12.5 mg total) by mouth daily. 45 tablet 3  . azelastine (ASTELIN) 0.1 % nasal spray Place 2 sprays into both nostrils at bedtime as needed for rhinitis. Use in each nostril as directed (Patient not taking: Reported on 01/23/2019) 30 mL 3   No current facility-administered medications for this visit.     Allergies:   Patient has no known allergies.    Social History:  The patient  reports that she has quit smoking. Her smoking use included cigarettes. She has never used smokeless tobacco. She reports that she does not drink alcohol or use drugs.   Family History:  The patient's family history includes Coronary artery disease in her mother; Hypertension in her brother and  mother; Non-Hodgkin's lymphoma in her mother.    ROS:  Please see the history of present illness.   Otherwise, review of systems are positive for none.   All other systems are reviewed and negative.    PHYSICAL EXAM: VS:  BP (!) 153/85   Pulse 78   Ht 5\' 6"  (1.676 m)   Wt 150 lb (68 kg)   SpO2 98%   BMI 24.21 kg/m  , BMI Body mass index is 24.21 kg/m. GENERAL:  Well appearing HEENT: Pupils equal round and reactive, fundi not visualized, oral mucosa unremarkable NECK:  No jugular venous distention, waveform within normal limits, carotid upstroke brisk and symmetric, no bruits, no thyromegaly LYMPHATICS:  No cervical adenopathy LUNGS:  Clear to auscultation bilaterally HEART:  RRR.  PMI not displaced or sustained,S1 and S2 within normal limits, no S3, no S4, no clicks, no rubs, no pain murmurs ABD:  Flat, positive bowel sounds normal in frequency in pitch, no bruits, no rebound, no guarding, no midline pulsatile mass, no hepatomegaly, no splenomegaly EXT:  2 plus pulses throughout, no edema, no cyanosis no clubbing SKIN:  No rashes no nodules NEURO:  Cranial nerves II through XII grossly intact, motor grossly intact throughout PSYCH:  Cognitively intact, oriented to person place and time   EKG:  EKG is not ordered today. The ekg ordered 06/29/17 demonstrates sinus rhythm.  Rate 65 bpm. LVH with secondary repolarization abnormalities.  05/17/2018: Sinus rhythm.  Rate 72 bpm.  LVH with secondary repolarization of normalities.   Echo 03/28/14: Study Conclusions  - Left ventricle: The cavity size was normal. Wall thickness was increased in a pattern of severe LVH. Systolic function was normal. The estimated ejection fraction was in the range of 60% to 65%. Features are consistent with a pseudonormal left ventricular filling pattern, with concomitant abnormal relaxation and increased filling pressure (grade 2 diastolic dysfunction). - Mitral valve: Calcified annulus. Mildly  thickened leaflets . There was mild regurgitation. - Left atrium: The atrium was mildly dilated.  Recent Labs: 05/26/2018: ALT 17 02/23/2019: BUN 14; Creatinine, Ser 0.76; Hemoglobin 13.2; Platelets 271.0; Potassium 4.0; Sodium 139; TSH 1.05    Lipid Panel    Component Value Date/Time   CHOL 184 02/23/2019 0907   TRIG 65.0 02/23/2019 0907   HDL 53.40 02/23/2019 0907   CHOLHDL 3 02/23/2019 0907   VLDL 13.0 02/23/2019 0907   LDLCALC 118 (H) 02/23/2019 0907   LDLDIRECT 155.8 02/15/2012 1354      Wt Readings from Last 3 Encounters:  04/19/19 150 lb (68 kg)  02/09/19 149 lb 2 oz (67.6 kg)  01/23/19 150 lb (68 kg)      ASSESSMENT AND PLAN:  # Hyperlipidemia: Lipids continue to improve.  LDL 118 02/2019.  Continue pravastatin.  # Hypertension:  Blood pressure is elevated today.  However she has not taken any medication today.  In general her blood pressure has been excellent.  Continue amlodipine, chlorthalidone, lisinopril, metoprolol, and spironolactone.  # Severe LVH: She is euvolemic and this is thought to be due to hypertensive heart disease.  Continue hypertension management as above.  No changes.   Current medicines are reviewed at length with the patient today.  The patient does not have concerns regarding medicines.  The following changes have been made:  no change  Labs/ tests ordered today include:   No orders of the defined types were placed in this encounter.    Disposition:   FU with Lorelie Biermann C. Oval Linsey, MD, [facility] in 1 year      Signed, Mckinna Demars C. Oval Linsey, MD, [facility]  04/19/2019 1:10 PM    [place] patient

## 2019-04-19 ENCOUNTER — Ambulatory Visit (INDEPENDENT_AMBULATORY_CARE_PROVIDER_SITE_OTHER): Payer: Medicare Other | Admitting: Cardiovascular Disease

## 2019-04-19 ENCOUNTER — Encounter: Payer: Self-pay | Admitting: Cardiovascular Disease

## 2019-04-19 ENCOUNTER — Other Ambulatory Visit: Payer: Self-pay

## 2019-04-19 VITALS — BP 153/85 | HR 78 | Ht 66.0 in | Wt 150.0 lb

## 2019-04-19 DIAGNOSIS — I10 Essential (primary) hypertension: Secondary | ICD-10-CM | POA: Diagnosis not present

## 2019-04-19 DIAGNOSIS — I517 Cardiomegaly: Secondary | ICD-10-CM

## 2019-04-19 DIAGNOSIS — E7800 Pure hypercholesterolemia, unspecified: Secondary | ICD-10-CM

## 2019-04-19 NOTE — Patient Instructions (Signed)
Medication Instructions:  NONE If you need a refill on your cardiac medications before your next appointment, please call your pharmacy.   Lab work: NONE   Testing/Procedures: NONE  Follow-Up: At [facility], you and your health needs are our priority.  As part of our continuing mission to provide you with exceptional heart care, we have created designated Provider Care Teams.  These Care Teams include your primary Cardiologist (physician) and Advanced Practice Providers (APPs -  Physician Assistants and Nurse Practitioners) who all work together to provide you with the care you need, when you need it. You will need a follow up appointment in 12 months.  Please call our office 2 months in advance to schedule this appointment.  You may see DR [facility]   or one of the following Advanced Practice Providers on your designated Care Team:   Kerin Ransom, PA-C Roby Lofts, [place] . Sande Rives, PA-C

## 2019-05-08 ENCOUNTER — Other Ambulatory Visit: Payer: Self-pay | Admitting: Internal Medicine

## 2019-06-11 ENCOUNTER — Other Ambulatory Visit: Payer: Self-pay

## 2019-06-12 ENCOUNTER — Telehealth: Payer: Self-pay | Admitting: Internal Medicine

## 2019-06-12 ENCOUNTER — Ambulatory Visit: Payer: Medicare Other | Admitting: Internal Medicine

## 2019-06-12 ENCOUNTER — Encounter: Payer: Self-pay | Admitting: Internal Medicine

## 2019-06-12 VITALS — BP 136/62 | HR 64 | Temp 97.2°F | Resp 16 | Ht 66.0 in | Wt 148.2 lb

## 2019-06-12 DIAGNOSIS — I10 Essential (primary) hypertension: Secondary | ICD-10-CM | POA: Diagnosis not present

## 2019-06-12 DIAGNOSIS — Z23 Encounter for immunization: Secondary | ICD-10-CM | POA: Diagnosis not present

## 2019-06-12 DIAGNOSIS — R739 Hyperglycemia, unspecified: Secondary | ICD-10-CM

## 2019-06-12 DIAGNOSIS — E785 Hyperlipidemia, unspecified: Secondary | ICD-10-CM | POA: Diagnosis not present

## 2019-06-12 LAB — COMPREHENSIVE METABOLIC PANEL
ALT: 17 U/L (ref 0–35)
AST: 22 U/L (ref 0–37)
Albumin: 4.8 g/dL (ref 3.5–5.2)
Alkaline Phosphatase: 57 U/L (ref 39–117)
BUN: 16 mg/dL (ref 6–23)
CO2: 28 mEq/L (ref 19–32)
Calcium: 9.8 mg/dL (ref 8.4–10.5)
Chloride: 102 mEq/L (ref 96–112)
Creatinine, Ser: 0.7 mg/dL (ref 0.40–1.20)
GFR: 81.96 mL/min (ref >60.00)
Glucose, Bld: 90 mg/dL (ref 70–99)
Potassium: 3.8 mEq/L (ref 3.5–5.1)
Sodium: 138 mEq/L (ref 135–145)
Total Bilirubin: 0.7 mg/dL (ref 0.2–1.2)
Total Protein: 7.3 g/dL (ref 6.0–8.3)

## 2019-06-12 LAB — HEMOGLOBIN A1C: Hgb A1c MFr Bld: 5.9 % (ref 4.6–6.5)

## 2019-06-12 NOTE — Telephone Encounter (Signed)
Pt stated had labs done today and wants her lab results mailed to her since tel is not working. Please advise.

## 2019-06-12 NOTE — Progress Notes (Signed)
Pre visit review using our clinic review tool, if applicable. No additional management support is needed unless otherwise documented below in the visit note. 

## 2019-06-12 NOTE — Patient Instructions (Addendum)
Please schedule Medicare Wellness with Glenard Haring.   GO TO THE LAB : Get the blood work     GO TO THE FRONT DESK Schedule your next appointment for a physical exam by 01/2020    Check the  blood pressure weekly BP GOAL is between 110/65 and  135/85. If it is consistently higher or lower, let me know

## 2019-06-12 NOTE — Telephone Encounter (Signed)
Noted  

## 2019-06-12 NOTE — Progress Notes (Signed)
Subjective:    Patient ID: Katherine Koch, female    DOB: [birth year], 73 y.o.   MRN: [id number]  DOS:  06/12/2019 Type of visit - description: Follow-up Since the last office visit she is doing well.  Good compliance to medication.  Ambulatory BPs in the 120s. Saw cardiology and had an echocardiogram, report reviewed.   Review of Systems Denies fever chills Continue mild nasal congestion No chest pain no difficulty breathing Cough much improved.  Past Medical History:  Diagnosis Date  . Allergic rhinitis   . Allergy   . Arthritis   . Dry eye syndrome   . Heart murmur   . Hyperlipemia   . Hypertension   . Osteopenia    per pt had a DEXA few years ago  . Shingles 3/10    Past Surgical History:  Procedure Laterality Date  . ABDOMINAL HYSTERECTOMY     B oophorectomy- remotely  . BREAST EXCISIONAL BIOPSY Right     Social History   Socioeconomic History  . Marital status: Single    Spouse name: Not on file  . Number of children: 1  . Years of education: Not on file  . Highest education level: Not on file  Occupational History  . Occupation: retired     Fish farm manager: RETIRED    Comment: Lawyer  Social Needs  . Financial resource strain: Not on file  . Food insecurity    Worry: Not on file    Inability: Not on file  . Transportation needs    Medical: Not on file    Non-medical: Not on file  Tobacco Use  . Smoking status: Former Smoker    Types: Cigarettes  . Smokeless tobacco: Never Used  . Tobacco comment: quit 2011, 1/2 ppd , age of onset? smoked off-on  Substance and Sexual Activity  . Alcohol use: No    Alcohol/week: 0.0 standard drinks  . Drug use: No  . Sexual activity: Not on file  Lifestyle  . Physical activity    Days per week: Not on file    Minutes per session: Not on file  . Stress: Not on file  Relationships  . Social Herbalist on phone: Not on file    Gets together: Not on file    Attends religious service: Not on  file    Active member of club or organization: Not on file    Attends meetings of clubs or organizations: Not on file    Relationship status: Not on file  . Intimate partner violence    Fear of current or ex partner: Not on file    Emotionally abused: Not on file    Physically abused: Not on file    Forced sexual activity: Not on file  Other Topics Concern  . Not on file  Social History Narrative   Divorced, retired Air cabin crew    Daughter in [place] to [place] from [place] 11/09                Allergies as of 06/12/2019   No Known Allergies     Medication List       Accurate as of June 12, 2019 11:59 PM. If you have any questions, ask your nurse or doctor.        amLODipine 10 MG tablet Commonly known as: NORVASC TAKE 1 TABLET(10 MG) BY MOUTH DAILY   aspirin 81 MG tablet Take 81 mg by mouth daily.   azelastine 0.1 %  nasal spray Commonly known as: ASTELIN Place 2 sprays into both nostrils at bedtime as needed for rhinitis. Use in each nostril as directed   CALTRATE 600+D PO Take by mouth as directed.   chlorthalidone 25 MG tablet Commonly known as: HYGROTON Take 1 tablet (25 mg total) by mouth daily.   fluticasone 50 MCG/ACT nasal spray Commonly known as: FLONASE Place 2 sprays into both nostrils daily as needed for allergies or rhinitis.   losartan 50 MG tablet Commonly known as: COZAAR Take 1 tablet (50 mg total) by mouth daily.   metoprolol tartrate 25 MG tablet Commonly known as: LOPRESSOR Take 0.5 tablets (12.5 mg total) by mouth daily.   Multivitamins Chew Chew 1 tablet by mouth daily.   potassium chloride SA 20 MEQ tablet Commonly known as: KLOR-CON Take 1 tablet (20 mEq total) by mouth daily.   pravastatin 40 MG tablet Commonly known as: PRAVACHOL Take 1 tablet (40 mg total) by mouth at bedtime.   spironolactone 25 MG tablet Commonly known as: ALDACTONE Take 0.5 tablets (12.5 mg total) by mouth daily.           Objective:    Physical Exam BP 136/62 (BP Location: Left Arm, Patient Position: Sitting, Cuff Size: Small)   Pulse 64   Temp (!) 97.2 F (36.2 C) (Temporal)   Resp 16   Ht 5\' 6"  (1.676 m)   Wt 148 lb 4 oz (67.2 kg)   SpO2 100%   BMI 23.93 kg/m  General:   Well developed, NAD, BMI noted. HEENT:  Normocephalic . Face symmetric, atraumatic Lungs:  CTA B Normal respiratory effort, no intercostal retractions, no accessory muscle use. Heart: RRR,  no murmur.  No pretibial edema bilaterally  Skin: Not pale. Not jaundice Neurologic:  alert & oriented X3.  Speech normal, gait appropriate for age and unassisted Psych--  Cognition and judgment appear intact.  Cooperative with normal attention span and concentration.  Behavior appropriate. No anxious or depressed appearing.      Assessment     Assessment  Hyperglycemia: A1C 6.0  (05-2016) HTN LVH per echo 2015: Hyperlipidemia Osteopenia : 02-2010: T score -2.4;  Tscore -1.1 2015: Tscore -2.0 Allergic rhinitis Shingles ~2010 Dry eye syndrome Palpable abdominal aorta: US abdomen  12-2015: no AAA  PLAN: Hyperglycemia: Diet controlled, check A1c HTN: On amlodipine, chlorthalidone, losartan, metoprolol, potassium, Aldactone.  Seems well controlled.  Check a CMP High cholesterol: declined to  switch to Lipitor before; on Pravachol H/o Severe LVH: Saw cardiology 04-2019, felt to be stable. Heart murmur?: Echo 02-2019 valves essentially normal. Flu shot today RTC 01/2020 BX

## 2019-06-13 NOTE — Assessment & Plan Note (Signed)
Hyperglycemia: Diet controlled, check A1c HTN: On amlodipine, chlorthalidone, losartan, metoprolol, potassium, Aldactone.  Seems well controlled.  Check a CMP High cholesterol: declined to  switch to Lipitor before; on Pravachol H/o Severe LVH: Saw cardiology 04-2019, felt to be stable. Heart murmur?: Echo 02-2019 valves essentially normal. Flu shot today RTC 01/2020 BX

## 2019-07-04 ENCOUNTER — Other Ambulatory Visit: Payer: Self-pay | Admitting: Internal Medicine

## 2019-09-20 ENCOUNTER — Other Ambulatory Visit: Payer: Self-pay

## 2019-09-20 MED ORDER — LOSARTAN POTASSIUM 50 MG PO TABS: 50.0000 mg | ORAL_TABLET | Freq: Every day | ORAL | 1 refills | Status: DC

## 2019-10-11 ENCOUNTER — Ambulatory Visit: Payer: Medicare Other | Attending: Internal Medicine

## 2019-10-11 DIAGNOSIS — Z23 Encounter for immunization: Secondary | ICD-10-CM | POA: Insufficient documentation

## 2019-10-11 NOTE — Progress Notes (Signed)
   Covid-19 Vaccination Clinic  Name:  Katherine Koch    MRN: [id number] DOB: 1945/11/23  10/11/2019  [name] was observed post Covid-19 immunization for 15 minutes without incidence. She was provided with Vaccine Information Sheet and instruction to access the V-Safe system.   [name] was instructed to call 911 with any severe reactions post vaccine: Marland Kitchen Difficulty breathing  . Swelling of your face and throat  . A fast heartbeat  . A bad rash all over your body  . Dizziness and weakness    Immunizations Administered    Name Date Dose VIS Date Route   Pfizer COVID-19 Vaccine 10/11/2019 11:34 AM 0.3 mL 07/27/2019 Intramuscular   Manufacturer: [place]   Lot: [id number]   [place]: [id number]

## 2019-10-15 ENCOUNTER — Encounter: Payer: Self-pay | Admitting: Internal Medicine

## 2019-10-15 ENCOUNTER — Other Ambulatory Visit: Payer: Self-pay

## 2019-10-15 ENCOUNTER — Ambulatory Visit (INDEPENDENT_AMBULATORY_CARE_PROVIDER_SITE_OTHER): Payer: Medicare Other | Admitting: Internal Medicine

## 2019-10-15 VITALS — Ht 66.0 in | Wt 148.0 lb

## 2019-10-15 DIAGNOSIS — T50Z95A Adverse effect of other vaccines and biological substances, initial encounter: Secondary | ICD-10-CM

## 2019-10-15 DIAGNOSIS — J302 Other seasonal allergic rhinitis: Secondary | ICD-10-CM | POA: Diagnosis not present

## 2019-10-15 MED ORDER — AZELASTINE HCL 0.1 % NA SOLN: 2.0000 | Freq: Two times a day (BID) | NASAL | 3 refills | Status: DC

## 2019-10-15 MED ORDER — MOMETASONE FUROATE 50 MCG/ACT NA SUSP: 2.0000 | Freq: Every day | NASAL | 12 refills | Status: DC

## 2019-10-15 NOTE — Progress Notes (Signed)
Subjective:    Patient ID: Katherine Koch, female    DOB: [birth year], 74 y.o.   MRN: [id number]  DOS:  10/15/2019 Type of visit - description: Virtual Visit via Telephone  Attempted  to make this a video visit, due to technical difficulties from the patient side it was not possible  thus we proceeded with a Virtual Visit via Telephone    I connected with above mentioned patient  by telephone and verified that I am speaking with the correct person using two identifiers.  THIS ENCOUNTER IS A VIRTUAL VISIT DUE TO COVID-19 - PATIENT WAS NOT SEEN IN THE OFFICE. PATIENT HAS CONSENTED TO VIRTUAL VISIT / TELEMEDICINE VISIT   Location of patient: home  Location of provider: office  I discussed the limitations, risks, security and privacy concerns of performing an evaluation and management service by telephone and the availability of in person appointments. I also discussed with the patient that there may be a patient responsible charge related to this service. The patient expressed understanding and agreed to proceed.  Acute  Several months history of nasal discharge, increased for the last 6 to 8 weeks: Persistent clear/white discharge, when he is bad enough it creates cough but she reports no chest congestion, wheezing.  Also 4 days ago had her first Covid vaccination, the next day he developed itching redness at the site of injection but denies fever chills or generalized rash.    Review of Systems Denies sinus pain or pressure Itchy nose but no itchy eyes No chest pain, difficulty breathing.  No wheezing  Past Medical History:  Diagnosis Date  . Allergic rhinitis   . Allergy   . Arthritis   . Dry eye syndrome   . Heart murmur   . Hyperlipemia   . Hypertension   . Osteopenia    per pt had a DEXA few years ago  . Shingles 3/10    Past Surgical History:  Procedure Laterality Date  . ABDOMINAL HYSTERECTOMY     B oophorectomy- remotely  . BREAST EXCISIONAL BIOPSY Right      Allergies as of 10/15/2019   No Known Allergies     Medication List       Accurate as of October 15, 2019 11:59 PM. If you have any questions, ask your nurse or doctor.        STOP taking these medications   fluticasone 50 MCG/ACT nasal spray Commonly known as: FLONASE Stopped by: Kathlene November, MD     TAKE these medications   amLODipine 10 MG tablet Commonly known as: NORVASC TAKE 1 TABLET(10 MG) BY MOUTH DAILY   aspirin 81 MG tablet Take 81 mg by mouth daily.   azelastine 0.1 % nasal spray Commonly known as: ASTELIN Place 2 sprays into both nostrils 2 (two) times daily. Use in each nostril as directed What changed:   when to take this  reasons to take this Changed by: Kathlene November, MD   CALTRATE 600+D PO Take by mouth as directed.   chlorthalidone 25 MG tablet Commonly known as: HYGROTON Take 1 tablet (25 mg total) by mouth daily.   losartan 50 MG tablet Commonly known as: COZAAR Take 1 tablet (50 mg total) by mouth daily.   metoprolol tartrate 25 MG tablet Commonly known as: LOPRESSOR Take 0.5 tablets (12.5 mg total) by mouth daily.   mometasone 50 MCG/ACT nasal spray Commonly known as: Nasonex Place 2 sprays into the nose daily. Started by: Kathlene November, MD   Multivitamins Sarina Ser  Chew 1 tablet by mouth daily.   potassium chloride SA 20 MEQ tablet Commonly known as: KLOR-CON Take 1 tablet (20 mEq total) by mouth daily.   pravastatin 40 MG tablet Commonly known as: PRAVACHOL Take 1 tablet (40 mg total) by mouth at bedtime.   spironolactone 25 MG tablet Commonly known as: ALDACTONE Take 0.5 tablets (12.5 mg total) by mouth daily.        Objective:   Physical Exam Ht 5\' 6"  (1.676 m)   Wt 148 lb (67.1 kg)   BMI 23.89 kg/m  This is a telephone virtual visit, she is alert oriented x3, in no distress.  Speaking in complete sentences    Assessment    Assessment  Hyperglycemia: A1C 6.0  (05-2016) HTN LVH per echo 2015: Hyperlipidemia Osteopenia :  02-2010: T score -2.4;  Tscore -1.1 2015: Tscore -2.0 Allergic rhinitis Shingles ~2010 Dry eye syndrome Palpable abdominal aorta: US abdomen  12-2015: no AAA  PLAN: Allergic rhinitis: Currently not taking Astelin, stopped Flonase a while back because it caused "burning" in the nose and Claritin does not seem to help. Plan: Restart Astelin twice a day, switch to Nasonex, use sprays consistently, call if not better. Local reaction to the first Covid shot: She seems to have a local reaction only to the Covid shot.  Recommend icing, OTC hydrocortisone and call if not gradually better.  As long as she does not getting worse, this reaction does not prevent her from getting the second Covid shot.   I discussed the assessment and treatment plan with the patient. The patient was provided an opportunity to ask questions and all were answered. The patient agreed with the plan and demonstrated an understanding of the instructions.   The patient was advised to call back or seek an in-person evaluation if the symptoms worsen or if the condition fails to improve as anticipated.  I provided 19 minutes of non-face-to-face time during this encounter.  Kathlene November, MD

## 2019-10-15 NOTE — Progress Notes (Signed)
Pre visit review using our clinic review tool, if applicable. No additional management support is needed unless otherwise documented below in the visit note. 

## 2019-10-16 NOTE — Assessment & Plan Note (Signed)
Allergic rhinitis: Currently not taking Astelin, stopped Flonase a while back because it caused "burning" in the nose and Claritin does not seem to help. Plan: Restart Astelin twice a day, switch to Nasonex, use sprays consistently, call if not better. Local reaction to the first Covid shot: Katherine Koch seems to have a local reaction only to the Covid shot.  Recommend icing, OTC hydrocortisone and call if not gradually better.  As long as Katherine Koch does not getting worse, this reaction does not prevent her from getting the second Covid shot.

## 2019-10-24 ENCOUNTER — Telehealth: Payer: Self-pay | Admitting: Internal Medicine

## 2019-10-24 ENCOUNTER — Other Ambulatory Visit: Payer: Self-pay

## 2019-10-24 NOTE — Telephone Encounter (Signed)
Please advise 

## 2019-10-24 NOTE — Telephone Encounter (Signed)
done

## 2019-10-24 NOTE — Telephone Encounter (Signed)
Patient states that she had the 1st Covid  Vaccine Feb 25,2021 and experienced Arm itching and Redness. Pt spoke with Dr Larose Kells and she brought over the counter medications and they help some. Patient is still itching .Patient states that she a itch that will not stop and she is do for her 2nd shot  March 25,2021.   Please Advise

## 2019-10-24 NOTE — Telephone Encounter (Signed)
Needs appt please

## 2019-10-24 NOTE — Telephone Encounter (Signed)
Please arrange a in person visit this week.

## 2019-10-25 ENCOUNTER — Telehealth: Payer: Self-pay | Admitting: Internal Medicine

## 2019-10-25 ENCOUNTER — Ambulatory Visit: Payer: Medicare Other | Admitting: Internal Medicine

## 2019-10-25 VITALS — BP 133/64 | HR 82 | Temp 96.7°F | Resp 12 | Wt 147.8 lb

## 2019-10-25 DIAGNOSIS — T50Z95A Adverse effect of other vaccines and biological substances, initial encounter: Secondary | ICD-10-CM

## 2019-10-25 DIAGNOSIS — Z09 Encounter for follow-up examination after completed treatment for conditions other than malignant neoplasm: Secondary | ICD-10-CM

## 2019-10-25 NOTE — Telephone Encounter (Signed)
Left message to return call. Katherine Koch tried to reach patient earlier as well.

## 2019-10-25 NOTE — Telephone Encounter (Signed)
Left message on machine to call back  

## 2019-10-25 NOTE — Telephone Encounter (Signed)
Advise patient, it is okay to proceed with her second Covid vaccination as a scheduled. She just need to take: Benadryl 25 mg : TID  for 4 to 5 days.  Start 2 hours before the shot.  Watch for drowsiness Pepcid 20 mg OTC: 1 tab  BID for 4 to 5 days.  Start 2 hours before the shot. If she developed problems let me know

## 2019-10-25 NOTE — Progress Notes (Signed)
Subjective:    Patient ID: Katherine Koch, female    DOB: Dec 31, 1945, 74 y.o.   MRN: [id number]  DOS:  10/25/2019 Type of visit - description: Acute The patient got her first [place] vaccination 10/11/2019, left shoulder. The next day the deltoid area was red and itchy and slightly warm. Since then, the redness spread to the inner aspect of the left arm & left chest and she developed generalized itching, mostly at  her abdomen and buttocks. There was no redness other than at the arm and chest. Never had whelps or hives. No lip or tongue swelling No cough, wheezing or shortness of breath.  Symptoms responded quickly to Benadryl and there now "100% better".    Review of Systems See above   Past Medical History:  Diagnosis Date  . Allergic rhinitis   . Allergy   . Arthritis   . Dry eye syndrome   . Heart murmur   . Hyperlipemia   . Hypertension   . Osteopenia    per pt had a DEXA few years ago  . Shingles 3/10    Past Surgical History:  Procedure Laterality Date  . ABDOMINAL HYSTERECTOMY     B oophorectomy- remotely  . BREAST EXCISIONAL BIOPSY Right     Allergies as of 10/25/2019   No Known Allergies     Medication List       Accurate as of October 25, 2019  9:31 AM. If you have any questions, ask your nurse or doctor.        amLODipine 10 MG tablet Commonly known as: NORVASC TAKE 1 TABLET(10 MG) BY MOUTH DAILY   aspirin 81 MG tablet Take 81 mg by mouth daily.   azelastine 0.1 % nasal spray Commonly known as: ASTELIN Place 2 sprays into both nostrils 2 (two) times daily. Use in each nostril as directed   CALTRATE 600+D PO Take by mouth as directed.   chlorthalidone 25 MG tablet Commonly known as: HYGROTON Take 1 tablet (25 mg total) by mouth daily.   losartan 50 MG tablet Commonly known as: COZAAR Take 1 tablet (50 mg total) by mouth daily.   metoprolol tartrate 25 MG tablet Commonly known as: LOPRESSOR Take 0.5 tablets (12.5 mg total) by mouth  daily.   mometasone 50 MCG/ACT nasal spray Commonly known as: Nasonex Place 2 sprays into the nose daily.   Multivitamins Chew Chew 1 tablet by mouth daily.   potassium chloride SA 20 MEQ tablet Commonly known as: KLOR-CON Take 1 tablet (20 mEq total) by mouth daily.   pravastatin 40 MG tablet Commonly known as: PRAVACHOL Take 1 tablet (40 mg total) by mouth at bedtime.   spironolactone 25 MG tablet Commonly known as: ALDACTONE Take 0.5 tablets (12.5 mg total) by mouth daily.          Objective:   Physical Exam BP 133/64 (BP Location: Left Arm, Cuff Size: Normal)   Pulse 82   Temp (!) 96.7 F (35.9 C) (Temporal)   Resp 12   Wt 147 lb 12.8 oz (67 kg)   SpO2 100%   BMI 23.86 kg/m  General:   Well developed, NAD, BMI noted. HEENT:  Normocephalic . Face symmetric, atraumatic  Skin: Not pale. Not jaundice.  No rash noted on the arms, shoulders, chest, abdomen, back. Neurologic:  alert & oriented X3.  Speech normal, gait appropriate for age and unassisted Psych--  Cognition and judgment appear intact.  Cooperative with normal attention span and concentration.  Behavior  appropriate. No anxious or depressed appearing.      Assessment     Assessment  Hyperglycemia: A1C 6.0  (05-2016) HTN LVH per echo 2015: Hyperlipidemia Osteopenia : 02-2010: T score -2.4;  Tscore -1.1 2015: Tscore -2.0 Allergic rhinitis Shingles ~2010 Dry eye syndrome Palpable abdominal aorta: US abdomen  12-2015: no AAA  PLAN: Vaccination reaction: Had the [place] vaccination #1 on 10/11/2019, developed local swelling and redness at the left shoulder, it extended somehow to the left inner arm and chest.  Also itching at the abdomen back and buttocks. Wonders if it is safe to take the second vaccination. Addendum: Discussed with ID via message, appreciate his help, okay to proceed with a second shot premedicating with Benadryl, Pepcid.  Will let the patient know Allergic rhinitis: See  last visit, was recommended to restart Astelin which he did, symptoms better.  Not taking nasal steroids.   This visit occurred during the SARS-CoV-2 public health emergency.  Safety protocols were in place, including screening questions prior to the visit, additional usage of staff PPE, and extensive cleaning of exam room while observing appropriate contact time as indicated for disinfecting solutions.

## 2019-10-26 NOTE — Telephone Encounter (Signed)
Patient wanted to know why she is being given an antiacid.  Patient notified that pepcid works as an antihistamine as well.

## 2019-10-26 NOTE — Telephone Encounter (Signed)
Pt returned call do to a concern : Question : Why should I take Pepcid 20 mg OTC: 1 tab  BID for 4 to 5 days.  Start 2 hours before the shot. Before Covid Vaccine

## 2019-10-26 NOTE — Assessment & Plan Note (Signed)
Vaccination reaction: Had the [place] vaccination #1 on 10/11/2019, developed local swelling and redness at the left shoulder, it extended somehow to the left inner arm and chest.  Also itching at the abdomen back and buttocks. Wonders if it is safe to take the second vaccination. Addendum: Discussed with ID via message, appreciate his help, okay to proceed with a second shot premedicating with Benadryl, Pepcid.  Will let the patient know Allergic rhinitis: See last visit, was recommended to restart Astelin which he did, symptoms better.  Not taking nasal steroids.

## 2019-10-26 NOTE — Telephone Encounter (Signed)
Patient returning call. Gave her Paz's recommendation for meds before shot.. But if You could call her back she would greatly appreciate it.

## 2019-11-06 ENCOUNTER — Ambulatory Visit: Payer: Medicare Other | Attending: Internal Medicine

## 2019-11-06 DIAGNOSIS — Z23 Encounter for immunization: Secondary | ICD-10-CM

## 2019-11-06 NOTE — Progress Notes (Signed)
   Covid-19 Vaccination Clinic  Name:  Katherine Koch    MRN: [id number] DOB: 10/26/1945  11/06/2019  Katherine Koch was observed post Covid-19 immunization for 15 minutes without incident. She was provided with Vaccine Information Sheet and instruction to access the V-Safe system.   Katherine Koch was instructed to call 911 with any severe reactions post vaccine: Marland Kitchen Difficulty breathing  . Swelling of face and throat  . A fast heartbeat  . A bad rash all over body  . Dizziness and weakness   Immunizations Administered    Name Date Dose VIS Date Route   Pfizer COVID-19 Vaccine 11/06/2019 11:16 AM 0.3 mL 07/27/2019 Intramuscular   Manufacturer: [place]   Lot: [id number]   [place]: [id number]

## 2019-11-30 ENCOUNTER — Other Ambulatory Visit: Payer: Self-pay | Admitting: Cardiovascular Disease

## 2019-12-03 ENCOUNTER — Other Ambulatory Visit: Payer: Self-pay | Admitting: Cardiovascular Disease

## 2019-12-03 NOTE — Telephone Encounter (Signed)
  *  STAT* If patient is at the pharmacy, call can be transferred to refill team.   1. Which medications need to be refilled? (please list name of each medication and dose if known)   spironolactone (ALDACTONE) 25 MG tablet     2. Which pharmacy/location (including street and city if local pharmacy) is medication to be sent to? [id number] - [place], [place] - [address]  3. Do they need a 30 day or 90 day supply? 30 days

## 2019-12-04 ENCOUNTER — Other Ambulatory Visit: Payer: Self-pay | Admitting: Cardiovascular Disease

## 2019-12-04 MED ORDER — SPIRONOLACTONE 25 MG PO TABS: 12.5000 mg | ORAL_TABLET | Freq: Every day | ORAL | 1 refills | Status: DC

## 2019-12-05 ENCOUNTER — Other Ambulatory Visit: Payer: Self-pay

## 2019-12-05 MED ORDER — SPIRONOLACTONE 25 MG PO TABS: 12.5000 mg | ORAL_TABLET | Freq: Every day | ORAL | 3 refills | Status: DC

## 2019-12-10 ENCOUNTER — Other Ambulatory Visit: Payer: Self-pay | Admitting: Internal Medicine

## 2019-12-10 DIAGNOSIS — Z1231 Encounter for screening mammogram for malignant neoplasm of breast: Secondary | ICD-10-CM

## 2019-12-12 ENCOUNTER — Telehealth: Payer: Self-pay

## 2019-12-12 MED ORDER — AMLODIPINE BESYLATE 10 MG PO TABS: 10.0000 mg | ORAL_TABLET | Freq: Every day | ORAL | 1 refills | Status: DC

## 2019-12-12 NOTE — Telephone Encounter (Signed)
Patient called in to see if [name] can call in a prescription for  amLODipine (NORVASC) 10 MG tablet [id number]    Please send it to [id number] - [place], [place] - [address]  [address] [place], [place] [address]  Phone:  [phone] Fax:  [phone]  DEA #:  [id number]

## 2019-12-12 NOTE — Telephone Encounter (Signed)
Rx sent 

## 2019-12-25 ENCOUNTER — Other Ambulatory Visit: Payer: Self-pay

## 2019-12-25 DIAGNOSIS — I10 Essential (primary) hypertension: Secondary | ICD-10-CM

## 2019-12-25 MED ORDER — POTASSIUM CHLORIDE CRYS ER 20 MEQ PO TBCR: 20.0000 meq | EXTENDED_RELEASE_TABLET | Freq: Every day | ORAL | 1 refills | Status: DC

## 2020-02-12 ENCOUNTER — Other Ambulatory Visit: Payer: Self-pay

## 2020-02-12 ENCOUNTER — Encounter: Payer: Self-pay | Admitting: Internal Medicine

## 2020-02-12 ENCOUNTER — Ambulatory Visit (INDEPENDENT_AMBULATORY_CARE_PROVIDER_SITE_OTHER): Payer: Medicare Other | Admitting: Internal Medicine

## 2020-02-12 VITALS — BP 134/78 | HR 69 | Temp 95.6°F | Resp 18 | Ht 66.0 in | Wt 148.2 lb

## 2020-02-12 DIAGNOSIS — E785 Hyperlipidemia, unspecified: Secondary | ICD-10-CM

## 2020-02-12 DIAGNOSIS — M8580 Other specified disorders of bone density and structure, unspecified site: Secondary | ICD-10-CM | POA: Diagnosis not present

## 2020-02-12 DIAGNOSIS — Z1231 Encounter for screening mammogram for malignant neoplasm of breast: Secondary | ICD-10-CM

## 2020-02-12 DIAGNOSIS — Z780 Asymptomatic menopausal state: Secondary | ICD-10-CM | POA: Diagnosis not present

## 2020-02-12 DIAGNOSIS — Z0000 Encounter for general adult medical examination without abnormal findings: Secondary | ICD-10-CM

## 2020-02-12 DIAGNOSIS — R739 Hyperglycemia, unspecified: Secondary | ICD-10-CM

## 2020-02-12 LAB — CBC WITH DIFFERENTIAL/PLATELET
Basophils Absolute: 0 10*3/uL (ref 0.0–0.1)
Basophils Relative: 0.3 % (ref 0.0–3.0)
Eosinophils Absolute: 0.2 10*3/uL (ref 0.0–0.7)
Eosinophils Relative: 4.7 % (ref 0.0–5.0)
HCT: 41.7 % (ref 36.0–46.0)
Hemoglobin: 13.7 g/dL (ref 12.0–15.0)
Lymphocytes Relative: 26.8 % (ref 12.0–46.0)
Lymphs Abs: 0.9 10*3/uL (ref 0.7–4.0)
MCHC: 32.9 g/dL (ref 30.0–36.0)
MCV: 89 fl (ref 78.0–100.0)
Monocytes Absolute: 0.3 10*3/uL (ref 0.1–1.0)
Monocytes Relative: 7.9 % (ref 3.0–12.0)
Neutro Abs: 2.1 10*3/uL (ref 1.4–7.7)
Neutrophils Relative %: 60.3 % (ref 43.0–77.0)
Platelets: 270 10*3/uL (ref 150.0–400.0)
RBC: 4.69 Mil/uL (ref 3.87–5.11)
RDW: 13.3 % (ref 11.5–15.5)
WBC: 3.5 10*3/uL — ABNORMAL LOW (ref 4.0–10.5)

## 2020-02-12 LAB — COMPREHENSIVE METABOLIC PANEL
ALT: 21 U/L (ref 0–35)
AST: 19 U/L (ref 0–37)
Albumin: 4.8 g/dL (ref 3.5–5.2)
Alkaline Phosphatase: 53 U/L (ref 39–117)
BUN: 16 mg/dL (ref 6–23)
CO2: 29 mEq/L (ref 19–32)
Calcium: 9.9 mg/dL (ref 8.4–10.5)
Chloride: 102 mEq/L (ref 96–112)
Creatinine, Ser: 0.73 mg/dL (ref 0.40–1.20)
GFR: 77.94 mL/min (ref >60.00)
Glucose, Bld: 103 mg/dL — ABNORMAL HIGH (ref 70–99)
Potassium: 4 mEq/L (ref 3.5–5.1)
Sodium: 138 mEq/L (ref 135–145)
Total Bilirubin: 0.6 mg/dL (ref 0.2–1.2)
Total Protein: 7.5 g/dL (ref 6.0–8.3)

## 2020-02-12 LAB — LIPID PANEL
Cholesterol: 221 mg/dL — ABNORMAL HIGH (ref 0–200)
HDL: 54.2 mg/dL (ref >39.00)
LDL Cholesterol: 150 mg/dL — ABNORMAL HIGH (ref 0–99)
NonHDL: 167.16
Total CHOL/HDL Ratio: 4
Triglycerides: 85 mg/dL (ref 0.0–149.0)
VLDL: 17 mg/dL (ref 0.0–40.0)

## 2020-02-12 LAB — HEMOGLOBIN A1C: Hgb A1c MFr Bld: 5.9 % (ref 4.6–6.5)

## 2020-02-12 NOTE — Progress Notes (Signed)
Pre visit review using our clinic review tool, if applicable. No additional management support is needed unless otherwise documented below in the visit note. 

## 2020-02-12 NOTE — Patient Instructions (Addendum)
Please schedule Medicare Wellness with Glenard Haring.    Check the  blood pressure 2 or 3 times a month   BP GOAL is between 110/65 and  135/85. If it is consistently higher or lower, let me know    GO TO THE LAB : Get the blood work     [place], [place] back for a checkup in 4 months

## 2020-02-12 NOTE — Progress Notes (Signed)
Subjective:    Patient ID: Katherine Koch, female    DOB: 1945/11/26, 74 y.o.   MRN: [id number]  DOS:  02/12/2020 Type of visit - description: cpx Since the last office visit she is doing great.  Has no concerns.   Review of Systems  A 14 point review of systems is negative    Past Medical History:  Diagnosis Date  . Allergic rhinitis   . Allergy   . Arthritis   . Dry eye syndrome   . Heart murmur   . Hyperlipemia   . Hypertension   . Osteopenia    per pt had a DEXA few years ago  . Shingles 3/10    Past Surgical History:  Procedure Laterality Date  . ABDOMINAL HYSTERECTOMY     B oophorectomy- remotely  . BREAST EXCISIONAL BIOPSY Right     Allergies as of 02/12/2020   No Known Allergies     Medication List       Accurate as of February 12, 2020 11:59 PM. If you have any questions, ask your nurse or doctor.        amLODipine 10 MG tablet Commonly known as: NORVASC Take 1 tablet (10 mg total) by mouth daily.   aspirin 81 MG tablet Take 81 mg by mouth daily.   azelastine 0.1 % nasal spray Commonly known as: ASTELIN Place 2 sprays into both nostrils 2 (two) times daily. Use in each nostril as directed   CALTRATE 600+D PO Take by mouth as directed.   chlorthalidone 25 MG tablet Commonly known as: HYGROTON TAKE 1 TABLET(25 MG) BY MOUTH DAILY   losartan 50 MG tablet Commonly known as: COZAAR Take 1 tablet (50 mg total) by mouth daily.   metoprolol tartrate 25 MG tablet Commonly known as: LOPRESSOR TAKE 1/2 TABLET(12.5 MG) BY MOUTH DAILY   mometasone 50 MCG/ACT nasal spray Commonly known as: Nasonex Place 2 sprays into the nose daily.   Multivitamins Chew Chew 1 tablet by mouth daily.   potassium chloride SA 20 MEQ tablet Commonly known as: KLOR-CON Take 1 tablet (20 mEq total) by mouth daily.   pravastatin 40 MG tablet Commonly known as: PRAVACHOL Take 1 tablet (40 mg total) by mouth at bedtime.   spironolactone 25 MG tablet Commonly known  as: ALDACTONE Take 0.5 tablets (12.5 mg total) by mouth daily.          Objective:   Physical Exam BP 134/78 (BP Location: Left Arm, Patient Position: Sitting, Cuff Size: Small)   Pulse 69   Temp (!) 95.6 F (35.3 C) (Temporal)   Resp 18   Ht 5\' 6"  (1.676 m)   Wt 148 lb 4 oz (67.2 kg)   SpO2 99%   BMI 23.93 kg/m  General: Well developed, NAD, BMI noted Neck: No  thyromegaly  HEENT:  Normocephalic . Face symmetric, atraumatic Lungs:  CTA B Normal respiratory effort, no intercostal retractions, no accessory muscle use. Heart: RRR,  no murmur.  Abdomen:  Not distended, soft, non-tender. No rebound or rigidity.   Lower extremities: no pretibial edema bilaterally  Skin: Exposed areas without rash. Not pale. Not jaundice Neurologic:  alert & oriented X3.  Speech normal, gait appropriate for age and unassisted Strength symmetric and appropriate for age.  Psych: Cognition and judgment appear intact.  Cooperative with normal attention span and concentration.  Behavior appropriate. No anxious or depressed appearing.     Assessment      Assessment  Hyperglycemia: A1C 6.0  (05-2016) HTN  LVH per echo 2015: Hyperlipidemia Osteopenia : 02-2010: T score -2.4;  Tscore -1.1 2015: Tscore -2.0 Allergic rhinitis Shingles ~2010 Dry eye syndrome Palpable abdominal aorta: US abdomen  12-2015: no AAA  PLAN: Here for CPX Hyperglycemia: Has a healthy lifestyle, check A1c HTN: BP today is very good, when checked at home is even better, continue amlodipine, chlorthalidone, losartan, metoprolol, potassium supplements and spironolactone.  Checking labs Hyperlipidemia: On Pravachol, checking labs Osteopenia: On vitamin D, she remains active, check a DEXA RTC 4 months     This visit occurred during the SARS-CoV-2 public health emergency.  Safety protocols were in place, including screening questions prior to the visit, additional usage of staff PPE, and extensive cleaning of exam room  while observing appropriate contact time as indicated for disinfecting solutions.

## 2020-02-13 ENCOUNTER — Encounter: Payer: Self-pay | Admitting: Internal Medicine

## 2020-02-13 NOTE — Assessment & Plan Note (Signed)
--  Td 11/2008, declined booster - PNM and shingles:declined  -S/p Pfizer Covid shots, had a reaction to the first dose, premed for the second dose and did great. -Recommend flu shot when available --No further paps, see previous entries. She does sees gyn -- [facility] 02/2018, due, we will send a referral --FUX:[id number] per patient aprox 2004,Cscope again 5-10; Cscope 6/ 2020 , next per GI  --diet,exercise: Discussed --labs: CMP, FLP, CBC, A1c

## 2020-02-13 NOTE — Assessment & Plan Note (Signed)
Here for CPX Hyperglycemia: Has a healthy lifestyle, check A1c HTN: BP today is very good, when checked at home is even better, continue amlodipine, chlorthalidone, losartan, metoprolol, potassium supplements and spironolactone.  Checking labs Hyperlipidemia: On Pravachol, checking labs Osteopenia: On vitamin D, she remains active, check a DEXA RTC 4 months

## 2020-02-15 MED ORDER — PRAVASTATIN SODIUM 80 MG PO TABS
80.0000 mg | ORAL_TABLET | Freq: Every day | ORAL | 1 refills | Status: DC
Start: 2020-02-15 — End: 2020-05-02

## 2020-02-15 NOTE — Addendum Note (Signed)
Addended byDamita Dunnings D on: 02/15/2020 07:58 AM   Modules accepted: Orders

## 2020-04-03 ENCOUNTER — Other Ambulatory Visit: Payer: Self-pay | Admitting: Internal Medicine

## 2020-05-02 ENCOUNTER — Encounter: Payer: Self-pay | Admitting: Cardiovascular Disease

## 2020-05-02 ENCOUNTER — Other Ambulatory Visit: Payer: Self-pay

## 2020-05-02 ENCOUNTER — Ambulatory Visit: Payer: Medicare Other | Admitting: Cardiovascular Disease

## 2020-05-02 VITALS — BP 130/73 | HR 67 | Temp 95.0°F | Ht 67.0 in | Wt 156.4 lb

## 2020-05-02 DIAGNOSIS — G4733 Obstructive sleep apnea (adult) (pediatric): Secondary | ICD-10-CM | POA: Diagnosis not present

## 2020-05-02 DIAGNOSIS — E7800 Pure hypercholesterolemia, unspecified: Secondary | ICD-10-CM

## 2020-05-02 DIAGNOSIS — I10 Essential (primary) hypertension: Secondary | ICD-10-CM | POA: Diagnosis not present

## 2020-05-02 DIAGNOSIS — Z5181 Encounter for therapeutic drug level monitoring: Secondary | ICD-10-CM | POA: Diagnosis not present

## 2020-05-02 MED ORDER — SPIRONOLACTONE 25 MG PO TABS: 12.5000 mg | ORAL_TABLET | Freq: Every day | ORAL | 3 refills | Status: DC

## 2020-05-02 MED ORDER — CHLORTHALIDONE 25 MG PO TABS: ORAL_TABLET | ORAL | 3 refills | Status: DC

## 2020-05-02 MED ORDER — LOSARTAN POTASSIUM 50 MG PO TABS: 50.0000 mg | ORAL_TABLET | Freq: Every day | ORAL | 3 refills | Status: DC

## 2020-05-02 MED ORDER — PRAVASTATIN SODIUM 40 MG PO TABS: 40.0000 mg | ORAL_TABLET | Freq: Every day | ORAL | 3 refills | Status: DC

## 2020-05-02 MED ORDER — AMLODIPINE BESYLATE 10 MG PO TABS
10.0000 mg | ORAL_TABLET | Freq: Every day | ORAL | 3 refills | Status: DC
Start: 2020-05-02 — End: 2021-06-22

## 2020-05-02 MED ORDER — PRAVASTATIN SODIUM 40 MG PO TABS
40.0000 mg | ORAL_TABLET | Freq: Every day | ORAL | 3 refills | Status: DC
Start: 2020-05-02 — End: 2020-09-25

## 2020-05-02 MED ORDER — POTASSIUM CHLORIDE CRYS ER 20 MEQ PO TBCR: 20.0000 meq | EXTENDED_RELEASE_TABLET | Freq: Every day | ORAL | 3 refills | Status: DC

## 2020-05-02 MED ORDER — METOPROLOL TARTRATE 25 MG PO TABS: ORAL_TABLET | ORAL | 3 refills | Status: DC

## 2020-05-02 NOTE — Patient Instructions (Signed)
Medication Instructions:  °Your physician recommends that you continue on your current medications as directed. Please refer to the Current Medication list given to you today. ° °*If you need a refill on your cardiac medications before your next appointment, please call your pharmacy* ° ° °Lab Work: °FASTING LP/CMET IN 3 MONTHS ° °If you have labs (blood work) drawn today and your tests are completely normal, you will receive your results only by: °• MyChart Message (if you have MyChart) OR °• A paper copy in the mail °If you have any lab test that is abnormal or we need to change your treatment, we will call you to review the results. ° ° °Testing/Procedures: °NONE ° ° °Follow-Up: °At [facility], you and your health needs are our priority.  As part of our continuing mission to provide you with exceptional heart care, we have created designated Provider Care Teams.  These Care Teams include your primary Cardiologist (physician) and Advanced Practice Providers (APPs -  Physician Assistants and Nurse Practitioners) who all work together to provide you with the care you need, when you need it. ° °We recommend signing up for the patient portal called "MyChart".  Sign up information is provided on this After Visit Summary.  MyChart is used to connect with patients for Virtual Visits (Telemedicine).  Patients are able to view lab/test results, encounter notes, upcoming appointments, etc.  Non-urgent messages can be sent to your provider as well.   °To learn more about what you can do with MyChart, go to [email or web address].   ° °Your next appointment:   °12 month(s)  You will receive a reminder letter in the mail two months in advance. If you don't receive a letter, please call our office to schedule the follow-up appointment. ° °The format for your next appointment:   °In Person ° °Provider:   °You may see DR [place] or one of the following Advanced Practice Providers on your designated Care Team:   °· Luke  Kilroy, PA-C °· Callie Goodrich, PA-C °· Jesse Cleaver, FNP °  °

## 2020-05-02 NOTE — Progress Notes (Signed)
Cardiology Office Note   Date:  05/02/2020   ID:  [name], DOB 1946/01/13, MRN [id number]  PCP:  Colon Branch, MD  Cardiologist:   Skeet Latch, MD   No chief complaint on file.    History of Present Illness: Katherine Koch is a 74 y.o. female with resistent hypertension, hyperlipidemia, and LVH who presents for follow up.  Katherine Koch was previously a patient of Katherine Koch.  She was last seen 06/2016.  She had an echo 03/2014 that revealed LVEF 60-65% with grade 2 diastolic dysfunction.  She had severe left ventricular hypertrophy which was thought to be due to poorly controlled hypertension.  Her PCP recommended that she restart a statinbut was hesitant to start atorvastatin because she thinks this caused her mother to develop lymphoma.  She also is resistant to rosuvastatin as she is thinks this caused her to have yeast infections.  She did consent to restarting pravastatin, though this has not been effective at getting her to goal in the past.  Since her last appointment Katherine Koch has been doing well.  At her last appointment Katherine Koch's BP was elevated.  However she hadn't taken her medication that day and reported that it was controlled at home.  Lately she has been feeling well.  Seh hasn't been exercising much due to COVID-19.  She was previously going to the gym.  She feels good and has no chest pain or shortness of breath.  She denies LE edema, orthopnea or PND.  She hasn't noticed any lightheadedness or dizziness.  She has not been checking her blood pressure at home much lately.  She is frustrated that she has gained weight during the pandemic.  She plans to start eliminating fast food from her diet.  She recently saw Katherine Koch and her cholesterol was more elevated.  He recommended that she increase her pravastatin to 80 mg.  She has not yet done so wants to focus on diet and exercise.  Her most recent echo 02/2019 revealed LVEF greater than 65% with grade 1 diastolic  dysfunction.  There was mild posterior wall hypertrophy with moderate septal hypertrophy.  Past Medical History:  Diagnosis Date  . Allergic rhinitis   . Allergy   . Arthritis   . Dry eye syndrome   . Heart murmur   . Hyperlipemia   . Hypertension   . Osteopenia    per pt had a DEXA few years ago  . Shingles 3/10    Past Surgical History:  Procedure Laterality Date  . ABDOMINAL HYSTERECTOMY     B oophorectomy- remotely  . BREAST EXCISIONAL BIOPSY Right      Current Outpatient Medications  Medication Sig Dispense Refill  . amLODipine (NORVASC) 10 MG tablet Take 1 tablet (10 mg total) by mouth daily. 90 tablet 1  . aspirin 81 MG tablet Take 81 mg by mouth daily.      Marland Kitchen azelastine (ASTELIN) 0.1 % nasal spray Place 2 sprays into both nostrils 2 (two) times daily. Use in each nostril as directed 30 mL 3  . Calcium Carbonate-Vitamin D (CALTRATE 600+D PO) Take by mouth as directed.     . chlorthalidone (HYGROTON) 25 MG tablet TAKE 1 TABLET(25 MG) BY MOUTH DAILY 90 tablet 2  . losartan (COZAAR) 50 MG tablet Take 1 tablet (50 mg total) by mouth daily. 90 tablet 1  . metoprolol tartrate (LOPRESSOR) 25 MG tablet TAKE 1/2 TABLET(12.5 MG) BY MOUTH DAILY 45 tablet 2  .  mometasone (NASONEX) 50 MCG/ACT nasal spray Place 2 sprays into the nose daily. 17 g 12  . Multiple Vitamins-Minerals (MULTIVITAMINS) CHEW Chew 1 tablet by mouth daily.     . potassium chloride SA (KLOR-CON) 20 MEQ tablet Take 1 tablet (20 mEq total) by mouth daily. 90 tablet 1  . pravastatin (PRAVACHOL) 80 MG tablet Take 1 tablet (80 mg total) by mouth at bedtime. 90 tablet 1  . spironolactone (ALDACTONE) 25 MG tablet Take 0.5 tablets (12.5 mg total) by mouth daily. 45 tablet 3   No current facility-administered medications for this visit.    Allergies:   Patient has no known allergies.    Social History:  The patient  reports that she has quit smoking. Her smoking use included cigarettes. She has never used smokeless  tobacco. She reports that she does not drink alcohol and does not use drugs.   Family History:  The patient's family history includes Coronary artery disease in her mother; Hypertension in her brother and mother; Non-Hodgkin's lymphoma in her mother.    ROS:  Please see the history of present illness.   Otherwise, review of systems are positive for none.   All other systems are reviewed and negative.    PHYSICAL EXAM: VS:  There were no vitals taken for this visit. , BMI There is no height or weight on file to calculate BMI. GENERAL:  Well appearing HEENT: Pupils equal round and reactive, fundi not visualized, oral mucosa unremarkable NECK:  No jugular venous distention, waveform within normal limits, carotid upstroke brisk and symmetric, no bruits, no thyromegaly LYMPHATICS:  No cervical adenopathy LUNGS:  Clear to auscultation bilaterally HEART:  RRR.  PMI not displaced or sustained,S1 and S2 within normal limits, no S3, no S4, no clicks, no rubs, II/VI systolic murmurs ABD:  Flat, positive bowel sounds normal in frequency in pitch, no bruits, no rebound, no guarding, no midline pulsatile mass, no hepatomegaly, no splenomegaly EXT:  2 plus pulses throughout, no edema, no cyanosis no clubbing SKIN:  No rashes no nodules NEURO:  Cranial nerves II through XII grossly intact, motor grossly intact throughout PSYCH:  Cognitively intact, oriented to person place and time   EKG:  EKG isordered today. The ekg ordered 06/29/17 demonstrates sinus rhythm.  Rate 65 bpm. LVH with secondary repolarization abnormalities.  05/17/2018: Sinus rhythm.  Rate 72 bpm.  LVH with secondary repolarization of normalities. 05/02/2020: Sinus rhythm.  Rate 66 bpm.  LVH with repolarization abnormality.  Echo 03/28/14: Study Conclusions  - Left ventricle: The cavity size was normal. Wall thickness was increased in a pattern of severe LVH. Systolic function was normal. The estimated ejection fraction was in the  range of 60% to 65%. Features are consistent with a pseudonormal left ventricular filling pattern, with concomitant abnormal relaxation and increased filling pressure (grade 2 diastolic dysfunction). - Mitral valve: Calcified annulus. Mildly thickened leaflets . There was mild regurgitation. - Left atrium: The atrium was mildly dilated.  Recent Labs: 02/12/2020: ALT 21; BUN 16; Creatinine, Ser 0.73; Hemoglobin 13.7; Platelets 270.0; Potassium 4.0; Sodium 138    Lipid Panel    Component Value Date/Time   CHOL 221 (H) 02/12/2020 1130   TRIG 85.0 02/12/2020 1130   HDL 54.20 02/12/2020 1130   CHOLHDL 4 02/12/2020 1130   VLDL 17.0 02/12/2020 1130   LDLCALC 150 (H) 02/12/2020 1130   LDLDIRECT 155.8 02/15/2012 1354      Wt Readings from Last 3 Encounters:  02/12/20 148 lb 4 oz (67.2  kg)  10/25/19 147 lb 12.8 oz (67 kg)  10/15/19 148 lb (67.1 kg)      ASSESSMENT AND PLAN:  # Hyperlipidemia: Lipids are poorly controlled.  Katherine Koch appropriately recommended increasing her pravastatin.  However she prefers to work on diet and exercise for since she knows her diet has been poor during the pandemic.  She will continue pravastatin 40 mg for now and increased to 80 mg if her lipids remain above goal when rechecked in 3 months.    # Hypertension:  Blood pressure is stable on amlodipine, losartan, metoprolol, and spironolactone.  # Severe LVH: She is euvolemic and this is thought to be due to hypertensive heart disease.  Only mild to moderate LVH on her recent echo 02/2020.  She is euvolemic on exam today.   Current medicines are reviewed at length with the patient today.  The patient does not have concerns regarding medicines.  The following changes have been made:  no change  Labs/ tests ordered today include:   No orders of the defined types were placed in this encounter.    Disposition:   FU with Duron Meister C. Oval Linsey, MD, [facility] in 1 year      Signed, Derral Colucci C. Oval Linsey,  MD, [facility]  05/02/2020 1:11 PM    [place] patient

## 2020-05-04 ENCOUNTER — Encounter: Payer: Self-pay | Admitting: Cardiovascular Disease

## 2020-05-15 ENCOUNTER — Ambulatory Visit: Payer: Medicare Other

## 2020-05-30 ENCOUNTER — Ambulatory Visit
Admission: RE | Admit: 2020-05-30 | Discharge: 2020-05-30 | Disposition: A | Discharge status: Home / Self Care | Payer: Medicare Other | Source: Ambulatory Visit | Attending: Internal Medicine | Admitting: Internal Medicine

## 2020-05-30 ENCOUNTER — Other Ambulatory Visit: Payer: Self-pay

## 2020-06-13 ENCOUNTER — Encounter: Payer: Self-pay | Admitting: Internal Medicine

## 2020-06-13 ENCOUNTER — Other Ambulatory Visit: Payer: Self-pay

## 2020-06-13 ENCOUNTER — Ambulatory Visit: Payer: Medicare Other | Admitting: Internal Medicine

## 2020-06-13 VITALS — BP 154/77 | HR 88 | Temp 98.4°F | Resp 18 | Ht 67.0 in | Wt 153.1 lb

## 2020-06-13 DIAGNOSIS — E785 Hyperlipidemia, unspecified: Secondary | ICD-10-CM

## 2020-06-13 DIAGNOSIS — I10 Essential (primary) hypertension: Secondary | ICD-10-CM | POA: Diagnosis not present

## 2020-06-13 DIAGNOSIS — Z23 Encounter for immunization: Secondary | ICD-10-CM

## 2020-06-13 DIAGNOSIS — Z09 Encounter for follow-up examination after completed treatment for conditions other than malignant neoplasm: Secondary | ICD-10-CM

## 2020-06-13 NOTE — Progress Notes (Signed)
Subjective:    Patient ID: Katherine Koch, female    DOB: [birth year], 74 y.o.   MRN: [id number]  DOS:  06/13/2020 Type of visit - description: Routine follow-up. In general feels well.  Saw cardiology recently, note reviewed.  She is exercising more and eating healthier.  Reports allergies with runny nose and some postnasal dripping but denies fever chills or cough.   Review of Systems See above   Past Medical History:  Diagnosis Date  . Allergic rhinitis   . Allergy   . Arthritis   . Dry eye syndrome   . Heart murmur   . Hyperlipemia   . Hypertension   . Osteopenia    per pt had a DEXA few years ago  . Shingles 3/10    Past Surgical History:  Procedure Laterality Date  . ABDOMINAL HYSTERECTOMY     B oophorectomy- remotely  . BREAST EXCISIONAL BIOPSY Right     Allergies as of 06/13/2020   No Known Allergies     Medication List       Accurate as of June 13, 2020 11:59 PM. If you have any questions, ask your nurse or doctor.        amLODipine 10 MG tablet Commonly known as: NORVASC Take 1 tablet (10 mg total) by mouth daily.   aspirin 81 MG tablet Take 81 mg by mouth daily.   azelastine 0.1 % nasal spray Commonly known as: ASTELIN Place 2 sprays into both nostrils 2 (two) times daily. Use in each nostril as directed   CALTRATE 600+D PO Take by mouth as directed.   chlorthalidone 25 MG tablet Commonly known as: HYGROTON TAKE 1 TABLET(25 MG) BY MOUTH DAILY   losartan 50 MG tablet Commonly known as: COZAAR Take 1 tablet (50 mg total) by mouth daily.   metoprolol tartrate 25 MG tablet Commonly known as: LOPRESSOR TAKE 1/2 TABLET(12.5 MG) BY MOUTH DAILY   mometasone 50 MCG/ACT nasal spray Commonly known as: Nasonex Place 2 sprays into the nose daily.   Multivitamins Chew Chew 1 tablet by mouth daily.   potassium chloride SA 20 MEQ tablet Commonly known as: KLOR-CON Take 1 tablet (20 mEq total) by mouth daily.   pravastatin 40 MG  tablet Commonly known as: PRAVACHOL Take 1 tablet (40 mg total) by mouth at bedtime.   spironolactone 25 MG tablet Commonly known as: ALDACTONE Take 0.5 tablets (12.5 mg total) by mouth daily.          Objective:   Physical Exam BP (!) 154/77 (BP Location: Right Arm, Patient Position: Sitting, Cuff Size: Small)   Pulse 88   Temp 98.4 F (36.9 C) (Oral)   Resp 18   Ht 5\' 7"  (1.702 m)   Wt 153 lb 2 oz (69.5 kg)   SpO2 99%   BMI 23.98 kg/m   General:   Well developed, NAD, BMI noted. HEENT:  Normocephalic . Face symmetric, atraumatic Lungs:  CTA B Normal respiratory effort, no intercostal retractions, no accessory muscle use. Heart: RRR,  no murmur.  Lower extremities: no pretibial edema bilaterally  Skin: Not pale. Not jaundice Neurologic:  alert & oriented X3.  Speech normal, gait appropriate for age and unassisted Psych--  Cognition and judgment appear intact.  Cooperative with normal attention span and concentration.  Behavior appropriate. No anxious or depressed appearing.      Assessment     Assessment  Hyperglycemia: A1C 6.0  (05-2016) HTN LVH per echo 2015, 2021 (LVH mild-moderate): Hyperlipidemia Osteopenia :  02-2010: T score -2.4;  Tscore -1.1 2015: Tscore -2.0 Allergic rhinitis Shingles ~2010 Dry eye syndrome Palpable abdominal aorta: US abdomen  12-2015: no AAA  PLAN: HTN: Ambulatory BPs are always better than today's BP.  Continue chlorthalidone, losartan, metoprolol, potassium, Aldactone. High cholesterol: Saw cardiology 05/02/2020: They recommended to increase Pravachol to 80 mg if lipids not at goal.  They are planning to blood work in December 2021.  Patient has improve her lifestyle. LVH: Felt to be from hypertensive heart disease, LVH mild to moderate on echo 02-2020. Preventive care: Flu shot today, Td today (declined to get a Tdap at the pharmacy), declined Prevnar, plans to get a booster for Covid soon. RTC 01/2021 CPX   This visit  occurred during the SARS-CoV-2 public health emergency.  Safety protocols were in place, including screening questions prior to the visit, additional usage of staff PPE, and extensive cleaning of exam room while observing appropriate contact time as indicated for disinfecting solutions.

## 2020-06-13 NOTE — Patient Instructions (Addendum)
Check the  blood pressure regularly BP GOAL is between 110/65 and  135/85. If it is consistently higher or lower, let me know      [place], PLEASE SCHEDULE YOUR APPOINTMENTS Come back for blood work by December, schedule a lab appointment  Come back for a physical exam by June 2022

## 2020-06-13 NOTE — Progress Notes (Signed)
Pre visit review using our clinic review tool, if applicable. No additional management support is needed unless otherwise documented below in the visit note. 

## 2020-06-14 NOTE — Assessment & Plan Note (Signed)
HTN: Ambulatory BPs are always better than today's BP.  Continue chlorthalidone, losartan, metoprolol, potassium, Aldactone. High cholesterol: Saw cardiology 05/02/2020: They recommended to increase Pravachol to 80 mg if lipids not at goal.  They are planning to blood work in December 2021.  Patient has improve her lifestyle. LVH: Felt to be from hypertensive heart disease, LVH mild to moderate on echo 02-2020. Preventive care: Flu shot today, Td today (declined to get a Tdap at the pharmacy), declined Prevnar, plans to get a booster for Covid soon. RTC 01/2021 CPX

## 2020-07-16 ENCOUNTER — Telehealth: Payer: Self-pay | Admitting: Internal Medicine

## 2020-07-16 NOTE — Telephone Encounter (Signed)
Patient states she has sinus infection and scheduled for her covid booster on Saturday , patient would like to know if she can still get her booster.   please advise

## 2020-07-17 NOTE — Telephone Encounter (Signed)
Called pt and advised per Dr Larose Kells, that as long that she is not having fever or yellow/greenish mucus discharge she should be find to get her Covid booster vaccine .

## 2020-07-19 ENCOUNTER — Ambulatory Visit: Payer: Medicare Other | Attending: Internal Medicine

## 2020-07-19 DIAGNOSIS — Z23 Encounter for immunization: Secondary | ICD-10-CM

## 2020-07-19 NOTE — Progress Notes (Signed)
   Covid-19 Vaccination Clinic  Name:  Atasha Colebank    MRN: [id number] DOB: [birth year]  07/19/2020  Ms. Burpee was observed post Covid-19 immunization for 15 minutes without incident. She was provided with Vaccine Information Sheet and instruction to access the V-Safe system.   Ms. Billard was instructed to call 911 with any severe reactions post vaccine: Marland Kitchen Difficulty breathing  . Swelling of face and throat  . A fast heartbeat  . A bad rash all over body  . Dizziness and weakness   Immunizations Administered    Name Date Dose VIS Date Route   Pfizer COVID-19 Vaccine 07/19/2020 11:44 AM 0.3 mL 06/04/2020 Intramuscular   Manufacturer: [place]   Lot: [id number]   NDC: [address]-1

## 2020-07-29 ENCOUNTER — Other Ambulatory Visit: Payer: Medicare Other

## 2020-08-29 ENCOUNTER — Other Ambulatory Visit: Payer: Medicare Other

## 2020-09-02 ENCOUNTER — Encounter: Payer: Self-pay | Admitting: Internal Medicine

## 2020-09-02 ENCOUNTER — Telehealth (INDEPENDENT_AMBULATORY_CARE_PROVIDER_SITE_OTHER): Payer: Medicare Other | Admitting: Internal Medicine

## 2020-09-02 VITALS — BP 142/74 | HR 60 | Ht 67.0 in | Wt 151.0 lb

## 2020-09-02 DIAGNOSIS — J019 Acute sinusitis, unspecified: Secondary | ICD-10-CM | POA: Diagnosis not present

## 2020-09-02 MED ORDER — AZELASTINE HCL 0.1 % NA SOLN
2.0000 | Freq: Two times a day (BID) | NASAL | 3 refills | Status: DC
Start: 2020-09-02 — End: 2021-08-28

## 2020-09-02 MED ORDER — AMOXICILLIN-POT CLAVULANATE 875-125 MG PO TABS: 1.0000 | ORAL_TABLET | Freq: Two times a day (BID) | ORAL | 0 refills | Status: DC

## 2020-09-02 MED ORDER — FLUCONAZOLE 150 MG PO TABS: 150.0000 mg | ORAL_TABLET | Freq: Every day | ORAL | 0 refills | Status: DC

## 2020-09-02 NOTE — Progress Notes (Signed)
Subjective:    Patient ID: Katherine Koch, female    DOB: 12-15-1945, 75 y.o.   MRN: [id number]  DOS:  09/02/2020 Type of visit - description: Virtual Visit via Telephone    I connected with above mentioned patient  by telephone and verified that I am speaking with the correct person using two identifiers.  THIS ENCOUNTER IS A VIRTUAL VISIT DUE TO COVID-19 - PATIENT WAS NOT SEEN IN THE OFFICE. PATIENT HAS CONSENTED TO VIRTUAL VISIT / TELEMEDICINE VISIT   Location of patient: home  Location of provider: office  Persons participating in the virtual visit: patient, provider   I discussed the limitations, risks, security and privacy concerns of performing an evaluation and management service by telephone and the availability of in person appointments. I also discussed with the patient that there may be a patient responsible charge related to this service. The patient expressed understanding and agreed to proceed.  Acute Started with sinus congestion the last week of December, then she was exposed to [place] 08/18/2020 (her daughter was sick). Since then she isolate herself and overall started to feel better however last week sinus symptoms got worse: More sinus pressure, yellow/bloody discharge particularly from the left nostril associated with left ear pain.  No fever chills No nausea or vomitings No myalgias No chest pain or difficulty breathing. She has mild cough.   Review of Systems See above   Past Medical History:  Diagnosis Date  . Allergic rhinitis   . Allergy   . Arthritis   . Dry eye syndrome   . Heart murmur   . Hyperlipemia   . Hypertension   . Osteopenia    per pt had a DEXA few years ago  . Shingles 3/10    Past Surgical History:  Procedure Laterality Date  . ABDOMINAL HYSTERECTOMY     B oophorectomy- remotely  . BREAST EXCISIONAL BIOPSY Right     Allergies as of 09/02/2020   No Known Allergies     Medication List       Accurate as of September 02, 2020  9:47 PM. If you have any questions, ask your nurse or doctor.        amLODipine 10 MG tablet Commonly known as: NORVASC Take 1 tablet (10 mg total) by mouth daily.   amoxicillin-clavulanate 875-125 MG tablet Commonly known as: Augmentin Take 1 tablet by mouth 2 (two) times daily. Started by: Kathlene November, MD   aspirin 81 MG tablet Take 81 mg by mouth daily.   azelastine 0.1 % nasal spray Commonly known as: ASTELIN Place 2 sprays into both nostrils 2 (two) times daily. Use in each nostril as directed   CALTRATE 600+D PO Take by mouth as directed.   chlorthalidone 25 MG tablet Commonly known as: HYGROTON TAKE 1 TABLET(25 MG) BY MOUTH DAILY   fluconazole 150 MG tablet Commonly known as: DIFLUCAN Take 1 tablet (150 mg total) by mouth daily. Started by: Kathlene November, MD   losartan 50 MG tablet Commonly known as: COZAAR Take 1 tablet (50 mg total) by mouth daily.   metoprolol tartrate 25 MG tablet Commonly known as: LOPRESSOR TAKE 1/2 TABLET(12.5 MG) BY MOUTH DAILY   mometasone 50 MCG/ACT nasal spray Commonly known as: Nasonex Place 2 sprays into the nose daily.   Multivitamins Chew Chew 1 tablet by mouth daily.   potassium chloride SA 20 MEQ tablet Commonly known as: KLOR-CON Take 1 tablet (20 mEq total) by mouth daily.   pravastatin 40 MG  tablet Commonly known as: PRAVACHOL Take 1 tablet (40 mg total) by mouth at bedtime.   spironolactone 25 MG tablet Commonly known as: ALDACTONE Take 0.5 tablets (12.5 mg total) by mouth daily.          Objective:   Physical Exam BP (!) 142/74   Pulse 60   Ht 5\' 7"  (1.702 m)   Wt 151 lb (68.5 kg)   SpO2 98%   BMI 23.65 kg/m  This is a telephone visit, alert oriented x3, in no distress, no cough, minimal nasal congestion noted    Assessment       Assessment  Hyperglycemia: A1C 6.0  (05-2016) HTN LVH per echo 2015, 2021 (LVH mild-moderate): Hyperlipidemia Osteopenia : 02-2010: T score -2.4;  Tscore -1.1 2015:  Tscore -2.0 Allergic rhinitis Shingles ~2010 Dry eye syndrome Palpable abdominal aorta: US abdomen  12-2015: no AAA  PLAN: Sinusitis: The patient is triple vaccinated for COVID, developed sinus symptoms approximately 3 weeks ago, subsequently was exposed to COVID but did not develop major symptoms (denies fever, chills, chest pain, difficulty breathing). At this point her main concern is the sinus congestion as described above. Suspect sinusitis, Rx Augmentin, Astelin, Tylenol, Robitussin and Diflucan (per patient request).  Call if not better. High cholesterol: Due for labs.  She already has a lab appointment   I discussed the assessment and treatment plan with the patient. The patient was provided an opportunity to ask questions and all were answered. The patient agreed with the plan and demonstrated an understanding of the instructions.   The patient was advised to call back or seek an in-person evaluation if the symptoms worsen or if the condition fails to improve as anticipated.  I provided 20  minutes of non-face-to-face time during this encounter.  Kathlene November, MD

## 2020-09-02 NOTE — Progress Notes (Signed)
Pre visit review using our clinic review tool, if applicable. No additional management support is needed unless otherwise documented below in the visit note. 

## 2020-09-02 NOTE — Assessment & Plan Note (Signed)
Sinusitis: The patient is triple vaccinated for COVID, developed sinus symptoms approximately 3 weeks ago, subsequently was exposed to COVID but did not develop major symptoms (denies fever, chills, chest pain, difficulty breathing). At this point her main concern is the sinus congestion as described above. Suspect sinusitis, Rx Augmentin, Astelin, Tylenol, Robitussin and Diflucan (per patient request).  Call if not better. High cholesterol: Due for labs.  She already has a lab appointment

## 2020-09-10 ENCOUNTER — Telehealth: Payer: Self-pay | Admitting: Internal Medicine

## 2020-09-10 NOTE — Telephone Encounter (Signed)
Please advise 

## 2020-09-10 NOTE — Telephone Encounter (Signed)
Patient states she was seeing on 09/02/20 by [name] and was told that is she wasn't better to let him know. Patient states she is done with antibiotic and sinus are not better.

## 2020-09-11 MED ORDER — FLUCONAZOLE 150 MG PO TABS: 150.0000 mg | ORAL_TABLET | Freq: Every day | ORAL | 0 refills | Status: DC

## 2020-09-11 NOTE — Telephone Encounter (Signed)
Please advise 

## 2020-09-11 NOTE — Addendum Note (Signed)
Addended byDamita Dunnings D on: 09/11/2020 03:32 PM   Modules accepted: Orders

## 2020-09-11 NOTE — Telephone Encounter (Signed)
Spoke to patient appt schedule for 09/15/20( pt stated she will wait for him)  She is experiencing yeast due to the antibiotic. She wants to know if you could give her something for it

## 2020-09-11 NOTE — Telephone Encounter (Signed)
Arrange in person office visit

## 2020-09-11 NOTE — Telephone Encounter (Signed)
Diflucan 150 mg 1 p.o. daily #2 no refills

## 2020-09-11 NOTE — Telephone Encounter (Signed)
Spoke w/ Pt- she has tried Monistat OTC creams, completed the Diflucan but had diarrhea for 5 days (improved now) and is now having discharge and itching again. She has an appt on Monday 09/15/2020 but is insistent on a refill please.

## 2020-09-11 NOTE — Telephone Encounter (Signed)
Can you arrange an in person office visit (per [name] will have to be with another provider or she will have to go to urgent care). TY

## 2020-09-11 NOTE — Telephone Encounter (Signed)
She was prescribed Diflucan at the time of the visit. If that is not helping, recommend vaginal OTC cream.

## 2020-09-11 NOTE — Telephone Encounter (Signed)
Spoke w/ Pt- informed Rx sent. Pt very thankful.

## 2020-09-15 ENCOUNTER — Ambulatory Visit: Payer: Medicare Other | Admitting: Internal Medicine

## 2020-09-15 ENCOUNTER — Other Ambulatory Visit: Payer: Self-pay

## 2020-09-15 ENCOUNTER — Encounter: Payer: Self-pay | Admitting: Internal Medicine

## 2020-09-15 VITALS — BP 164/97 | HR 87 | Temp 98.7°F | Resp 16 | Ht 67.0 in | Wt 151.1 lb

## 2020-09-15 DIAGNOSIS — J019 Acute sinusitis, unspecified: Secondary | ICD-10-CM

## 2020-09-15 DIAGNOSIS — N760 Acute vaginitis: Secondary | ICD-10-CM | POA: Diagnosis not present

## 2020-09-15 NOTE — Progress Notes (Unsigned)
Pre visit review using our clinic review tool, if applicable. No additional management support is needed unless otherwise documented below in the visit note. 

## 2020-09-15 NOTE — Patient Instructions (Signed)
Astelin: Use as needed only  Get the saline nose spray, use it as needed  Next visit should be by June 2022

## 2020-09-15 NOTE — Progress Notes (Signed)
Subjective:    Patient ID: Katherine Koch, female    DOB: 27-Jun-1946, 75 y.o.   MRN: [id number]  DOS:  09/15/2020 Type of visit - description: Acute  Onset of symptoms approximately late December, was seen virtually 09/02/2020, DX was sinusitis, was prescribed Augmentin, Astelin and Diflucan.  She called last week to get this appointment as the symptoms continue.  Since then she is better. Still feels like there is something dry in her nostrils.  She also developed vaginitis: Improved  Review of Systems No fever chills Cough much decreased No sore throat  Past Medical History:  Diagnosis Date  . Allergic rhinitis   . Allergy   . Arthritis   . Dry eye syndrome   . Heart murmur   . Hyperlipemia   . Hypertension   . Osteopenia    per pt had a DEXA few years ago  . Shingles 3/10    Past Surgical History:  Procedure Laterality Date  . ABDOMINAL HYSTERECTOMY     B oophorectomy- remotely  . BREAST EXCISIONAL BIOPSY Right     Allergies as of 09/15/2020   No Known Allergies     Medication List       Accurate as of September 15, 2020 11:59 PM. If you have any questions, ask your nurse or doctor.        STOP taking these medications   amoxicillin-clavulanate 875-125 MG tablet Commonly known as: Augmentin Stopped by: Kathlene November, MD   fluconazole 150 MG tablet Commonly known as: DIFLUCAN Stopped by: Kathlene November, MD     TAKE these medications   amLODipine 10 MG tablet Commonly known as: NORVASC Take 1 tablet (10 mg total) by mouth daily.   aspirin 81 MG tablet Take 81 mg by mouth daily.   azelastine 0.1 % nasal spray Commonly known as: ASTELIN Place 2 sprays into both nostrils 2 (two) times daily. Use in each nostril as directed   CALTRATE 600+D PO Take by mouth as directed.   chlorthalidone 25 MG tablet Commonly known as: HYGROTON TAKE 1 TABLET(25 MG) BY MOUTH DAILY   losartan 50 MG tablet Commonly known as: COZAAR Take 1 tablet (50 mg total) by mouth  daily.   metoprolol tartrate 25 MG tablet Commonly known as: LOPRESSOR TAKE 1/2 TABLET(12.5 MG) BY MOUTH DAILY   mometasone 50 MCG/ACT nasal spray Commonly known as: Nasonex Place 2 sprays into the nose daily.   Multivitamins Chew Chew 1 tablet by mouth daily.   potassium chloride SA 20 MEQ tablet Commonly known as: KLOR-CON Take 1 tablet (20 mEq total) by mouth daily.   pravastatin 40 MG tablet Commonly known as: PRAVACHOL Take 1 tablet (40 mg total) by mouth at bedtime.   spironolactone 25 MG tablet Commonly known as: ALDACTONE Take 0.5 tablets (12.5 mg total) by mouth daily.          Objective:   Physical Exam BP (!) 164/97 (BP Location: Left Arm, Patient Position: Sitting, Cuff Size: Small)   Pulse 87   Temp 98.7 F (37.1 C) (Oral)   Resp 16   Ht 5\' 7"  (1.702 m)   Wt 151 lb 2 oz (68.5 kg)   SpO2 97%   BMI 23.67 kg/m  General:   Well developed, NAD, BMI noted. HEENT:  Normocephalic . Face symmetric, atraumatic. Nostrils slightly erythematous otherwise normal. Sinuses: No TTP Lungs:  CTA B Normal respiratory effort, no intercostal retractions, no accessory muscle use. Heart: RRR,  no murmur.  Lower extremities: no  pretibial edema bilaterally  Skin: Not pale. Not jaundice Neurologic:  alert & oriented X3.  Speech normal, gait appropriate for age and unassisted Psych--  Cognition and judgment appear intact.  Cooperative with normal attention span and concentration.  Behavior appropriate. No anxious or depressed appearing.      Assessment      Assessment  Hyperglycemia: A1C 6.0  (05-2016) HTN LVH per echo 2015, 2021 (LVH mild-moderate): Hyperlipidemia Osteopenia : 02-2010: T score -2.4;  Tscore -1.1 2015: Tscore -2.0 Allergic rhinitis Shingles ~2010 Dry eye syndrome Palpable abdominal aorta: US abdomen  12-2015: no AAA  PLAN: Sinusitis: Was treated with antibiotics several days ago, she called last week, was no better however today she feels  much improved with only some nostril congestion. Plan: Change Astelin to as needed, use saline spray to clear the nostrils, a Nettie pot would be okay as well. Vaginitis: After antibiotics develop vaginitis, required Diflucan, 2 rounds, now better. RTC March June 2022   This visit occurred during the SARS-CoV-2 public health emergency.  Safety protocols were in place, including screening questions prior to the visit, additional usage of staff PPE, and extensive cleaning of exam room while observing appropriate contact time as indicated for disinfecting solutions.

## 2020-09-16 NOTE — Assessment & Plan Note (Signed)
Sinusitis: Was treated with antibiotics several days ago, she called last week, was no better however today she feels much improved with only some nostril congestion. Plan: Change Astelin to as needed, use saline spray to clear the nostrils, a Nettie pot would be okay as well. Vaginitis: After antibiotics develop vaginitis, required Diflucan, 2 rounds, now better. RTC March June 2022

## 2020-09-23 ENCOUNTER — Telehealth: Payer: Self-pay | Admitting: Internal Medicine

## 2020-09-23 ENCOUNTER — Other Ambulatory Visit (INDEPENDENT_AMBULATORY_CARE_PROVIDER_SITE_OTHER): Payer: Medicare Other

## 2020-09-23 ENCOUNTER — Other Ambulatory Visit: Payer: Self-pay

## 2020-09-23 DIAGNOSIS — E785 Hyperlipidemia, unspecified: Secondary | ICD-10-CM | POA: Diagnosis not present

## 2020-09-23 DIAGNOSIS — I10 Essential (primary) hypertension: Secondary | ICD-10-CM

## 2020-09-23 NOTE — Telephone Encounter (Signed)
When her labs return later today can you also forward them to Dr. Blenda Mounts attention please?

## 2020-09-23 NOTE — Telephone Encounter (Signed)
Patient would like labs results from today 2.06.22 to be sent to Dr. Skeet Latch

## 2020-09-24 LAB — LIPID PANEL
Cholesterol: 212 mg/dL — ABNORMAL HIGH (ref <200)
HDL: 55 mg/dL (ref >=50)
LDL Cholesterol (Calc): 139 mg/dL (calc) — ABNORMAL HIGH
Non-HDL Cholesterol (Calc): 157 mg/dL (calc) — ABNORMAL HIGH (ref <130)
Total CHOL/HDL Ratio: 3.9 (calc) (ref <5.0)
Triglycerides: 79 mg/dL (ref <150)

## 2020-09-24 LAB — COMPREHENSIVE METABOLIC PANEL
AG Ratio: 1.6 (calc) (ref 1.0–2.5)
ALT: 22 U/L (ref 6–29)
AST: 21 U/L (ref 10–35)
Albumin: 4.6 g/dL (ref 3.6–5.1)
Alkaline phosphatase (APISO): 53 U/L (ref 37–153)
BUN: 17 mg/dL (ref 7–25)
CO2: 29 mmol/L (ref 20–32)
Calcium: 10.2 mg/dL (ref 8.6–10.4)
Chloride: 102 mmol/L (ref 98–110)
Creat: 0.69 mg/dL (ref 0.60–0.93)
Globulin: 2.8 g/dL (calc) (ref 1.9–3.7)
Glucose, Bld: 101 mg/dL — ABNORMAL HIGH (ref 65–99)
Potassium: 4.1 mmol/L (ref 3.5–5.3)
Sodium: 138 mmol/L (ref 135–146)
Total Bilirubin: 0.7 mg/dL (ref 0.2–1.2)
Total Protein: 7.4 g/dL (ref 6.1–8.1)

## 2020-09-25 ENCOUNTER — Other Ambulatory Visit: Payer: Self-pay

## 2020-09-25 MED ORDER — PRAVASTATIN SODIUM 80 MG PO TABS
80.0000 mg | ORAL_TABLET | Freq: Every day | ORAL | 3 refills | Status: DC
Start: 2020-09-25 — End: 2021-10-22

## 2020-09-25 NOTE — Telephone Encounter (Signed)
See results

## 2020-09-25 NOTE — Progress Notes (Signed)
Pt called back and advised of results and medication changed. New Rx sent in to her pharmacy. -JMA

## 2020-10-05 ENCOUNTER — Other Ambulatory Visit: Payer: Self-pay | Admitting: Cardiovascular Disease

## 2020-10-28 ENCOUNTER — Other Ambulatory Visit: Payer: Self-pay | Admitting: Internal Medicine

## 2020-12-30 ENCOUNTER — Encounter: Payer: Self-pay | Admitting: Family

## 2020-12-30 ENCOUNTER — Ambulatory Visit: Payer: Medicare Other | Admitting: Family

## 2020-12-30 ENCOUNTER — Other Ambulatory Visit: Payer: Self-pay

## 2020-12-30 VITALS — BP 122/82 | HR 75 | Resp 16 | Ht 67.0 in | Wt 156.0 lb

## 2020-12-30 DIAGNOSIS — M6740 Ganglion, unspecified site: Secondary | ICD-10-CM

## 2020-12-30 NOTE — Patient Instructions (Signed)
https://www.foothealthfacts.org/conditions/ganglion-cyst"> https://www.clinicalkey.com">  Ganglion Cyst  A ganglion cyst is a non-cancerous, fluid-filled lump of tissue that occurs near a joint, tendon, or ligament. The cyst grows out of a joint or the lining of a tendon or ligament. Ganglion cysts most often develop in the hand or wrist, but they can also develop in the shoulder, elbow, hip, knee, ankle, or foot. Ganglion cysts are ball-shaped or egg-shaped. Their size can range from the size of a pea to larger than a grape. Increased activity may cause the cyst to get bigger because more fluid starts to build up. What are the causes? The exact cause of this condition is not known, but it may be related to:  Inflammation or irritation around the joint.  An injury or tear in the layers of tissue around the joint (joint capsule).  Repetitive movements or overuse.  History of acute or repeated injury. What increases the risk? You are more likely to develop this condition if:  You are a female.  You are 20-40 years old. What are the signs or symptoms? The main symptom of this condition is a lump. It most often appears on the hand or wrist. In many cases, there are no other symptoms, but a cyst can sometimes cause:  Tingling.  Pain or tenderness.  Numbness.  Weakness or loss of strength in the affected joint.  Decreased range of motion in the affected area of the body.   How is this diagnosed? Ganglion cysts are usually diagnosed based on a physical exam. Your health care provider will feel the lump and may shine a light next to it. If it is a ganglion cyst, the light will likely shine through it. Your health care provider may order an X-ray, ultrasound, MRI, or CT scan to rule out other conditions. How is this treated? Ganglion cysts often go away on their own without treatment. If you have pain or other symptoms, treatment may be needed. Treatment is also needed if the ganglion  cyst limits your movement or if it gets infected. Treatment may include:  Wearing a brace or splint on your wrist or finger.  Taking anti-inflammatory medicine.  Having fluid drained from the lump with a needle (aspiration).  Getting an injection of medicine into the joint to decrease inflammation. This may be corticosteroids, ethanol, or hyaluronidase.  Having surgery to remove the ganglion cyst.  Placing a pad in your shoe or wearing shoes that will not rub against the cyst if it is on your foot. Follow these instructions at home:  Do not press on the ganglion cyst, poke it with a needle, or hit it.  Take over-the-counter and prescription medicines only as told by your health care provider.  If you have a brace or splint: ? Wear it as told by your health care provider. ? Remove it as told by your health care provider. Ask if you need to remove it when you take a shower or a bath.  Watch your ganglion cyst for any changes.  Keep all follow-up visits as told by your health care provider. This is important. Contact a health care provider if:  Your ganglion cyst becomes larger or more painful.  You have pus coming from the lump.  You have weakness or numbness in the affected area.  You have a fever or chills. Get help right away if:  You have a fever and have any of these in the cyst area: ? Increased redness. ? Red streaks. ? Swelling. Summary  A   ganglion cyst is a non-cancerous, fluid-filled lump that occurs near a joint, tendon, or ligament.  Ganglion cysts most often develop in the hand or wrist, but they can also develop in the shoulder, elbow, hip, knee, ankle, or foot.  Ganglion cysts often go away on their own without treatment. This information is not intended to replace advice given to you by your health care provider. Make sure you discuss any questions you have with your health care provider. Document Revised: 10/24/2019 Document Reviewed:  10/24/2019 Elsevier Patient Education  2021 Elsevier Inc.  

## 2020-12-30 NOTE — Progress Notes (Signed)
Katherine Koch is a 75 y.o. female with the following history as recorded in [employer]:  Patient Active Problem List   Diagnosis Date Noted  . PCP NOTES >>> 05/28/2015  . OSA (obstructive sleep apnea) 08/23/2014  . Dry eye syndrome   . Annual physical exam 02/12/2011  . ANXIETY STATE, UNSPECIFIED 06/10/2009  . Allergic rhinitis 06/10/2009  . Hyperlipidemia 06/18/2008  . Essential hypertension, benign 06/18/2008  . Osteopenia 06/18/2008    Current Outpatient Medications  Medication Sig Dispense Refill  . amLODipine (NORVASC) 10 MG tablet Take 1 tablet (10 mg total) by mouth daily. 90 tablet 3  . aspirin 81 MG tablet Take 81 mg by mouth daily.    Marland Kitchen azelastine (ASTELIN) 0.1 % nasal spray Place 2 sprays into both nostrils 2 (two) times daily. Use in each nostril as directed 30 mL 3  . Calcium Carbonate-Vitamin D (CALTRATE 600+D PO) Take by mouth as directed.    . chlorthalidone (HYGROTON) 25 MG tablet TAKE 1 TABLET(25 MG) BY MOUTH DAILY 90 tablet 3  . losartan (COZAAR) 50 MG tablet Take 1 tablet (50 mg total) by mouth daily. 90 tablet 1  . metoprolol tartrate (LOPRESSOR) 25 MG tablet TAKE 1/2 TABLET(12.5 MG) BY MOUTH DAILY 45 tablet 8  . mometasone (NASONEX) 50 MCG/ACT nasal spray Place 2 sprays into the nose daily. 17 g 12  . Multiple Vitamins-Minerals (MULTIVITAMINS) CHEW Chew 1 tablet by mouth daily.     . potassium chloride SA (KLOR-CON) 20 MEQ tablet Take 1 tablet (20 mEq total) by mouth daily. 90 tablet 3  . pravastatin (PRAVACHOL) 80 MG tablet Take 1 tablet (80 mg total) by mouth daily. 90 tablet 3  . spironolactone (ALDACTONE) 25 MG tablet Take 0.5 tablets (12.5 mg total) by mouth daily. 45 tablet 3   No current facility-administered medications for this visit.    Allergies: Patient has no known allergies.  Past Medical History:  Diagnosis Date  . Allergic rhinitis   . Allergy   . Arthritis   . Dry eye syndrome   . Heart murmur   . Hyperlipemia   . Hypertension   .  Osteopenia    per pt had a DEXA few years ago  . Shingles 3/10    Past Surgical History:  Procedure Laterality Date  . ABDOMINAL HYSTERECTOMY     B oophorectomy- remotely  . BREAST EXCISIONAL BIOPSY Right     Family History  Problem Relation Age of Onset  . Coronary artery disease Mother        onset?  Marland Kitchen Hypertension Mother   . Non-Hodgkin's lymphoma Mother        ? due to Lipitor  . Hypertension Brother   . Diabetes Neg Hx   . Stroke Neg Hx   . Colon cancer Neg Hx   . Breast cancer Neg Hx   . Rectal cancer Neg Hx   . Stomach cancer Neg Hx     Social History   Tobacco Use  . Smoking status: Former Smoker    Types: Cigarettes  . Smokeless tobacco: Never Used  . Tobacco comment: quit 2011, 1/2 ppd , age of onset? smoked off-on  Substance Use Topics  . Alcohol use: No    Alcohol/week: 0.0 standard drinks    Subjective:  Patient presents with concerns for 3 week history of right hand pain; notes that she hit her hand 3 weeks ago and secondary "knot" developed on the wrist; denies any numbness or tingling or loss of use  of right hand;    Objective:  Vitals:   12/30/20 1340  BP: 122/82  Pulse: 75  Resp: 16  SpO2: 99%  Weight: 156 lb (70.8 kg)  Height: 5\' 7"  (1.702 m)    General: Well developed, well nourished, in no acute distress  Skin : Warm and dry.  Head: Normocephalic and atraumatic  Lungs: Respirations unlabored;  Musculoskeletal: No deformities; no active joint inflammation; ganglion cyst noted;  Extremities: No edema, cyanosis, clubbing  Vessels: Symmetric bilaterally  Neurologic: Alert and oriented; speech intact; face symmetrical; moves all extremities well; CNII-XII intact without focal deficit   Assessment:  1. Ganglion cyst     Plan:  Reassurance; discussed seeing specialist to have area drained but she wants to watch and wait for now which is appropriate; she will call back if area becomes painful/ problematic or she decides she wants to see  specialist.  This visit occurred during the SARS-CoV-2 public health emergency.  Safety protocols were in place, including screening questions prior to the visit, additional usage of staff PPE, and extensive cleaning of exam room while observing appropriate contact time as indicated for disinfecting solutions.     No follow-ups on file.  No orders of the defined types were placed in this encounter.   Requested Prescriptions    No prescriptions requested or ordered in this encounter

## 2021-02-18 ENCOUNTER — Encounter: Payer: Self-pay | Admitting: Internal Medicine

## 2021-02-18 ENCOUNTER — Other Ambulatory Visit: Payer: Self-pay

## 2021-02-18 ENCOUNTER — Ambulatory Visit (INDEPENDENT_AMBULATORY_CARE_PROVIDER_SITE_OTHER): Payer: Medicare Other | Admitting: Internal Medicine

## 2021-02-18 VITALS — BP 138/78 | HR 73 | Temp 98.4°F | Resp 18 | Ht 67.0 in | Wt 153.0 lb

## 2021-02-18 DIAGNOSIS — R739 Hyperglycemia, unspecified: Secondary | ICD-10-CM | POA: Diagnosis not present

## 2021-02-18 DIAGNOSIS — Z0001 Encounter for general adult medical examination with abnormal findings: Secondary | ICD-10-CM

## 2021-02-18 DIAGNOSIS — Z0000 Encounter for general adult medical examination without abnormal findings: Secondary | ICD-10-CM

## 2021-02-18 DIAGNOSIS — R1319 Other dysphagia: Secondary | ICD-10-CM

## 2021-02-18 DIAGNOSIS — I10 Essential (primary) hypertension: Secondary | ICD-10-CM

## 2021-02-18 DIAGNOSIS — M8580 Other specified disorders of bone density and structure, unspecified site: Secondary | ICD-10-CM

## 2021-02-18 DIAGNOSIS — M71331 Other bursal cyst, right wrist: Secondary | ICD-10-CM

## 2021-02-18 DIAGNOSIS — E785 Hyperlipidemia, unspecified: Secondary | ICD-10-CM

## 2021-02-18 DIAGNOSIS — Z780 Asymptomatic menopausal state: Secondary | ICD-10-CM

## 2021-02-18 LAB — CBC WITH DIFFERENTIAL/PLATELET
Basophils Absolute: 0 10*3/uL (ref 0.0–0.1)
Basophils Relative: 0.8 % (ref 0.0–3.0)
Eosinophils Absolute: 0.2 10*3/uL (ref 0.0–0.7)
Eosinophils Relative: 4.8 % (ref 0.0–5.0)
HCT: 41.9 % (ref 36.0–46.0)
Hemoglobin: 14.2 g/dL (ref 12.0–15.0)
Lymphocytes Relative: 29 % (ref 12.0–46.0)
Lymphs Abs: 1 10*3/uL (ref 0.7–4.0)
MCHC: 34 g/dL (ref 30.0–36.0)
MCV: 86.6 fl (ref 78.0–100.0)
Monocytes Absolute: 0.3 10*3/uL (ref 0.1–1.0)
Monocytes Relative: 7.9 % (ref 3.0–12.0)
Neutro Abs: 2 10*3/uL (ref 1.4–7.7)
Neutrophils Relative %: 57.5 % (ref 43.0–77.0)
Platelets: 261 10*3/uL (ref 150.0–400.0)
RBC: 4.84 Mil/uL (ref 3.87–5.11)
RDW: 13.2 % (ref 11.5–15.5)
WBC: 3.4 10*3/uL — ABNORMAL LOW (ref 4.0–10.5)

## 2021-02-18 LAB — COMPREHENSIVE METABOLIC PANEL
ALT: 24 U/L (ref 0–35)
AST: 24 U/L (ref 0–37)
Albumin: 4.8 g/dL (ref 3.5–5.2)
Alkaline Phosphatase: 57 U/L (ref 39–117)
BUN: 13 mg/dL (ref 6–23)
CO2: 29 mEq/L (ref 19–32)
Calcium: 10.6 mg/dL — ABNORMAL HIGH (ref 8.4–10.5)
Chloride: 101 mEq/L (ref 96–112)
Creatinine, Ser: 0.82 mg/dL (ref 0.40–1.20)
GFR: 70.18 mL/min (ref >60.00)
Glucose, Bld: 94 mg/dL (ref 70–99)
Potassium: 4 mEq/L (ref 3.5–5.1)
Sodium: 139 mEq/L (ref 135–145)
Total Bilirubin: 0.8 mg/dL (ref 0.2–1.2)
Total Protein: 7.8 g/dL (ref 6.0–8.3)

## 2021-02-18 LAB — LIPID PANEL
Cholesterol: 212 mg/dL — ABNORMAL HIGH (ref 0–200)
HDL: 52.9 mg/dL (ref >39.00)
LDL Cholesterol: 139 mg/dL — ABNORMAL HIGH (ref 0–99)
NonHDL: 158.71
Total CHOL/HDL Ratio: 4
Triglycerides: 100 mg/dL (ref 0.0–149.0)
VLDL: 20 mg/dL (ref 0.0–40.0)

## 2021-02-18 LAB — HEMOGLOBIN A1C: Hgb A1c MFr Bld: 6.2 % (ref 4.6–6.5)

## 2021-02-18 NOTE — Progress Notes (Signed)
Subjective:    Patient ID: Katherine Koch, female    DOB: 1946/02/12, 75 y.o.   MRN: [id number]  DOS:  02/18/2021 Type of visit - description: CPX  Since the last office visit he is doing okay. She developed a cyst of the right wrist, it is now symptomatic.  She also has pain at the base of the R thumb.  Also report dysphagia to dry solids episodically for the last year.  Symptoms are not consistent, they are not present if the solids are moist, no problems with fluids. No odynophagia. Denies nausea, vomiting, blood in the stools, weight loss.   Review of Systems  Other than above, a 14 point review of systems is negative      Past Medical History:  Diagnosis Date   Allergic rhinitis    Allergy    Arthritis    Dry eye syndrome    Heart murmur    Hyperlipemia    Hypertension    Osteopenia    per pt had a DEXA few years ago   Shingles 3/10    Past Surgical History:  Procedure Laterality Date   ABDOMINAL HYSTERECTOMY     B oophorectomy- remotely   BREAST EXCISIONAL BIOPSY Right    Social History   Socioeconomic History   Marital status: Single    Spouse name: Not on file   Number of children: 1   Years of education: Not on file   Highest education level: Not on file  Occupational History   Occupation: retired     Fish farm manager: RETIRED    Comment: [employer]  Tobacco Use   Smoking status: Former    Pack years: 0.00    Types: Cigarettes   Smokeless tobacco: Never   Tobacco comments:    quit 2011, 1/2 ppd , age of onset? smoked off-on  Substance and Sexual Activity   Alcohol use: No    Alcohol/week: 0.0 standard drinks   Drug use: No   Sexual activity: Not on file  Other Topics Concern   Not on file  Social History Narrative   Divorced, retired Air cabin crew    Daughter in [place] to [place] from [place] 11/09             Social Determinants of Health   Financial Resource Strain: Not on file  Food Insecurity: Not on file  Transportation Needs:  Not on file  Physical Activity: Not on file  Stress: Not on file  Social Connections: Not on file  Intimate Partner Violence: Not on file     Allergies as of 02/18/2021   No Known Allergies      Medication List        Accurate as of February 18, 2021 11:59 PM. If you have any questions, ask your nurse or doctor.          amLODipine 10 MG tablet Commonly known as: NORVASC Take 1 tablet (10 mg total) by mouth daily.   aspirin 81 MG tablet Take 81 mg by mouth daily.   azelastine 0.1 % nasal spray Commonly known as: ASTELIN Place 2 sprays into both nostrils 2 (two) times daily. Use in each nostril as directed   CALTRATE 600+D PO Take by mouth as directed.   chlorthalidone 25 MG tablet Commonly known as: HYGROTON TAKE 1 TABLET(25 MG) BY MOUTH DAILY   losartan 50 MG tablet Commonly known as: COZAAR Take 1 tablet (50 mg total) by mouth daily.   metoprolol tartrate 25 MG  tablet Commonly known as: LOPRESSOR TAKE 1/2 TABLET(12.5 MG) BY MOUTH DAILY   mometasone 50 MCG/ACT nasal spray Commonly known as: Nasonex Place 2 sprays into the nose daily.   Multivitamins Chew Chew 1 tablet by mouth daily.   potassium chloride SA 20 MEQ tablet Commonly known as: KLOR-CON Take 1 tablet (20 mEq total) by mouth daily.   pravastatin 80 MG tablet Commonly known as: PRAVACHOL Take 1 tablet (80 mg total) by mouth daily.   spironolactone 25 MG tablet Commonly known as: ALDACTONE Take 0.5 tablets (12.5 mg total) by mouth daily.           Objective:   Physical Exam BP 138/78 (BP Location: Left Arm, Patient Position: Sitting, Cuff Size: Small)   Pulse 73   Temp 98.4 F (36.9 C) (Oral)   Resp 18   Ht 5\' 7"  (1.702 m)   Wt 153 lb (69.4 kg)   SpO2 97%   BMI 23.96 kg/m  General: Well developed, NAD, BMI noted Neck: No  thyromegaly  HEENT:  Normocephalic . Face symmetric, atraumatic Lungs:  CTA B Normal respiratory effort, no intercostal retractions, no accessory muscle  use. Heart: RRR,  no murmur.  Abdomen:  Not distended, soft, non-tender. No rebound or rigidity. Upper extremities:   right wrist synovial cyst noted. Lower extremities: no pretibial edema bilaterally  Skin: Exposed areas without rash. Not pale. Not jaundice Neurologic:  alert & oriented X3.  Speech normal, gait appropriate for age and unassisted Strength symmetric and appropriate for age.  Psych: Cognition and judgment appear intact.  Cooperative with normal attention span and concentration.  Behavior appropriate. No anxious or depressed appearing.     Assessment    Assessment  Hyperglycemia: A1C 6.0  (05-2016) HTN LVH per echo 2015, 2021 (LVH mild-moderate): Hyperlipidemia Osteopenia : 02-2010: T score -2.4;  Tscore -1.1 2015: Tscore -2.0 Allergic rhinitis Shingles ~2010 Dry eye syndrome Palpable abdominal aorta: US abdomen  12-2015: no AAA  PLAN: Here for CPX Hyperglycemia: Check A1c HTN: BP today satisfactory, continue amlodipine, chlorthalidone, losartan, metoprolol, potassium, Aldactone. High cholesterol: Based on last FLP, Pravachol dose increased to 80 mg, labs. Osteopenia: Recommend vitamin D supplements, check a bone density test. Dysphagia: Symptoms as described above, they are inconsistent, only with dry solids, no red flags.  We agreed on reassess in 6 months, consider further eval.  Recommend to chew her food well and always have a sip of fluids with solids. Wrist synovial cyst: Refer to Ortho RTC 6 months  In addition to CPX, we addressed her chronic medical problems also a new problem (dysphagia, synovial cyst).  This visit occurred during the SARS-CoV-2 public health emergency.  Safety protocols were in place, including screening questions prior to the visit, additional usage of staff PPE, and extensive cleaning of exam room while observing appropriate contact time as indicated for disinfecting solutions.

## 2021-02-18 NOTE — Patient Instructions (Addendum)
Get a flu shot this fall  Get a COVID booster this year at some point  Take vitamin D3 OTC, 2000 units every day   GO TO THE LAB : Get the blood work     [name], [place] back for a checkup in 6 months \   "Living will", "[place] of attorney": Advanced care planning  (If you already have a living will or healthcare power of attorney, please bring the copy to be scanned in your chart.)  Advance care planning is a process that supports adults in  understanding and sharing their preferences regarding future medical care.   The patient's preferences are recorded in documents called Advance Directives.    Advanced directives are completed (and can be modified at any time) while the patient is in full mental capacity.   The documentation should be available at all times to the patient, the family and the healthcare providers.  Bring in a copy to be scanned in your chart is an excellent idea and is recommended   This legal documents direct treatment decision making and/or appoint a surrogate to make the decision if the patient is not capable to do so.    Advance directives can be documented in many types of formats,  documents have names such as:  Lliving will  Durable power of attorney for healthcare (healthcare proxy or healthcare power of attorney)  Combined directives  Physician orders for life-sustaining treatment    More information at:  [email or web address]

## 2021-02-19 ENCOUNTER — Encounter: Payer: Self-pay | Admitting: Internal Medicine

## 2021-02-19 NOTE — Assessment & Plan Note (Signed)
--  Td 2021 - PNM and shingles : declined again -  Covid shots x 3, rec booster this year  -Recommend flu shot q fall  --No further paps, see previous entries. She does sees gyn -- [facility] [id number] (per [facility]) --[facility]: colonoscopy per patient aprox 2004,Cscope again 5-10; Cscope 6/ 2020 , next per GI   --diet,exercise: needs impeovment , counseled  --labs: CMP, FLP,CBC, A1c, --POA d/w pt

## 2021-02-19 NOTE — Assessment & Plan Note (Signed)
Here for CPX Hyperglycemia: Check A1c HTN: BP today satisfactory, continue amlodipine, chlorthalidone, losartan, metoprolol, potassium, Aldactone. High cholesterol: Based on last FLP, Pravachol dose increased to 80 mg, labs. Osteopenia: Recommend vitamin D supplements, check a bone density test. Dysphagia: Symptoms as described above, they are inconsistent, only with dry solids, no red flags.  We agreed on reassess in 6 months, consider further eval.  Recommend to chew her food well and always have a sip of fluids with solids. Wrist synovial cyst: Refer to Ortho RTC 6 months

## 2021-02-20 MED ORDER — EZETIMIBE 10 MG PO TABS: 10.0000 mg | ORAL_TABLET | Freq: Every day | ORAL | 1 refills | Status: DC

## 2021-02-20 NOTE — Addendum Note (Signed)
Addended byDamita Dunnings D on: 02/20/2021 09:02 AM   Modules accepted: Orders

## 2021-04-01 ENCOUNTER — Telehealth: Payer: Self-pay | Admitting: Internal Medicine

## 2021-04-01 NOTE — Telephone Encounter (Signed)
Left message for patient to call back and schedule Medicare Annual Wellness Visit (AWV) in office.   If not able to come in office, please offer to do virtually or by telephone.  Left office number and my jabber #[phone].  Due for AWVI  Please schedule at anytime with Nurse Health Advisor.   

## 2021-04-13 ENCOUNTER — Encounter: Payer: Self-pay | Admitting: Internal Medicine

## 2021-04-13 ENCOUNTER — Other Ambulatory Visit: Payer: Self-pay

## 2021-04-13 ENCOUNTER — Ambulatory Visit: Payer: Medicare Other | Admitting: Internal Medicine

## 2021-04-13 VITALS — BP 128/84 | HR 79 | Temp 98.0°F | Resp 16 | Ht 67.0 in | Wt 154.2 lb

## 2021-04-13 DIAGNOSIS — M1712 Unilateral primary osteoarthritis, left knee: Secondary | ICD-10-CM

## 2021-04-13 DIAGNOSIS — R1310 Dysphagia, unspecified: Secondary | ICD-10-CM

## 2021-04-13 DIAGNOSIS — Z09 Encounter for follow-up examination after completed treatment for conditions other than malignant neoplasm: Secondary | ICD-10-CM | POA: Diagnosis not present

## 2021-04-13 NOTE — Progress Notes (Signed)
Subjective:    Patient ID: Katherine Koch, female    DOB: 1946/02/09, 75 y.o.   MRN: [id number]  DOS:  04/13/2021 Type of visit - description: Acute She has a long history of left knee problems. Lately, when she is stands up for prolonged periods at times she developed pain and swelling of the knee. Sometimes has swelling around the ankle as well. Denies fever chills. No falls or injury Occasionally the knee gives out.  Neck  Also, history of dysphagia, had a bad episode this weekend after she ate. Request further evaluation.  Review of Systems See above   Past Medical History:  Diagnosis Date   Allergic rhinitis    Allergy    Arthritis    Dry eye syndrome    Heart murmur    Hyperlipemia    Hypertension    Osteopenia    per pt had a DEXA few years ago   Shingles 3/10    Past Surgical History:  Procedure Laterality Date   ABDOMINAL HYSTERECTOMY     B oophorectomy- remotely   BREAST EXCISIONAL BIOPSY Right     Allergies as of 04/13/2021   No Known Allergies      Medication List        Accurate as of April 13, 2021 11:59 PM. If you have any questions, ask your nurse or doctor.          amLODipine 10 MG tablet Commonly known as: NORVASC Take 1 tablet (10 mg total) by mouth daily.   aspirin 81 MG tablet Take 81 mg by mouth daily.   azelastine 0.1 % nasal spray Commonly known as: ASTELIN Place 2 sprays into both nostrils 2 (two) times daily. Use in each nostril as directed   CALTRATE 600+D PO Take by mouth as directed.   chlorthalidone 25 MG tablet Commonly known as: HYGROTON TAKE 1 TABLET(25 MG) BY MOUTH DAILY   ezetimibe 10 MG tablet Commonly known as: Zetia Take 1 tablet (10 mg total) by mouth daily.   losartan 50 MG tablet Commonly known as: COZAAR Take 1 tablet (50 mg total) by mouth daily.   metoprolol tartrate 25 MG tablet Commonly known as: LOPRESSOR TAKE 1/2 TABLET(12.5 MG) BY MOUTH DAILY   mometasone 50 MCG/ACT nasal  spray Commonly known as: Nasonex Place 2 sprays into the nose daily.   Multivitamins Chew Chew 1 tablet by mouth daily.   potassium chloride SA 20 MEQ tablet Commonly known as: KLOR-CON Take 1 tablet (20 mEq total) by mouth daily.   pravastatin 80 MG tablet Commonly known as: PRAVACHOL Take 1 tablet (80 mg total) by mouth daily.   spironolactone 25 MG tablet Commonly known as: ALDACTONE Take 0.5 tablets (12.5 mg total) by mouth daily.           Objective:   Physical Exam BP 128/84 (BP Location: Left Arm, Patient Position: Sitting, Cuff Size: Small)   Pulse 79   Temp 98 F (36.7 C) (Oral)   Resp 16   Ht '5\' 7"'$  (1.702 m)   Wt 154 lb 4 oz (70 kg)   SpO2 98%   BMI 24.16 kg/m  General:   Well developed, NAD, BMI noted. HEENT:  Normocephalic . Face symmetric, atraumatic Lower extremities: no pretibial edema bilaterally.  Calves symmetric R knee: Bony changes consistent with DJD, no effusion L knee: Bony changes consistent with DJD, small effusion?  Not red, no warm.  Range of motion: Pain triggered by hyper flexion. Skin: Not pale. Not jaundice  Neurologic:  alert & oriented X3.  Speech normal, gait appropriate for age and unassisted Psych--  Cognition and judgment appear intact.  Cooperative with normal attention span and concentration.  Behavior appropriate. No anxious or depressed appearing.      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  Having pain and swelling with standing. Suspect symptoms are related to DJD, we agreed that she will discuss issue with orthopedics, she has an appointment in approximately 2  weeks.  In the meantime, ice and a knee sleeve. L knee MRI 2014 1. Full-thickness chondral defect involving the central weight-bearing aspect of the medial femoral condyle. There is a possible chondral fragment in the medial gutter.  2. No evidence of meniscal tear.  3. Intact cruciate and collateral ligaments.  4. Probable incidental ganglion posteromedially with intraosseous extension into the tibia.    Dysphagia: Ongoing symptoms, referral sent to GI.  See AVS   This visit occurred during the SARS-CoV-2 public health emergency.  Safety protocols were in place, including screening questions prior to the visit, additional usage of staff PPE, and extensive cleaning of exam room while observing appropriate contact time as indicated for disinfecting solutions.

## 2021-04-13 NOTE — Patient Instructions (Addendum)
Please call the gastroenterology office at [phone] and ask to see Dr. Ardis Hughs or one of his assistants about your stomach problems.  Keep the appointment you have with orthopedics For knee pain you can use a knee sleeve and apply ice at bedtime  Recommend to proceed with the following vaccines at your pharmacy:  Covid #4 Shingrix (shingles) Flu shot this fall  At your last visit we ordered a bone density test- please call [facility] at [phone] to schedule an appointment.

## 2021-04-14 NOTE — Assessment & Plan Note (Signed)
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  Having pain and swelling with standing. Suspect symptoms are related to DJD, we agreed that she will discuss issue with orthopedics, she has an appointment in approximately 2 weeks.  In the meantime, ice and a knee sleeve. L knee MRI 2014 1. Full-thickness chondral defect involving the central weight-bearing aspect of the medial femoral condyle. There is a possible chondral fragment in the medial gutter.  2. No evidence of meniscal tear.  3. Intact cruciate and collateral ligaments.  4. Probable incidental ganglion posteromedially with intraosseous extension into the tibia.    Dysphagia: Ongoing symptoms, referral sent to GI.  See AVS

## 2021-05-07 ENCOUNTER — Other Ambulatory Visit: Payer: Self-pay | Admitting: Cardiovascular Disease

## 2021-05-07 ENCOUNTER — Ambulatory Visit: Payer: Medicare Other | Attending: Internal Medicine

## 2021-05-07 DIAGNOSIS — Z23 Encounter for immunization: Secondary | ICD-10-CM

## 2021-05-07 NOTE — Progress Notes (Signed)
   Covid-19 Vaccination Clinic  Name:  Katherine Koch    MRN: [id number] DOB: 04/02/1946  05/07/2021  [name] was observed post Covid-19 immunization for 15 minutes without incident. She was provided with Vaccine Information Sheet and instruction to access the V-Safe system.   [name] was instructed to call 911 with any severe reactions post vaccine: Difficulty breathing  Swelling of face and throat  A fast heartbeat  A bad rash all over body  Dizziness and weakness

## 2021-05-09 ENCOUNTER — Other Ambulatory Visit: Payer: Self-pay | Admitting: Cardiovascular Disease

## 2021-05-09 DIAGNOSIS — I10 Essential (primary) hypertension: Secondary | ICD-10-CM

## 2021-05-11 ENCOUNTER — Other Ambulatory Visit: Payer: Self-pay | Admitting: Cardiovascular Disease

## 2021-05-11 DIAGNOSIS — I10 Essential (primary) hypertension: Secondary | ICD-10-CM

## 2021-05-11 NOTE — Telephone Encounter (Signed)
Rx(s) sent to pharmacy electronically.  

## 2021-05-13 ENCOUNTER — Other Ambulatory Visit (HOSPITAL_BASED_OUTPATIENT_CLINIC_OR_DEPARTMENT_OTHER): Payer: Self-pay

## 2021-05-13 MED ORDER — COVID-19MRNA BIVAL VACC PFIZER 30 MCG/0.3ML IM SUSP
INTRAMUSCULAR | 0 refills | Status: DC
  Filled 2021-05-13: qty 0.3, 1d supply, fill #0

## 2021-05-14 ENCOUNTER — Encounter (HOSPITAL_BASED_OUTPATIENT_CLINIC_OR_DEPARTMENT_OTHER): Payer: Self-pay | Admitting: Cardiovascular Disease

## 2021-05-14 ENCOUNTER — Ambulatory Visit (HOSPITAL_BASED_OUTPATIENT_CLINIC_OR_DEPARTMENT_OTHER): Payer: Medicare Other | Admitting: Cardiovascular Disease

## 2021-05-14 ENCOUNTER — Other Ambulatory Visit: Payer: Self-pay

## 2021-05-14 DIAGNOSIS — E785 Hyperlipidemia, unspecified: Secondary | ICD-10-CM | POA: Diagnosis not present

## 2021-05-14 DIAGNOSIS — I517 Cardiomegaly: Secondary | ICD-10-CM

## 2021-05-14 DIAGNOSIS — I10 Essential (primary) hypertension: Secondary | ICD-10-CM | POA: Diagnosis not present

## 2021-05-14 HISTORY — DX: Cardiomegaly: I51.7

## 2021-05-14 NOTE — Assessment & Plan Note (Signed)
Blood pressure was elevated in the office but has been okay with her PCP and she thinks it is okay at home.  She is going to track her blood pressures for 1 month and bring in to follow-up with our pharmacist.  Continue amlodipine, chlorthalidone, losartan, and spironolactone.  She thinks it may be elevated because she drove to a new office and is having some pain in her left knee today.

## 2021-05-14 NOTE — Progress Notes (Signed)
Cardiology Office Note   Date:  05/14/2021   ID:  [name], DOB Dec 23, 1945, MRN [id number]  PCP:  Colon Branch, MD  Cardiologist:   Skeet Latch, MD   No chief complaint on file.    History of Present Illness: Katherine Koch is a 75 y.o. female with resistent hypertension, hyperlipidemia, and LVH who presents for follow up.  Katherine Koch was previously a patient of [name].  She was last seen 06/2016.  She had an echo 03/2014 that revealed LVEF 60-65% with grade 2 diastolic dysfunction.  She had severe left ventricular hypertrophy which was thought to be due to poorly controlled hypertension.  Her PCP recommended that she restart a statinbut was hesitant to start atorvastatin because she thinks this caused her mother to develop lymphoma.  She also is resistant to rosuvastatin as she is thinks this caused her to have yeast infections.  She did consent to restarting pravastatin, though this has not been effective at getting her to goal in the past.  At her last appointment her lipids were poorly controled but she wanted to work on diet and exercise. There was slight improvement on her labs 02/2021. Pravastatin was increased to 80 mg and Zetia was added. Today, she is feeling okay overall. She tries to exercise but is limited by left knee pain. Also, she endorses LE edema in her left LE attributable to her left knee. She has a cyst on her right wrist. At home she does have a blood pressure cuff, and reports her blood pressure has been stable at home. She denies any palpitations, chest pain, or shortness of breath. No lightheadedness, headaches, syncope, orthopnea, or PND.   Past Medical History:  Diagnosis Date   Allergic rhinitis    Allergy    Arthritis    Dry eye syndrome    Heart murmur    Hyperlipemia    Hypertension    LVH (left ventricular hypertrophy) 05/14/2021   Osteopenia    per pt had a DEXA few years ago   Shingles 3/10    Past Surgical History:  Procedure  Laterality Date   ABDOMINAL HYSTERECTOMY     B oophorectomy- remotely   BREAST EXCISIONAL BIOPSY Right      Current Outpatient Medications  Medication Sig Dispense Refill   amLODipine (NORVASC) 10 MG tablet Take 1 tablet (10 mg total) by mouth daily. 90 tablet 3   aspirin 81 MG tablet Take 81 mg by mouth daily.     azelastine (ASTELIN) 0.1 % nasal spray Place 2 sprays into both nostrils 2 (two) times daily. Use in each nostril as directed 30 mL 3   Calcium Carbonate-Vitamin D (CALTRATE 600+D PO) Take by mouth as directed.     chlorthalidone (HYGROTON) 25 MG tablet TAKE 1 TABLET(25 MG) BY MOUTH DAILY 90 tablet 3   COVID-19 mRNA bivalent vaccine, Pfizer, injection Inject into the muscle. 0.3 mL 0   ezetimibe (ZETIA) 10 MG tablet Take 1 tablet (10 mg total) by mouth daily. 90 tablet 1   losartan (COZAAR) 50 MG tablet Take 1 tablet (50 mg total) by mouth daily. 90 tablet 1   metoprolol tartrate (LOPRESSOR) 25 MG tablet TAKE 1/2 TABLET(12.5 MG) BY MOUTH DAILY 45 tablet 8   mometasone (NASONEX) 50 MCG/ACT nasal spray Place 2 sprays into the nose daily. 17 g 12   Multiple Vitamins-Minerals (MULTIVITAMINS) CHEW Chew 1 tablet by mouth daily.      potassium chloride SA (KLOR-CON) 20 MEQ tablet  TAKE 1 TABLET BY MOUTH DAILY 90 tablet 3   pravastatin (PRAVACHOL) 80 MG tablet Take 1 tablet (80 mg total) by mouth daily. 90 tablet 3   spironolactone (ALDACTONE) 25 MG tablet Take 0.5 tablets (12.5 mg total) by mouth 2 (two) times daily. Patient needs Office Visit for future refills 30 tablet 0   No current facility-administered medications for this visit.    Allergies:   Patient has no known allergies.    Social History:  The patient  reports that she has quit smoking. Her smoking use included cigarettes. She has never used smokeless tobacco. She reports that she does not drink alcohol and does not use drugs.   Family History:  The patient's family history includes Coronary artery disease in her  mother; Hypertension in her brother and mother; Non-Hodgkin's lymphoma in her mother.    ROS:   Please see the history of present illness. (+) Left knee pain (+) Left LE edema (+) Cyst, right wrist All other systems are reviewed and negative.    PHYSICAL EXAM: VS:  BP (!) 148/72 (BP Location: Left Arm, Patient Position: Sitting, Cuff Size: Normal)   Pulse 65   Ht 5\' 7"  (1.702 m)   Wt 158 lb (71.7 kg)   BMI 24.75 kg/m  , BMI Body mass index is 24.75 kg/m. GENERAL:  Well appearing HEENT: Pupils equal round and reactive, fundi not visualized, oral mucosa unremarkable NECK:  No jugular venous distention, waveform within normal limits, carotid upstroke brisk and symmetric, no bruits, no thyromegaly LYMPHATICS:  No cervical adenopathy LUNGS:  Clear to auscultation bilaterally HEART:  RRR.  PMI not displaced or sustained,S1 and S2 within normal limits, no S3, no S4, no clicks, no rubs, II/VI systolic murmurs ABD:  Flat, positive bowel sounds normal in frequency in pitch, no bruits, no rebound, no guarding, no midline pulsatile mass, no hepatomegaly, no splenomegaly EXT:  2 plus pulses throughout, trace left LE edema, no cyanosis no clubbing SKIN:  Cyst on right wrist. No rashes no nodules NEURO:  Cranial nerves II through XII grossly intact, motor grossly intact throughout PSYCH:  Cognitively intact, oriented to person place and time   EKG:   05/14/2021: Sinus rhythm. Rate 65 bpm. LVH with secondary repolarization abnormalities. 05/02/2020: Sinus rhythm.  Rate 66 bpm.  LVH with repolarization abnormality. 05/17/2018: Sinus rhythm.  Rate 72 bpm.  LVH with secondary repolarization of normalities. 06/29/17 demonstrates sinus rhythm.  Rate 65 bpm. LVH with secondary repolarization abnormalities.   Echo 02/20/2019: 1. The left ventricle has hyperdynamic systolic function, with an  ejection fraction of >65%. The cavity size was normal. There is mildly  increased left ventricular wall  thickness. Left ventricular diastolic  Doppler parameters are consistent with  impaired relaxation. No evidence of left ventricular regional wall motion  abnormalities.   2. The right ventricle has normal systolic function. The cavity was  normal. There is no increase in right ventricular wall thickness.   3. There is moderate mitral annular calcification present. No evidence of  mitral valve stenosis. No significant mitral regurgitation.   4. The aortic valve is tricuspid. Mild calcification of the aortic valve.  No stenosis of the aortic valve.   5. The aortic root is normal in size and structure.   6. There is mild dilatation of the ascending aorta measuring 38 mm.   7. Normal IVC size. No complete TR doppler jet so unable to estimate PA  systolic pressure.   8. No valvular cause for  murmur noted. With hyperdynamic LV function, may be flow murmur.   Echo 03/28/14: Study Conclusions  - Left ventricle: The cavity size was normal. Wall thickness was   increased in a pattern of severe LVH. Systolic function was   normal. The estimated ejection fraction was in the range of 60%   to 65%. Features are consistent with a pseudonormal left   ventricular filling pattern, with concomitant abnormal relaxation   and increased filling pressure (grade 2 diastolic dysfunction). - Mitral valve: Calcified annulus. Mildly thickened leaflets .   There was mild regurgitation. - Left atrium: The atrium was mildly dilated.  Recent Labs: 02/18/2021: ALT 24; BUN 13; Creatinine, Ser 0.82; Hemoglobin 14.2; Platelets 261.0; Potassium 4.0; Sodium 139    Lipid Panel    Component Value Date/Time   CHOL 212 (H) 02/18/2021 1032   TRIG 100.0 02/18/2021 1032   HDL 52.90 02/18/2021 1032   CHOLHDL 4 02/18/2021 1032   VLDL 20.0 02/18/2021 1032   LDLCALC 139 (H) 02/18/2021 1032   LDLCALC 139 (H) 09/23/2020 0938   LDLDIRECT 155.8 02/15/2012 1354      Wt Readings from Last 3 Encounters:  05/14/21 158 lb (71.7  kg)  04/13/21 154 lb 4 oz (70 kg)  02/18/21 153 lb (69.4 kg)      ASSESSMENT AND PLAN: Essential hypertension, benign Blood pressure was elevated in the office but has been okay with her PCP and she thinks it is okay at home.  She is going to track her blood pressures for 1 month and bring in to follow-up with our pharmacist.  Continue amlodipine, chlorthalidone, losartan, and spironolactone.  She thinks it may be elevated because she drove to a new office and is having some pain in her left knee today.  Hyperlipidemia Lipids are poorly controlled.  Appreciate [name] adding Katherine Koch.  She has been unable to tolerate other statins.  Continue pravastatin.  We did discuss getting a calcium score to better evaluate her underlying risk.  If she does have coronary calcium this would also allow her to be a candidate for a PCSK9 inhibitor or Inclisiran.  She would like to think about it and not proceed at this time.  She is going to have fasting lipids rechecked with her PCP soon.  LVH (left ventricular hypertrophy) She has significant voltage on EKG consistent with LVH.  On echo she only has mild LVH.  She has no heart failure symptoms and no evidence of an outflow tract murmur.  Therefore, I do not think she has HOCM.  Continue to monitor.    Current medicines are reviewed at length with the patient today.  The patient does not have concerns regarding medicines.  The following changes have been made:  no change  Labs/ tests ordered today include:   Orders Placed This Encounter  Procedures   AMB Referral to [facility] Pharm-D   EKG 12-Lead     Disposition:    FU with PharmD in 1 month. FU with Katherine Perrow C. Oval Linsey, MD, [facility] in 1 year    I,Mathew Stumpf,acting as a scribe for Skeet Latch, MD.,have documented all relevant documentation on the behalf of Skeet Latch, MD,as directed by  Skeet Latch, MD while in the presence of Skeet Latch, MD.  I, Chalfant Oval Linsey, MD have  reviewed all documentation for this visit.  The documentation of the exam, diagnosis, procedures, and orders on 05/14/2021 are all accurate and complete.   Signed, Katherine Koch C. Oval Linsey, MD, [facility]  05/14/2021 2:33  PM    [facility] patient

## 2021-05-14 NOTE — Progress Notes (Signed)
Cardiology Office Note   Date:  05/14/2021   ID:  Katherine Koch, DOB [birth year], MRN [id number]  PCP:  Colon Branch, MD  Cardiologist:   Skeet Latch, MD   No chief complaint on file.    History of Present Illness: Katherine Koch is a 75 y.o. female with resistent hypertension, hyperlipidemia, and LVH who presents for follow up.  Katherine Koch was previously a patient of [name].  She was last seen 06/2016.  She had an echo 03/2014 that revealed LVEF 60-65% with grade 2 diastolic dysfunction.  She had severe left ventricular hypertrophy which was thought to be due to poorly controlled hypertension.  Her PCP recommended that she restart a statinbut was hesitant to start atorvastatin because she thinks this caused her mother to develop lymphoma.  She also is resistant to rosuvastatin as she is thinks this caused her to have yeast infections.  She did consent to restarting pravastatin, though this has not been effective at getting her to goal in the past.  At her last appointment her lipids were poorly controled but she wanted to work on diet and exercise. There was slight improvement on her labs 02/2021. Pravastatin was increased to 80 mg and Zetia was added. Today, she is feeling okay overall. She tries to exercise but is limited by left knee pain. Also, she endorses LE edema in her left LE attributable to her left knee. She has a cyst on her right wrist. At home she does have a blood pressure cuff, and reports her blood pressure has been stable at home. She denies any palpitations, chest pain, or shortness of breath. No lightheadedness, headaches, syncope, orthopnea, or PND.   Past Medical History:  Diagnosis Date   Allergic rhinitis    Allergy    Arthritis    Dry eye syndrome    Heart murmur    Hyperlipemia    Hypertension    LVH (left ventricular hypertrophy) 05/14/2021   Osteopenia    per pt had a DEXA few years ago   Shingles 3/10    Past Surgical History:  Procedure  Laterality Date   ABDOMINAL HYSTERECTOMY     B oophorectomy- remotely   BREAST EXCISIONAL BIOPSY Right      Current Outpatient Medications  Medication Sig Dispense Refill   amLODipine (NORVASC) 10 MG tablet Take 1 tablet (10 mg total) by mouth daily. 90 tablet 3   aspirin 81 MG tablet Take 81 mg by mouth daily.     azelastine (ASTELIN) 0.1 % nasal spray Place 2 sprays into both nostrils 2 (two) times daily. Use in each nostril as directed 30 mL 3   Calcium Carbonate-Vitamin D (CALTRATE 600+D PO) Take by mouth as directed.     chlorthalidone (HYGROTON) 25 MG tablet TAKE 1 TABLET(25 MG) BY MOUTH DAILY 90 tablet 3   COVID-19 mRNA bivalent vaccine, Pfizer, injection Inject into the muscle. 0.3 mL 0   ezetimibe (ZETIA) 10 MG tablet Take 1 tablet (10 mg total) by mouth daily. 90 tablet 1   losartan (COZAAR) 50 MG tablet Take 1 tablet (50 mg total) by mouth daily. 90 tablet 1   metoprolol tartrate (LOPRESSOR) 25 MG tablet TAKE 1/2 TABLET(12.5 MG) BY MOUTH DAILY 45 tablet 8   mometasone (NASONEX) 50 MCG/ACT nasal spray Place 2 sprays into the nose daily. 17 g 12   Multiple Vitamins-Minerals (MULTIVITAMINS) CHEW Chew 1 tablet by mouth daily.      potassium chloride SA (KLOR-CON) 20 MEQ tablet  TAKE 1 TABLET BY MOUTH DAILY 90 tablet 3   pravastatin (PRAVACHOL) 80 MG tablet Take 1 tablet (80 mg total) by mouth daily. 90 tablet 3   spironolactone (ALDACTONE) 25 MG tablet Take 0.5 tablets (12.5 mg total) by mouth 2 (two) times daily. Patient needs Office Visit for future refills 30 tablet 0   No current facility-administered medications for this visit.    Allergies:   Patient has no known allergies.    Social History:  The patient  reports that she has quit smoking. Her smoking use included cigarettes. She has never used smokeless tobacco. She reports that she does not drink alcohol and does not use drugs.   Family History:  The patient's family history includes Coronary artery disease in her  mother; Hypertension in her brother and mother; Non-Hodgkin's lymphoma in her mother.    ROS:   Please see the history of present illness. (+) Left knee pain (+) Left LE edema (+) Cyst, right wrist All other systems are reviewed and negative.    PHYSICAL EXAM: VS:  BP (!) 148/72 (BP Location: Left Arm, Patient Position: Sitting, Cuff Size: Normal)   Pulse 65   Ht 5\' 7"  (1.702 m)   Wt 158 lb (71.7 kg)   BMI 24.75 kg/m  , BMI Body mass index is 24.75 kg/m. GENERAL:  Well appearing HEENT: Pupils equal round and reactive, fundi not visualized, oral mucosa unremarkable NECK:  No jugular venous distention, waveform within normal limits, carotid upstroke brisk and symmetric, no bruits, no thyromegaly LYMPHATICS:  No cervical adenopathy LUNGS:  Clear to auscultation bilaterally HEART:  RRR.  PMI not displaced or sustained,S1 and S2 within normal limits, no S3, no S4, no clicks, no rubs, II/VI systolic murmurs ABD:  Flat, positive bowel sounds normal in frequency in pitch, no bruits, no rebound, no guarding, no midline pulsatile mass, no hepatomegaly, no splenomegaly EXT:  2 plus pulses throughout, trace left LE edema, no cyanosis no clubbing SKIN:  Cyst on right wrist. No rashes no nodules NEURO:  Cranial nerves II through XII grossly intact, motor grossly intact throughout PSYCH:  Cognitively intact, oriented to person place and time   EKG:   05/14/2021: Sinus rhythm. Rate 65 bpm. LVH with secondary repolarization abnormalities. 05/02/2020: Sinus rhythm.  Rate 66 bpm.  LVH with repolarization abnormality. 05/17/2018: Sinus rhythm.  Rate 72 bpm.  LVH with secondary repolarization of normalities. 06/29/17 demonstrates sinus rhythm.  Rate 65 bpm. LVH with secondary repolarization abnormalities.   Echo 02/20/2019: 1. The left ventricle has hyperdynamic systolic function, with an  ejection fraction of >65%. The cavity size was normal. There is mildly  increased left ventricular wall  thickness. Left ventricular diastolic  Doppler parameters are consistent with  impaired relaxation. No evidence of left ventricular regional wall motion  abnormalities.   2. The right ventricle has normal systolic function. The cavity was  normal. There is no increase in right ventricular wall thickness.   3. There is moderate mitral annular calcification present. No evidence of  mitral valve stenosis. No significant mitral regurgitation.   4. The aortic valve is tricuspid. Mild calcification of the aortic valve.  No stenosis of the aortic valve.   5. The aortic root is normal in size and structure.   6. There is mild dilatation of the ascending aorta measuring 38 mm.   7. Normal IVC size. No complete TR doppler jet so unable to estimate PA  systolic pressure.   8. No valvular cause for  murmur noted. With hyperdynamic LV function, may be flow murmur.   Echo 03/28/14: Study Conclusions  - Left ventricle: The cavity size was normal. Wall thickness was   increased in a pattern of severe LVH. Systolic function was   normal. The estimated ejection fraction was in the range of 60%   to 65%. Features are consistent with a pseudonormal left   ventricular filling pattern, with concomitant abnormal relaxation   and increased filling pressure (grade 2 diastolic dysfunction). - Mitral valve: Calcified annulus. Mildly thickened leaflets .   There was mild regurgitation. - Left atrium: The atrium was mildly dilated.  Recent Labs: 02/18/2021: ALT 24; BUN 13; Creatinine, Ser 0.82; Hemoglobin 14.2; Platelets 261.0; Potassium 4.0; Sodium 139    Lipid Panel    Component Value Date/Time   CHOL 212 (H) 02/18/2021 1032   TRIG 100.0 02/18/2021 1032   HDL 52.90 02/18/2021 1032   CHOLHDL 4 02/18/2021 1032   VLDL 20.0 02/18/2021 1032   LDLCALC 139 (H) 02/18/2021 1032   LDLCALC 139 (H) 09/23/2020 0938   LDLDIRECT 155.8 02/15/2012 1354      Wt Readings from Last 3 Encounters:  05/14/21 158 lb (71.7  kg)  04/13/21 154 lb 4 oz (70 kg)  02/18/21 153 lb (69.4 kg)      ASSESSMENT AND PLAN: Essential hypertension, benign Blood pressure was elevated in the office but has been okay with her PCP and she thinks it is okay at home.  She is going to track her blood pressures for 1 month and bring in to follow-up with our pharmacist.  Continue amlodipine, chlorthalidone, losartan, and spironolactone.  She thinks it may be elevated because she drove to a new office and is having some pain in her left knee today.  Hyperlipidemia Lipids are poorly controlled.  Appreciate [name] adding Katherine Koch.  She has been unable to tolerate other statins.  Continue pravastatin.  We did discuss getting a calcium score to better evaluate her underlying risk.  If she does have coronary calcium this would also allow her to be a candidate for a PCSK9 inhibitor or Inclisiran.  She would like to think about it and not proceed at this time.  She is going to have fasting lipids rechecked with her PCP soon.  LVH (left ventricular hypertrophy) She has significant voltage on EKG consistent with LVH.  On echo she only has mild LVH.  She has no heart failure symptoms and no evidence of an outflow tract murmur.  Therefore, I do not think she has HOCM.  Continue to monitor.    Current medicines are reviewed at length with the patient today.  The patient does not have concerns regarding medicines.  The following changes have been made:  no change  Labs/ tests ordered today include:   Orders Placed This Encounter  Procedures   AMB Referral to [facility] Pharm-D   EKG 12-Lead     Disposition:    FU with PharmD in 1 month. FU with Tiffany C. Oval Linsey, MD, [facility] in 1 year    I,Mathew Stumpf,acting as a scribe for Skeet Latch, MD.,have documented all relevant documentation on the behalf of Skeet Latch, MD,as directed by  Skeet Latch, MD while in the presence of Skeet Latch, MD.  I, [place] Oval Linsey, MD have  reviewed all documentation for this visit.  The documentation of the exam, diagnosis, procedures, and orders on 05/14/2021 are all accurate and complete.   Signed, Tiffany C. Oval Linsey, MD, [facility]  05/14/2021 2:32  PM    [facility] patient

## 2021-05-14 NOTE — Assessment & Plan Note (Signed)
She has significant voltage on EKG consistent with LVH.  On echo she only has mild LVH.  She has no heart failure symptoms and no evidence of an outflow tract murmur.  Therefore, I do not think she has HOCM.  Continue to monitor.

## 2021-05-14 NOTE — Assessment & Plan Note (Signed)
Lipids are poorly controlled.  Appreciate [name] adding Chauncey Cruel.  She has been unable to tolerate other statins.  Continue pravastatin.  We did discuss getting a calcium score to better evaluate her underlying risk.  If she does have coronary calcium this would also allow her to be a candidate for a PCSK9 inhibitor or Inclisiran.  She would like to think about it and not proceed at this time.  She is going to have fasting lipids rechecked with her PCP soon.

## 2021-05-14 NOTE — Patient Instructions (Signed)
Medication Instructions:  Your physician recommends that you continue on your current medications as directed. Please refer to the Current Medication list given to you today.   *If you need a refill on your cardiac medications before your next appointment, please call your pharmacy*  Lab Work: NONE  Testing/Procedures: NONE  Follow-Up: At [facility], you and your health needs are our priority.  As part of our continuing mission to provide you with exceptional heart care, we have created designated Provider Care Teams.  These Care Teams include your primary Cardiologist (physician) and Advanced Practice Providers (APPs -  Physician Assistants and Nurse Practitioners) who all work together to provide you with the care you need, when you need it.  We recommend signing up for the patient portal called "MyChart".  Sign up information is provided on this After Visit Summary.  MyChart is used to connect with patients for Virtual Visits (Telemedicine).  Patients are able to view lab/test results, encounter notes, upcoming appointments, etc.  Non-urgent messages can be sent to your provider as well.   To learn more about what you can do with MyChart, go to [email or web address].    Your next appointment:   12 month(s)  The format for your next appointment:   In Person  Provider:   Skeet Latch, MD  Your physician recommends that you schedule a follow-up appointment in 1 MONTH WITH PHARM D   Other Instructions MONITOR AND LOG YOUR BLOOD PRESSURE DAILY BRING MACHINE AND LOG TO YOUR FOLLOW UP

## 2021-05-15 ENCOUNTER — Encounter: Payer: Self-pay | Admitting: Physician Assistant

## 2021-05-15 ENCOUNTER — Ambulatory Visit: Payer: Medicare Other | Admitting: Physician Assistant

## 2021-05-15 VITALS — BP 120/70 | HR 72 | Ht 67.0 in | Wt 156.0 lb

## 2021-05-15 DIAGNOSIS — K30 Functional dyspepsia: Secondary | ICD-10-CM

## 2021-05-15 DIAGNOSIS — R1310 Dysphagia, unspecified: Secondary | ICD-10-CM | POA: Diagnosis not present

## 2021-05-15 DIAGNOSIS — R1110 Vomiting, unspecified: Secondary | ICD-10-CM

## 2021-05-15 NOTE — Progress Notes (Signed)
Subjective:    Patient ID: Katherine Koch, female    DOB: 05-Sep-1945, 75 y.o.   MRN: [id number]  HPI Katherine Koch is a pleasant 75 year old female, established with Dr. Ardis Koch, who comes in today referred by Dr. Larose Koch after office visit on 04/09/2021 with complaints of dysphagia indigestion and regurgitation. She had last been seen here in 2018.  She has history of hypertension, sleep apnea, hyperlipidemia, anxiety and had been seen for right upper quadrant discomfort in 2018. She underwent colonoscopy in June 2020 which was normal exam.  No prior EGD. Upper abdominal ultrasound in 2017 showed no evidence of cholelithiasis, possible adenomyomatosis, and possible 10 mm structure in the interpolar left kidney representing a nonobstructing stone.  Patient says when she was seen by Dr. Larose Koch she was having problems with indigestion, intermittent dysphagia with sensation of food "lodging" at times.  She would then bring up a lot of mucus or foamy liquid material with regurgitation but no food.  No nausea or vomiting.  She had been having increased belching and burping. She says she started drinking a probiotic drink a couple of weeks ago, in addition to following the instructions Dr. Larose Koch gave her to chew her food very carefully, cut her food up into small pieces and sip liquids between bites.  She says all of her symptoms have resolved at this point and she is not having any issues with dysphagia.  She denies any abdominal pain, has not had any changes in her bowel habits melena or hematochezia. She does not recall being put on any new medications around that time, has not had any recent antibiotics.    Review of Systems Pertinent positive and negative review of systems were noted in the above HPI section.  All other review of systems was otherwise negative.   Outpatient Encounter Medications as of 05/15/2021  Medication Sig   amLODipine (NORVASC) 10 MG tablet Take 1 tablet (10 mg total) by mouth daily.   aspirin  81 MG tablet Take 81 mg by mouth daily.   azelastine (ASTELIN) 0.1 % nasal spray Place 2 sprays into both nostrils 2 (two) times daily. Use in each nostril as directed   Calcium Carbonate-Vitamin D (CALTRATE 600+D PO) Take by mouth as directed.   chlorthalidone (HYGROTON) 25 MG tablet TAKE 1 TABLET(25 MG) BY MOUTH DAILY   ezetimibe (ZETIA) 10 MG tablet Take 1 tablet (10 mg total) by mouth daily.   losartan (COZAAR) 50 MG tablet Take 1 tablet (50 mg total) by mouth daily.   metoprolol tartrate (LOPRESSOR) 25 MG tablet TAKE 1/2 TABLET(12.5 MG) BY MOUTH DAILY   mometasone (NASONEX) 50 MCG/ACT nasal spray Place 2 sprays into the nose daily.   Multiple Vitamins-Minerals (MULTIVITAMINS) CHEW Chew 1 tablet by mouth daily.    potassium chloride SA (KLOR-CON) 20 MEQ tablet TAKE 1 TABLET BY MOUTH DAILY   pravastatin (PRAVACHOL) 80 MG tablet Take 1 tablet (80 mg total) by mouth daily.   spironolactone (ALDACTONE) 25 MG tablet Take 0.5 tablets (12.5 mg total) by mouth 2 (two) times daily. Patient needs Office Visit for future refills   [DISCONTINUED] COVID-19 mRNA bivalent vaccine, Pfizer, injection Inject into the muscle. (Patient not taking: Reported on 05/15/2021)   No facility-administered encounter medications on file as of 05/15/2021.   No Known Allergies Patient Active Problem List   Diagnosis Date Noted   LVH (left ventricular hypertrophy) 05/14/2021   PCP NOTES >>> 05/28/2015   OSA (obstructive sleep apnea) 08/23/2014   Dry eye syndrome  Annual physical exam 02/12/2011   ANXIETY STATE, UNSPECIFIED 06/10/2009   Allergic rhinitis 06/10/2009   Hyperlipidemia 06/18/2008   Essential hypertension, benign 06/18/2008   Osteopenia 06/18/2008   Social History   Socioeconomic History   Marital status: Single    Spouse name: Not on file   Number of children: 1   Years of education: Not on file   Highest education level: Not on file  Occupational History   Occupation: retired     Fish farm manager:  RETIRED    Comment: [employer]  Tobacco Use   Smoking status: Former    Types: Cigarettes   Smokeless tobacco: Never   Tobacco comments:    quit 2011, 1/2 ppd , age of onset? smoked off-on  Substance and Sexual Activity   Alcohol use: No    Alcohol/week: 0.0 standard drinks   Drug use: No   Sexual activity: Not on file  Other Topics Concern   Not on file  Social History Narrative   Divorced, retired Air cabin crew    Daughter in [place] to [place] from [place] 11/09             Social Determinants of Health   Financial Resource Strain: Not on file  Food Insecurity: Not on file  Transportation Needs: Not on file  Physical Activity: Not on file  Stress: Not on file  Social Connections: Not on file  Intimate Partner Violence: Not on file    Katherine Koch's family history includes Coronary artery disease in her mother; Hypertension in her brother and mother; Non-Hodgkin's lymphoma in her mother.      Objective:    Vitals:   05/15/21 1059  BP: 120/70  Pulse: 72  SpO2: 98%    Physical Exam Well-developed well-nourished elderly African-American female in no acute distress.  Height, Weight, 156 BMI 24.4  HEENT; nontraumatic normocephalic, EOMI, PE R LA, sclera anicteric. Oropharynx; not examined today Neck; supple, no JVD Cardiovascular; regular rate and rhythm with S1-S2, no murmur rub or gallop Pulmonary; Clear bilaterally Abdomen; soft, nontender, nondistended, no palpable mass or hepatosplenomegaly, bowel sounds are active Rectal; not done today Skin; benign exam, no jaundice rash or appreciable lesions Extremities; no clubbing cyanosis or edema skin warm and dry Neuro/Psych; alert and oriented x4, grossly nonfocal mood and affect appropriate        Assessment & Plan:   #66 75 year old female with recent episode of indigestion, belching, solid food dysphagia and regurgitation of foamy mucus.  Symptoms have pretty much resolved at this time. Is not  clear whether she had an episode of acute esophagitis, consider underlying motility disorder, rule out peptic stricture.  #2 colon cancer surveillance-up-to-date with negative colonoscopy June 2020 #3 hypertension 4.  Sleep apnea 5.  Hyperlipidemia 6.  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

## 2021-05-15 NOTE — Patient Instructions (Signed)
If you are age 75 or older, your body mass index should be between 23-30. Your Body mass index is 24.43 kg/m. If this is out of the aforementioned range listed, please consider follow up with your Primary Care Provider. __________________________________________________________  The [facility] providers would like to encourage you to use [facility] to communicate with providers for non-urgent requests or questions.  Due to long hold times on the telephone, sending your provider a message by [facility] may be a faster and more efficient way to get a response.  Please allow 48 business hours for a response.  Please remember that this is for non-urgent requests.   You have been scheduled for a Barium Esophogram at [facility] (1st floor of the hospital) on 05/25/2021 at 11:00 am. Please arrive 15 minutes prior to your appointment for registration. Make certain not to have anything to eat or drink 3 hours prior to your test. If you need to reschedule for any reason, please contact radiology at [phone] to do so. __________________________________________________________________ A barium swallow is an examination that concentrates on views of the esophagus. This tends to be a double contrast exam (barium and two liquids which, when combined, create a gas to distend the wall of the oesophagus) or single contrast (non-ionic iodine based). The study is usually tailored to your symptoms so a good history is essential. Attention is paid during the study to the form, structure and configuration of the esophagus, looking for functional disorders (such as aspiration, dysphagia, achalasia, motility and reflux) EXAMINATION You may be asked to change into a gown, depending on the type of swallow being performed. A radiologist and radiographer will perform the procedure. The radiologist will advise you of the type of contrast selected for your procedure and direct you during the exam. You will be asked to stand,  sit or lie in several different positions and to hold a small amount of fluid in your mouth before being asked to swallow while the imaging is performed .In some instances you may be asked to swallow barium coated marshmallows to assess the motility of a solid food bolus. The exam can be recorded as a digital or video fluoroscopy procedure. POST PROCEDURE It will take 1-2 days for the barium to pass through your system. To facilitate this, it is important, unless otherwise directed, to increase your fluids for the next 24-48hrs and to resume your normal diet.  This test typically takes about 30 minutes to perform. ____________________________________________________________  Try to follow a GERD diet.  Follow up pending the results of your Barium Swallow.  Thank you for entrusting me with your care and choosing [facility].  Amy Esterwood, PA-C

## 2021-05-18 ENCOUNTER — Telehealth: Payer: Self-pay

## 2021-05-18 DIAGNOSIS — D135 Benign neoplasm of extrahepatic bile ducts: Secondary | ICD-10-CM

## 2021-05-18 NOTE — Progress Notes (Signed)
I agree with the above note, plan 

## 2021-05-18 NOTE — Telephone Encounter (Signed)
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

## 2021-05-18 NOTE — Telephone Encounter (Signed)
Left voicemail for patient to call back. 

## 2021-05-19 NOTE — Telephone Encounter (Signed)
Patient has been scheduled for 05/28/2021 at 9:00 am.  Left patient a message to call back.

## 2021-05-19 NOTE — Telephone Encounter (Signed)
Patient has returned phone call. We discussed having the Ultrasound and previous findings from 2017. She is okay with proceeding with her ultrasound. I have advised her that I would give her a call once I have everything set up.  She has concerns of proceeding with the Barium Swallow. She is currently considering having it rescheduled to a later date or cancelling all together. I have advised that she can think about it and call me back.

## 2021-05-22 NOTE — Telephone Encounter (Signed)
Patient advised of appointment and given directions for imaging study.

## 2021-05-25 ENCOUNTER — Other Ambulatory Visit (HOSPITAL_COMMUNITY): Payer: Medicare Other

## 2021-05-28 ENCOUNTER — Other Ambulatory Visit: Payer: Self-pay

## 2021-05-28 ENCOUNTER — Ambulatory Visit (HOSPITAL_COMMUNITY)
Admission: RE | Admit: 2021-05-28 | Discharge: 2021-05-28 | Disposition: A | Discharge status: Home / Self Care | Payer: Medicare Other | Source: Ambulatory Visit | Attending: Physician Assistant | Admitting: Physician Assistant

## 2021-05-28 DIAGNOSIS — D135 Benign neoplasm of extrahepatic bile ducts: Secondary | ICD-10-CM

## 2021-06-04 ENCOUNTER — Telehealth: Payer: Self-pay | Admitting: Physician Assistant

## 2021-06-05 NOTE — Telephone Encounter (Signed)
Spoke with the patient. She is advised of her u/s results. She understands the plan is to repeat the u/s in 1 year to monitor the gallbladder polyps.  The patient also reports she is not longer having any issues with regurgitation of sensations of food in the esophagus. She is taking probiotics and she reports this has helped. 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

## 2021-06-08 ENCOUNTER — Other Ambulatory Visit: Payer: Self-pay | Admitting: Cardiovascular Disease

## 2021-06-11 ENCOUNTER — Ambulatory Visit: Payer: Medicare Other

## 2021-06-12 ENCOUNTER — Encounter: Payer: Self-pay | Admitting: Internal Medicine

## 2021-06-12 ENCOUNTER — Ambulatory Visit (INDEPENDENT_AMBULATORY_CARE_PROVIDER_SITE_OTHER): Payer: Medicare Other

## 2021-06-12 ENCOUNTER — Other Ambulatory Visit: Payer: Self-pay

## 2021-06-12 DIAGNOSIS — Z23 Encounter for immunization: Secondary | ICD-10-CM | POA: Diagnosis not present

## 2021-06-15 ENCOUNTER — Ambulatory Visit: Payer: Medicare Other

## 2021-06-22 ENCOUNTER — Telehealth: Payer: Self-pay | Admitting: Cardiovascular Disease

## 2021-06-22 MED ORDER — AMLODIPINE BESYLATE 10 MG PO TABS: 10.0000 mg | ORAL_TABLET | Freq: Every day | ORAL | 3 refills | Status: DC

## 2021-06-22 NOTE — Telephone Encounter (Signed)
New Message:      Patient says her last prescription for her Spironolactone was for the wrong amount.    *STAT* If patient is at the pharmacy, call can be transferred to refill team.   1. Which medications need to be refilled? (please list name of each medication and dose if known) Spironolactone  2. Which pharmacy/location (including street and city if local pharmacy) is medication to be sent to? [facility] [address], [place]  3. Do they need a 30 day or 90 day supply? #45 instead of #30

## 2021-06-22 NOTE — Telephone Encounter (Signed)
Rx(s) sent to pharmacy electronically.  

## 2021-07-01 ENCOUNTER — Other Ambulatory Visit: Payer: Self-pay | Admitting: Cardiovascular Disease

## 2021-07-08 ENCOUNTER — Telehealth: Payer: Self-pay | Admitting: Internal Medicine

## 2021-07-08 NOTE — Telephone Encounter (Signed)
Left message for patient to call back and schedule Medicare Annual Wellness Visit (AWV) in office.  ° °If not able to come in office, please offer to do virtually or by telephone.  Left office number and my jabber #[phone]. ° °Due for AWVI ° °Please schedule at anytime with Nurse Health Advisor. °  °

## 2021-08-11 ENCOUNTER — Ambulatory Visit: Payer: Medicare Other

## 2021-08-14 ENCOUNTER — Other Ambulatory Visit: Payer: Self-pay | Admitting: Cardiovascular Disease

## 2021-08-21 ENCOUNTER — Encounter: Payer: Self-pay | Admitting: Internal Medicine

## 2021-08-21 ENCOUNTER — Ambulatory Visit: Payer: Medicare Other | Admitting: Internal Medicine

## 2021-08-21 VITALS — BP 132/70 | HR 70 | Temp 98.3°F | Resp 16 | Ht 67.0 in | Wt 151.2 lb

## 2021-08-21 DIAGNOSIS — Z1159 Encounter for screening for other viral diseases: Secondary | ICD-10-CM

## 2021-08-21 DIAGNOSIS — R739 Hyperglycemia, unspecified: Secondary | ICD-10-CM | POA: Diagnosis not present

## 2021-08-21 DIAGNOSIS — Q441 Other congenital malformations of gallbladder: Secondary | ICD-10-CM

## 2021-08-21 DIAGNOSIS — E785 Hyperlipidemia, unspecified: Secondary | ICD-10-CM | POA: Diagnosis not present

## 2021-08-21 DIAGNOSIS — R051 Acute cough: Secondary | ICD-10-CM

## 2021-08-21 DIAGNOSIS — I10 Essential (primary) hypertension: Secondary | ICD-10-CM

## 2021-08-21 DIAGNOSIS — J069 Acute upper respiratory infection, unspecified: Secondary | ICD-10-CM | POA: Diagnosis not present

## 2021-08-21 DIAGNOSIS — Z1231 Encounter for screening mammogram for malignant neoplasm of breast: Secondary | ICD-10-CM

## 2021-08-21 DIAGNOSIS — R1310 Dysphagia, unspecified: Secondary | ICD-10-CM

## 2021-08-21 LAB — BASIC METABOLIC PANEL
BUN: 14 mg/dL (ref 6–23)
CO2: 29 mEq/L (ref 19–32)
Calcium: 9.9 mg/dL (ref 8.4–10.5)
Chloride: 101 mEq/L (ref 96–112)
Creatinine, Ser: 0.76 mg/dL (ref 0.40–1.20)
GFR: 76.61 mL/min (ref >60.00)
Glucose, Bld: 92 mg/dL (ref 70–99)
Potassium: 4.3 mEq/L (ref 3.5–5.1)
Sodium: 138 mEq/L (ref 135–145)

## 2021-08-21 LAB — LIPID PANEL
Cholesterol: 152 mg/dL (ref 0–200)
HDL: 47.5 mg/dL (ref >39.00)
LDL Cholesterol: 89 mg/dL (ref 0–99)
NonHDL: 104.19
Total CHOL/HDL Ratio: 3
Triglycerides: 74 mg/dL (ref 0.0–149.0)
VLDL: 14.8 mg/dL (ref 0.0–40.0)

## 2021-08-21 LAB — HEMOGLOBIN A1C: Hgb A1c MFr Bld: 6.1 % (ref 4.6–6.5)

## 2021-08-21 LAB — POC COVID19 BINAXNOW: SARS Coronavirus 2 Ag: NEGATIVE

## 2021-08-21 NOTE — Progress Notes (Signed)
Subjective:    Patient ID: Katherine Koch, female    DOB: 09-23-1945, 76 y.o.   MRN: [id number]  DOS:  08/21/2021 Type of visit - description: f/u  Since the last office visit saw GI, notes reviewed. Reports that she is "coming down with something". For the last 2 days is experiencing a dry cough, muscle aches at the upper back. Denies fever chills No runny nose or sore throat Some chest congestion. No nausea or vomiting   Review of Systems See above   Past Medical History:  Diagnosis Date   Allergic rhinitis    Allergy    Arthritis    Dry eye syndrome    Heart murmur    Hyperlipemia    Hypertension    LVH (left ventricular hypertrophy) 05/14/2021   Osteopenia    per pt had a DEXA few years ago   Shingles 3/10    Past Surgical History:  Procedure Laterality Date   ABDOMINAL HYSTERECTOMY     B oophorectomy- remotely   BREAST EXCISIONAL BIOPSY Right     Current Outpatient Medications  Medication Instructions   amLODipine (NORVASC) 10 MG tablet TAKE 1 TABLET(10 MG) BY MOUTH DAILY   aspirin 81 mg, Oral, Daily,     azelastine (ASTELIN) 0.1 % nasal spray 2 sprays, Each Nare, 2 times daily, Use in each nostril as directed   Calcium Carbonate-Vitamin D (CALTRATE 600+D PO) Oral, As directed,     chlorthalidone (HYGROTON) 25 MG tablet TAKE 1 TABLET(25 MG) BY MOUTH DAILY   ezetimibe (ZETIA) 10 mg, Oral, Daily   losartan (COZAAR) 50 MG tablet TAKE 1 TABLET(50 MG) BY MOUTH DAILY   metoprolol tartrate (LOPRESSOR) 25 MG tablet TAKE 1/2 TABLET(12.5 MG) BY MOUTH DAILY   mometasone (NASONEX) 50 MCG/ACT nasal spray 2 sprays, Nasal, Daily   Multiple Vitamins-Minerals (MULTIVITAMINS) CHEW 1 tablet, Oral, Daily   potassium chloride SA (KLOR-CON) 20 MEQ tablet TAKE 1 TABLET BY MOUTH DAILY   pravastatin (PRAVACHOL) 80 mg, Oral, Daily   spironolactone (ALDACTONE) 25 MG tablet TAKE 1/2 TABLET(12.5 MG) BY MOUTH TWICE DAILY       Objective:   Physical Exam BP 132/70 (BP Location: Left  Arm, Patient Position: Sitting, Cuff Size: Small)    Pulse 70    Temp 98.3 F (36.8 C) (Oral)    Resp 16    Ht 5\' 7"  (1.702 m)    Wt 151 lb 4 oz (68.6 kg)    SpO2 97%    BMI 23.69 kg/m  General: Well developed, NAD, BMI noted Neck: No  thyromegaly  HEENT:  Normocephalic . Face symmetric, atraumatic.  Nose congested Lungs:  CTA B Normal respiratory effort, no intercostal retractions, no accessory muscle use. Heart: RRR,  no murmur.  Abdomen:  Not distended, soft, non-tender. No rebound or rigidity.   Lower extremities: no pretibial edema bilaterally  Skin: Exposed areas without rash. Not pale. Not jaundice Neurologic:  alert & oriented X3.  Speech normal, gait appropriate for age and unassisted Strength symmetric and appropriate for age.  Psych: Cognition and judgment appear intact.  Cooperative with normal attention span and concentration.  Behavior appropriate. No anxious or depressed appearing.     Assessment    Assessment  Hyperglycemia: A1C 6.0  (05-2016) HTN LVH per echo 2015, 2021 (LVH mild-moderate): Hyperlipidemia Osteopenia : 02-2010: T score -2.4;  Tscore -1.1 2015: Tscore -2.0 Allergic rhinitis Shingles ~2010 Dry eye syndrome  Palpable abdominal aorta: US abdomen  12-2015: no AAA Gallbladder adenomatosis: Next  [place] 05-2022   PLAN: Hyperglycemia: Check A1c, diet and exercise recommended. HTN: BP today is 132/70, at home is even better in the 120/70.  Continue amlodipine, chlorthalidone, losartan,metoprolol, potassium, Aldactone.  Check BMP.   Hyperlipidemia: Last LDL 139 on Pravachol, declined to switch to atorva befores thus added zetia. Labs. Dysphagia: Saw GI 05/15/2021, at that time dysphagia symptoms resolved, they Rx barium swallow, not pursue bt patient. 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  COVID test today: Negative, recommend conservative treatment, see AVS.  Call if worse. MMG -DEXA ordered before, encouraged to set it up. RTC 6 months    This visit occurred during the SARS-CoV-2 public health emergency.  Safety protocols were in place, including screening questions prior to the visit, additional usage of staff PPE, and extensive cleaning of exam room while observing appropriate contact time as indicated for disinfecting solutions.

## 2021-08-21 NOTE — Patient Instructions (Addendum)
We have ordered a mammogram for you. Please stop downstairs ([address]) to schedule that at your convenience.   At your last visit on 02/18/2021 we ordered a bone density test. Radiology can also schedule this and hopefully can be done at the same time as your mammogram.   ===   Check the  blood pressure regularly BP GOAL is between 110/65 and  135/85. If it is consistently higher or lower, let me know  For your URI or cold  Take Mucinex DM or Robitussin-DM OTC.  Follow the instructions in the box.   For nasal congestion: -Use over-the-counter Flonase: 2 nasal sprays on each side of the nose in the morning until you feel better  -Use OTC Astepro 2 nasal sprays on each side of the nose twice daily until better  Avoid decongestants such as  Pseudoephedrine or phenylephrine      Call if not gradually better over the next  10 days   =====  GO TO THE LAB : Get the blood work     GO TO THE FRONT DESK, [place] back for for a physical exam in 6 months

## 2021-08-23 NOTE — Assessment & Plan Note (Signed)
Hyperglycemia: Check A1c, diet and exercise recommended. HTN: BP today is 132/70, at home is even better in the 120/70.  Continue amlodipine, chlorthalidone, losartan,metoprolol, potassium, Aldactone.  Check BMP.   Hyperlipidemia: Last LDL 139 on Pravachol, declined to switch to atorva befores thus added zetia. Labs. Dysphagia: Saw GI 05/15/2021, at that time dysphagia symptoms resolved, they Rx barium swallow, not pursue bt patient. Gallbladder adenomatosis: GI recommended [place], done 05-2021, it confirmed a diagnosis, next [place] 1 year per GI URI: Symptoms a started 2 days ago, physical exam is benign except for some nasal congestion and a mild cough.  COVID test today: Negative, recommend conservative treatment, see AVS.  Call if worse. MMG -DEXA ordered before, encouraged to set it up. RTC 6 months

## 2021-08-28 ENCOUNTER — Telehealth: Payer: Self-pay | Admitting: Internal Medicine

## 2021-08-28 MED ORDER — AZELASTINE HCL 0.1 % NA SOLN: 2.0000 | Freq: Two times a day (BID) | NASAL | 6 refills | Status: AC

## 2021-08-28 NOTE — Telephone Encounter (Signed)
Pt would like a new prescription written for nasal spray. Pt stated Katherine Koch is aware. Please advise.

## 2021-08-28 NOTE — Telephone Encounter (Signed)
Send Astepro 2 sprays on each side of the nose twice daily as needed.  3 refills.

## 2021-08-28 NOTE — Telephone Encounter (Signed)
Please advise 

## 2021-08-28 NOTE — Telephone Encounter (Signed)
Rx sent 

## 2021-09-08 ENCOUNTER — Telehealth (HOSPITAL_BASED_OUTPATIENT_CLINIC_OR_DEPARTMENT_OTHER): Payer: Self-pay

## 2021-09-18 ENCOUNTER — Telehealth: Payer: Self-pay | Admitting: Internal Medicine

## 2021-09-18 NOTE — Telephone Encounter (Signed)
Left message for patient to call back and schedule Medicare Annual Wellness Visit (AWV) in office.  ° °If not able to come in office, please offer to do virtually or by telephone.  Left office number and my jabber #[phone]. ° °Due for AWVI ° °Please schedule at anytime with Nurse Health Advisor. °  °

## 2021-09-30 ENCOUNTER — Ambulatory Visit: Payer: Medicare Other | Admitting: Internal Medicine

## 2021-09-30 VITALS — BP 135/70 | HR 66 | Temp 98.5°F | Resp 16 | Ht 67.0 in | Wt 152.4 lb

## 2021-09-30 DIAGNOSIS — J069 Acute upper respiratory infection, unspecified: Secondary | ICD-10-CM | POA: Diagnosis not present

## 2021-09-30 DIAGNOSIS — J029 Acute pharyngitis, unspecified: Secondary | ICD-10-CM

## 2021-09-30 LAB — POCT RAPID STREP A (OFFICE): Rapid Strep A Screen: NEGATIVE

## 2021-09-30 MED ORDER — FLUTICASONE PROPIONATE 50 MCG/ACT NA SUSP: 2.0000 | Freq: Every day | NASAL | 6 refills | Status: DC

## 2021-09-30 NOTE — Patient Instructions (Signed)
°  Rest, fluids , tylenol  If  cough:  Take Mucinex DM or Robitussin-DM OTC.  Follow the instructions in the box.   For nasal congestion: -Use over-the-counter Flonase: 2 nasal sprays on each side of the nose in the morning until you feel better  -Use OTC Astepro 2 nasal sprays on each side of the nose twice daily until better  Avoid decongestants such as  Pseudoephedrine or phenylephrine     Call if not gradually better over the next  10 days

## 2021-09-30 NOTE — Progress Notes (Signed)
Subjective:    Patient ID: Katherine Koch, female    DOB: 06-Aug-1946, 76 y.o.   MRN: [id number]  DOS:  09/30/2021 Type of visit - description: Acute  Symptoms a started 5 days ago: Swelling and pain at the sub-maxillary glands, bilateral ear ache, pain "at the back of the tongue".  Denies fever chills.  No weight loss. I ask about sinus pain congestion and postnasal dripping: "I always have that". No nausea or vomiting.  No myalgias Cough at baseline, she has mild chronic cough due to PND  Review of Systems See above   Past Medical History:  Diagnosis Date   Allergic rhinitis    Allergy    Arthritis    Dry eye syndrome    Heart murmur    Hyperlipemia    Hypertension    LVH (left ventricular hypertrophy) 05/14/2021   Osteopenia    per pt had a DEXA few years ago   Shingles 3/10    Past Surgical History:  Procedure Laterality Date   ABDOMINAL HYSTERECTOMY     B oophorectomy- remotely   BREAST EXCISIONAL BIOPSY Right     Current Outpatient Medications  Medication Instructions   amLODipine (NORVASC) 10 MG tablet TAKE 1 TABLET(10 MG) BY MOUTH DAILY   aspirin 81 mg, Oral, Daily,     azelastine (ASTELIN) 0.1 % nasal spray 2 sprays, Each Nare, 2 times daily, Use in each nostril as directed   Calcium Carbonate-Vitamin D (CALTRATE 600+D PO) Oral, As directed,     chlorthalidone (HYGROTON) 25 MG tablet TAKE 1 TABLET(25 MG) BY MOUTH DAILY   ezetimibe (ZETIA) 10 mg, Oral, Daily   fluticasone (FLONASE) 50 MCG/ACT nasal spray 2 sprays, Each Nare, Daily   losartan (COZAAR) 50 MG tablet TAKE 1 TABLET(50 MG) BY MOUTH DAILY   metoprolol tartrate (LOPRESSOR) 25 MG tablet TAKE 1/2 TABLET(12.5 MG) BY MOUTH DAILY   mometasone (NASONEX) 50 MCG/ACT nasal spray 2 sprays, Nasal, Daily   Multiple Vitamins-Minerals (MULTIVITAMINS) CHEW 1 tablet, Oral, Daily   potassium chloride SA (KLOR-CON) 20 MEQ tablet TAKE 1 TABLET BY MOUTH DAILY   pravastatin (PRAVACHOL) 80 mg, Oral, Daily    spironolactone (ALDACTONE) 25 MG tablet TAKE 1/2 TABLET(12.5 MG) BY MOUTH TWICE DAILY       Objective:   Physical Exam BP 135/70 (BP Location: Right Arm, Patient Position: Sitting, Cuff Size: Normal)    Pulse 66    Temp 98.5 F (36.9 C) (Oral)    Resp 16    Ht 5\' 7"  (1.702 m)    Wt 152 lb 6.4 oz (69.1 kg)    SpO2 97%    BMI 23.87 kg/m  General:   Well developed, NAD, BMI noted. HEENT:  Normocephalic . Face symmetric, atraumatic. TMs normal Nose slightly congested Throat: Slightly red, no white patches, tonsils not enlarged, no ulcers, tongue seems normal. Neck: No thyromegaly Bilateral submandibular glands are slightly tender Lungs:  CTA B Normal respiratory effort, no intercostal retractions, no accessory muscle use. Heart: RRR,  no murmur.  Lower extremities: no pretibial edema bilaterally  Skin: Not pale. Not jaundice Neurologic:  alert & oriented X3.  Speech normal, gait appropriate for age and unassisted Psych--  Cognition and judgment appear intact.  Cooperative with normal attention span and concentration.  Behavior appropriate. No anxious or depressed appearing.      Assessment     Assessment  Hyperglycemia: A1C 6.0  (05-2016) HTN LVH per echo 2015, 2021 (LVH mild-moderate): Hyperlipidemia Osteopenia : 02-2010: T score -  2.4;  Tscore -1.1 2015: Tscore -2.0 Allergic rhinitis Shingles ~2010 Dry eye syndrome  Palpable abdominal aorta: US abdomen  12-2015: no AAA Gallbladder adenomatosis: Next [place] 05-2022   PLAN: URI: Having slightly tender glands on the submaxillary area, thyroid exam is normal, no fever.  Suspect a URI. Recommend rest, fluids, Tylenol. Also Mucinex DM as needed for cough. For nasal congestion: Flonase, Astepro.  Avoid decongestants.  See AVS.     This visit occurred during the SARS-CoV-2 public health emergency.  Safety protocols were in place, including screening questions prior to the visit, additional usage of staff PPE, and extensive  cleaning of exam room while observing appropriate contact time as indicated for disinfecting solutions.

## 2021-10-01 NOTE — Assessment & Plan Note (Signed)
URI: Having slightly tender glands on the submaxillary area, thyroid exam is normal, no fever.  Suspect a URI. Recommend rest, fluids, Tylenol. Also Mucinex DM as needed for cough. For nasal congestion: Flonase, Astepro.  Avoid decongestants.  See AVS.

## 2021-10-02 LAB — CULTURE, GROUP A STREP
MICRO NUMBER:: 13012406
SPECIMEN QUALITY:: ADEQUATE

## 2021-10-22 ENCOUNTER — Other Ambulatory Visit: Payer: Self-pay | Admitting: Internal Medicine

## 2021-11-03 ENCOUNTER — Ambulatory Visit (HOSPITAL_BASED_OUTPATIENT_CLINIC_OR_DEPARTMENT_OTHER): Payer: Medicare Other | Admitting: Cardiovascular Disease

## 2021-11-23 ENCOUNTER — Other Ambulatory Visit: Payer: Self-pay | Admitting: Cardiovascular Disease

## 2021-12-23 ENCOUNTER — Telehealth: Payer: Self-pay | Admitting: Internal Medicine

## 2021-12-23 NOTE — Telephone Encounter (Signed)
Left message for patient to call back and schedule Medicare Annual Wellness Visit (AWV).  ? ?Please offer to do virtually or by telephone.  Left office number and my jabber [phone]. ? ?Last AWV:03/21/2014 ? ?Please schedule at anytime with Nurse Health Advisor. ?  ?

## 2022-02-11 ENCOUNTER — Ambulatory Visit
Admission: RE | Admit: 2022-02-11 | Discharge: 2022-02-11 | Disposition: A | Discharge status: Home / Self Care | Payer: Medicare Other | Source: Ambulatory Visit | Attending: Internal Medicine | Admitting: Internal Medicine

## 2022-02-19 ENCOUNTER — Ambulatory Visit (INDEPENDENT_AMBULATORY_CARE_PROVIDER_SITE_OTHER): Payer: Medicare Other | Admitting: Internal Medicine

## 2022-02-19 ENCOUNTER — Encounter: Payer: Self-pay | Admitting: Internal Medicine

## 2022-02-19 VITALS — BP 118/72 | HR 70 | Temp 97.9°F | Resp 18 | Ht 67.0 in | Wt 151.0 lb

## 2022-02-19 DIAGNOSIS — R739 Hyperglycemia, unspecified: Secondary | ICD-10-CM

## 2022-02-19 DIAGNOSIS — I10 Essential (primary) hypertension: Secondary | ICD-10-CM | POA: Diagnosis not present

## 2022-02-19 DIAGNOSIS — Z0000 Encounter for general adult medical examination without abnormal findings: Secondary | ICD-10-CM

## 2022-02-19 DIAGNOSIS — E785 Hyperlipidemia, unspecified: Secondary | ICD-10-CM

## 2022-02-19 LAB — CBC WITH DIFFERENTIAL/PLATELET
Basophils Absolute: 0 10*3/uL (ref 0.0–0.1)
Basophils Relative: 0.9 % (ref 0.0–3.0)
Eosinophils Absolute: 0.1 10*3/uL (ref 0.0–0.7)
Eosinophils Relative: 3.6 % (ref 0.0–5.0)
HCT: 41 % (ref 36.0–46.0)
Hemoglobin: 13.6 g/dL (ref 12.0–15.0)
Lymphocytes Relative: 27.2 % (ref 12.0–46.0)
Lymphs Abs: 1 10*3/uL (ref 0.7–4.0)
MCHC: 33.2 g/dL (ref 30.0–36.0)
MCV: 88 fl (ref 78.0–100.0)
Monocytes Absolute: 0.3 10*3/uL (ref 0.1–1.0)
Monocytes Relative: 8.3 % (ref 3.0–12.0)
Neutro Abs: 2.3 10*3/uL (ref 1.4–7.7)
Neutrophils Relative %: 60 % (ref 43.0–77.0)
Platelets: 253 10*3/uL (ref 150.0–400.0)
RBC: 4.66 Mil/uL (ref 3.87–5.11)
RDW: 12.9 % (ref 11.5–15.5)
WBC: 3.8 10*3/uL — ABNORMAL LOW (ref 4.0–10.5)

## 2022-02-19 LAB — COMPREHENSIVE METABOLIC PANEL
ALT: 23 U/L (ref 0–35)
AST: 21 U/L (ref 0–37)
Albumin: 4.8 g/dL (ref 3.5–5.2)
Alkaline Phosphatase: 53 U/L (ref 39–117)
BUN: 17 mg/dL (ref 6–23)
CO2: 28 mEq/L (ref 19–32)
Calcium: 10 mg/dL (ref 8.4–10.5)
Chloride: 101 mEq/L (ref 96–112)
Creatinine, Ser: 0.7 mg/dL (ref 0.40–1.20)
GFR: 84.26 mL/min (ref >60.00)
Glucose, Bld: 90 mg/dL (ref 70–99)
Potassium: 3.8 mEq/L (ref 3.5–5.1)
Sodium: 138 mEq/L (ref 135–145)
Total Bilirubin: 0.8 mg/dL (ref 0.2–1.2)
Total Protein: 7.4 g/dL (ref 6.0–8.3)

## 2022-02-19 LAB — LIPID PANEL
Cholesterol: 162 mg/dL (ref 0–200)
HDL: 52.3 mg/dL (ref >39.00)
LDL Cholesterol: 93 mg/dL (ref 0–99)
NonHDL: 110.04
Total CHOL/HDL Ratio: 3
Triglycerides: 87 mg/dL (ref 0.0–149.0)
VLDL: 17.4 mg/dL (ref 0.0–40.0)

## 2022-02-19 LAB — HEMOGLOBIN A1C: Hgb A1c MFr Bld: 6.1 % (ref 4.6–6.5)

## 2022-02-19 NOTE — Patient Instructions (Addendum)
Recommend to proceed with covid booster (bivalent) at your pharmacy.   Recommend also a flu shot this fall  Please read the information about healthcare power of attorney   Check the  blood pressure regularly BP GOAL is between 110/65 and  135/85. If it is consistently higher or lower, let me know    GO TO THE LAB : Get the blood work     [place], [place] back for   a checkup in 6 months

## 2022-02-19 NOTE — Progress Notes (Unsigned)
Subjective:    Patient ID: Katherine Koch, female    DOB: [birth year], 76 y.o.   MRN: [id number]  DOS:  02/19/2022 Type of visit - description: cpx  Since the last office visit is doing well. Has actually no concerns.     Review of Systems   A 14 point review of systems is negative    Past Medical History:  Diagnosis Date   Allergic rhinitis    Allergy    Arthritis    Dry eye syndrome    Heart murmur    Hyperlipemia    Hypertension    LVH (left ventricular hypertrophy) 05/14/2021   Osteopenia    per pt had a DEXA few years ago   Shingles 3/10    Past Surgical History:  Procedure Laterality Date   ABDOMINAL HYSTERECTOMY     B oophorectomy- remotely   BREAST EXCISIONAL BIOPSY Right    Social History   Socioeconomic History   Marital status: Single    Spouse name: Not on file   Number of children: 1   Years of education: Not on file   Highest education level: Not on file  Occupational History   Occupation: retired     Fish farm manager: RETIRED    Comment: [employer]  Tobacco Use   Smoking status: Former    Types: Cigarettes   Smokeless tobacco: Never   Tobacco comments:    quit 2011, 1/2 ppd , age of onset? smoked off-on  Substance and Sexual Activity   Alcohol use: No    Alcohol/week: 0.0 standard drinks of alcohol   Drug use: No   Sexual activity: Not on file  Other Topics Concern   Not on file  Social History Narrative   Divorced, retired Air cabin crew    Daughter in [place], has a 65 y/o g-child    Moved to [place] from [place] 11/09             Social Determinants of Health   Financial Resource Strain: Not on file  Food Insecurity: Not on file  Transportation Needs: Not on file  Physical Activity: Not on file  Stress: Not on file  Social Connections: Not on file  Intimate Partner Violence: Not on file    Current Outpatient Medications  Medication Instructions   amLODipine (NORVASC) 10 MG tablet TAKE 1 TABLET(10 MG) BY MOUTH DAILY    azelastine (ASTELIN) 0.1 % nasal spray 2 sprays, Each Nare, 2 times daily, Use in each nostril as directed   Calcium Carbonate-Vitamin D (CALTRATE 600+D PO) Oral, As directed,     chlorthalidone (HYGROTON) 25 MG tablet TAKE 1 TABLET(25 MG) BY MOUTH DAILY   ezetimibe (ZETIA) 10 MG tablet TAKE 1 TABLET(10 MG) BY MOUTH DAILY   fluticasone (FLONASE) 50 MCG/ACT nasal spray 2 sprays, Each Nare, Daily   losartan (COZAAR) 50 MG tablet TAKE 1 TABLET(50 MG) BY MOUTH DAILY   metoprolol tartrate (LOPRESSOR) 25 MG tablet TAKE 1/2 TABLET(12.5 MG) BY MOUTH DAILY   mometasone (NASONEX) 50 MCG/ACT nasal spray 2 sprays, Nasal, Daily   Multiple Vitamins-Minerals (MULTIVITAMINS) CHEW 1 tablet, Oral, Daily   potassium chloride SA (KLOR-CON) 20 MEQ tablet TAKE 1 TABLET BY MOUTH DAILY   pravastatin (PRAVACHOL) 80 MG tablet TAKE 1 TABLET(80 MG) BY MOUTH DAILY   spironolactone (ALDACTONE) 25 MG tablet TAKE 1/2 TABLET(12.5 MG) BY MOUTH TWICE DAILY       Objective:   Physical Exam BP 118/72   Pulse 70   Temp 97.9 F (36.6 C) (  Oral)   Resp 18   Ht '5\' 7"'$  (1.702 m)   Wt 151 lb (68.5 kg)   SpO2 99%   BMI 23.65 kg/m  General: Well developed, NAD, BMI noted Neck: No  thyromegaly  HEENT:  Normocephalic . Face symmetric, atraumatic Lungs:  CTA B Normal respiratory effort, no intercostal retractions, no accessory muscle use. Heart: RRR,  no murmur.  Abdomen:  Not distended, soft, non-tender. No rebound or rigidity.   Lower extremities: no pretibial edema bilaterally  Skin: Exposed areas without rash. Not pale. Not jaundice Neurologic:  alert & oriented X3.  Speech normal, gait appropriate for age and unassisted Strength symmetric and appropriate for age.  Psych: Cognition and judgment appear intact.  Cooperative with normal attention span and concentration.  Behavior appropriate. No anxious or depressed appearing.     Assessment    Assessment  Hyperglycemia: A1C 6.0  (05-2016) HTN LVH per echo  2015, 2021 (LVH mild-moderate): Hyperlipidemia Osteopenia : 02-2010: T score -2.4;  Tscore -1.1 2015: Tscore -2.0 Allergic rhinitis Shingles ~2010 Dry eye syndrome  Palpable abdominal aorta: US abdomen  12-2015: no AAA Gallbladder adenomatosis: Next [place] 05-2022  PLAN: Here for CPX Hyperglycemia: Checking A1c, has a healthy lifestyle HTN: BP today is very good, continue same meds Hyperlipidemia: On Pravachol, checking labs Osteopenia: T score last month was -1.0, recommend vitamin D and physical activity. Gallbladder adenomatosis: Next [place] October 2023. RTC 6 months

## 2022-02-21 ENCOUNTER — Encounter: Payer: Self-pay | Admitting: Internal Medicine

## 2022-02-21 NOTE — Assessment & Plan Note (Signed)
--  Td 2021 - PNM and shingles, has declined consistently -  Covid vax: Had a bivalent shot on 04-2021, okay to proceed with a booster -Recommend flu shot q fall  --No further paps, see previous entries. reports sees gyn -- [facility] 02-11-22 (per [facility]) --[facility]: colonoscopy per patient aprox 2004,Cscope again 5-10; Cscope 6/ 2020: Report reviewed, no polyps, no further colonoscopy --diet,exercise: Counseled   --labs: CMP, FLP, CBC, A1c --POA: Information provided.

## 2022-02-21 NOTE — Assessment & Plan Note (Signed)
Here for CPX Hyperglycemia: Checking A1c, has a healthy lifestyle HTN: BP today is very good, continue same meds Hyperlipidemia: On Pravachol, checking labs Osteopenia: T score last month was -1.0, recommend vitamin D and physical activity. Gallbladder adenomatosis: Next [place] October 2023. RTC 6 months

## 2022-03-07 ENCOUNTER — Other Ambulatory Visit: Payer: Self-pay | Admitting: Cardiovascular Disease

## 2022-03-09 NOTE — Telephone Encounter (Signed)
Rx(s) sent to pharmacy electronically.  

## 2022-04-02 ENCOUNTER — Telehealth: Payer: Self-pay | Admitting: Physician Assistant

## 2022-04-02 ENCOUNTER — Other Ambulatory Visit: Payer: Self-pay

## 2022-04-02 DIAGNOSIS — D135 Benign neoplasm of extrahepatic bile ducts: Secondary | ICD-10-CM

## 2022-04-02 DIAGNOSIS — R1310 Dysphagia, unspecified: Secondary | ICD-10-CM

## 2022-04-02 DIAGNOSIS — R1110 Vomiting, unspecified: Secondary | ICD-10-CM

## 2022-04-02 NOTE — Telephone Encounter (Signed)
PT is calling to reschedule swallow test. She is still having hard time swallowing and is throwing up milky mucous. Please advise

## 2022-04-02 NOTE — Telephone Encounter (Signed)
Katherine Koch was last seen a year ago with this same problem. She says she really put off the exam because she did not want the exposure to radiation of imaging.  She complains of a gagging and regurgitation of white slimy mucous when she eats. It is not every time she eats, but it is often. She does not regurgitate food. She is ready to have the DG esophagram if that is okay. Follow up u/s of the gallbladder is due in October.

## 2022-04-05 NOTE — Telephone Encounter (Signed)
DG esophagus with tablet 04/27/22. Abd U/S 05/25/22.

## 2022-04-27 ENCOUNTER — Ambulatory Visit (HOSPITAL_COMMUNITY)
Admission: RE | Admit: 2022-04-27 | Discharge: 2022-04-27 | Disposition: A | Discharge status: Home / Self Care | Payer: Medicare Other | Source: Ambulatory Visit | Attending: Physician Assistant | Admitting: Physician Assistant

## 2022-04-27 DIAGNOSIS — R1110 Vomiting, unspecified: Secondary | ICD-10-CM

## 2022-04-27 DIAGNOSIS — R1310 Dysphagia, unspecified: Secondary | ICD-10-CM | POA: Diagnosis present

## 2022-04-29 ENCOUNTER — Other Ambulatory Visit: Payer: Self-pay

## 2022-05-14 ENCOUNTER — Other Ambulatory Visit: Payer: Self-pay | Admitting: Cardiovascular Disease

## 2022-05-14 NOTE — Telephone Encounter (Signed)
Rx(s) sent to pharmacy electronically.  

## 2022-05-25 ENCOUNTER — Ambulatory Visit (HOSPITAL_COMMUNITY)
Admission: RE | Admit: 2022-05-25 | Discharge: 2022-05-25 | Disposition: A | Discharge status: Home / Self Care | Payer: Medicare Other | Source: Ambulatory Visit | Attending: Physician Assistant | Admitting: Physician Assistant

## 2022-05-25 DIAGNOSIS — D135 Benign neoplasm of extrahepatic bile ducts: Secondary | ICD-10-CM | POA: Insufficient documentation

## 2022-05-26 ENCOUNTER — Encounter: Payer: Self-pay | Admitting: Internal Medicine

## 2022-05-26 ENCOUNTER — Ambulatory Visit (INDEPENDENT_AMBULATORY_CARE_PROVIDER_SITE_OTHER): Payer: Medicare Other | Admitting: *Deleted

## 2022-05-26 DIAGNOSIS — Z23 Encounter for immunization: Secondary | ICD-10-CM | POA: Diagnosis not present

## 2022-05-26 NOTE — Progress Notes (Signed)
Patient here for high dose flu vaccine.  Vaccine given in left deltoid and patient tolerated well.  

## 2022-06-01 ENCOUNTER — Other Ambulatory Visit: Payer: Self-pay | Admitting: Internal Medicine

## 2022-06-11 ENCOUNTER — Other Ambulatory Visit: Payer: Self-pay | Admitting: Cardiovascular Disease

## 2022-06-14 ENCOUNTER — Telehealth: Payer: Self-pay | Admitting: Cardiovascular Disease

## 2022-06-14 MED ORDER — LOSARTAN POTASSIUM 50 MG PO TABS: 50.0000 mg | ORAL_TABLET | Freq: Every day | ORAL | 0 refills | Status: DC

## 2022-06-14 NOTE — Telephone Encounter (Signed)
*  STAT* If patient is at the pharmacy, call can be transferred to refill team.   1. Which medications need to be refilled? (please list name of each medication and dose if known)  losartan (COZAAR) 50 MG tablet  2. Which pharmacy/location (including street and city if local pharmacy) is medication to be sent to? [id number] - [place], [place] - [address] AT [address]  3. Do they need a 30 day or 90 day supply?   Patient is requesting to have current Rx increased to 90 days. Patient states a 90 day supply makes more sense with her insurance.

## 2022-06-14 NOTE — Telephone Encounter (Signed)
Rx request sent to pharmacy.  

## 2022-07-05 ENCOUNTER — Other Ambulatory Visit: Payer: Self-pay | Admitting: Cardiovascular Disease

## 2022-07-05 DIAGNOSIS — I10 Essential (primary) hypertension: Secondary | ICD-10-CM

## 2022-07-05 NOTE — Telephone Encounter (Signed)
Rx(s) sent to pharmacy electronically.  

## 2022-07-12 ENCOUNTER — Other Ambulatory Visit (HOSPITAL_BASED_OUTPATIENT_CLINIC_OR_DEPARTMENT_OTHER): Payer: Self-pay

## 2022-08-12 ENCOUNTER — Ambulatory Visit (HOSPITAL_BASED_OUTPATIENT_CLINIC_OR_DEPARTMENT_OTHER): Payer: Medicare Other | Admitting: Cardiovascular Disease

## 2022-08-12 NOTE — Progress Notes (Incomplete)
Cardiology Office Note  Date:  08/12/2022   ID:  Katherine Koch, DOB 1946/07/02, MRN [id number]  PCP:  Katherine Branch, MD  Cardiologist:   Katherine Koch   No chief complaint on file.    History of Present Illness: Katherine Koch is a 76 y.o. female with resistent hypertension, hyperlipidemia, and LVH who presents for follow up.  Katherine Koch was previously a patient of Dr. Aundra Koch.  She was last seen 06/2016.  She had an echo 03/2014 that revealed LVEF 60-65% with grade 2 diastolic dysfunction.  She had severe left ventricular hypertrophy which was thought to be due to poorly controlled hypertension.  Her PCP recommended that she restart a statinbut was hesitant to start atorvastatin because she thinks this caused her mother to develop lymphoma.  She also is resistant to rosuvastatin as she is thinks this caused her to have yeast infections.  She did consent to restarting pravastatin, though this has not been effective at getting her to goal in the past.  Her lipids were poorly controled but she wanted to work on diet and exercise. There was slight improvement on her labs 02/2021. Pravastatin was increased to 80 mg and Zetia was added.   At her last visit we discussed a calcium score but she declined.   Today,  She denies any palpitations, chest pain, shortness of breath, or peripheral edema. No lightheadedness, headaches, syncope, orthopnea, or PND.  (+)  Past Medical History:  Diagnosis Date   Allergic rhinitis    Allergy    Arthritis    Dry eye syndrome    Heart murmur    Hyperlipemia    Hypertension    LVH (left ventricular hypertrophy) 05/14/2021   Osteopenia    per pt had a DEXA few years ago   Shingles 3/10    Past Surgical History:  Procedure Laterality Date   ABDOMINAL HYSTERECTOMY     B oophorectomy- remotely   BREAST EXCISIONAL BIOPSY Right      Current Outpatient Medications  Medication Sig Dispense Refill   amLODipine (NORVASC) 10 MG tablet TAKE 1 TABLET(10 MG)  BY MOUTH DAILY 90 tablet 3   azelastine (ASTELIN) 0.1 % nasal spray Place 2 sprays into both nostrils 2 (two) times daily. Use in each nostril as directed 30 mL 6   Calcium Carbonate-Vitamin D (CALTRATE 600+D PO) Take by mouth as directed.     chlorthalidone (HYGROTON) 25 MG tablet TAKE 1 TABLET(25 MG) BY MOUTH DAILY 90 tablet 0   ezetimibe (ZETIA) 10 MG tablet Take 1 tablet (10 mg total) by mouth daily. 90 tablet 1   fluticasone (FLONASE) 50 MCG/ACT nasal spray Place 2 sprays into both nostrils daily. 16 g 6   losartan (COZAAR) 50 MG tablet Take 1 tablet (50 mg total) by mouth daily. Please keep your upcoming appointment for further refills. Has been over a year. 90 tablet 0   metoprolol tartrate (LOPRESSOR) 25 MG tablet TAKE 1/2 TABLET(12.5 MG) BY MOUTH DAILY 45 tablet 0   mometasone (NASONEX) 50 MCG/ACT nasal spray Place 2 sprays into the nose daily. 17 g 12   Multiple Vitamins-Minerals (MULTIVITAMINS) CHEW Chew 1 tablet by mouth daily.      potassium chloride SA (KLOR-CON M) 20 MEQ tablet TAKE 1 TABLET BY MOUTH DAILY 90 tablet 0   pravastatin (PRAVACHOL) 80 MG tablet TAKE 1 TABLET(80 MG) BY MOUTH DAILY 90 tablet 1   spironolactone (ALDACTONE) 25 MG tablet TAKE 1/2 TABLET(12.5 MG) BY MOUTH TWICE DAILY 90 tablet  3   No current facility-administered medications for this visit.    Allergies:   Patient has no known allergies.    Social History:  The patient  reports that she has quit smoking. Her smoking use included cigarettes. She has never used smokeless tobacco. She reports that she does not drink alcohol and does not use drugs.   Family History:  The patient's family history includes Coronary artery disease in her mother; Hypertension in her brother and mother; Non-Hodgkin's lymphoma in her mother.    ROS:   Please see the history of present illness.  All other systems are reviewed and negative.    PHYSICAL EXAM: VS:  There were no vitals taken for this visit. , BMI There is no  height or weight on file to calculate BMI. GENERAL:  Well appearing HEENT: Pupils equal round and reactive, fundi not visualized, oral mucosa unremarkable NECK:  No jugular venous distention, waveform within normal limits, carotid upstroke brisk and symmetric, no bruits, no thyromegaly LYMPHATICS:  No cervical adenopathy LUNGS:  Clear to auscultation bilaterally HEART:  RRR.  PMI not displaced or sustained,S1 and S2 within normal limits, no S3, no S4, no clicks, no rubs, ***II/VI systolic murmurs ABD:  Flat, positive bowel sounds normal in frequency in pitch, no bruits, no rebound, no guarding, no midline pulsatile mass, no hepatomegaly, no splenomegaly EXT:  2 plus pulses throughout, ***trace left LE edema, no cyanosis no clubbing SKIN:  ***Cyst on right wrist. No rashes no nodules NEURO:  Cranial nerves II through XII grossly intact, motor grossly intact throughout PSYCH:  Cognitively intact, oriented to person place and time   EKG:  EKG is personally reviewed. 08/12/2022:  Sinus ***. Rate *** bpm. 05/14/2021: Sinus rhythm. Rate 65 bpm. LVH with secondary repolarization abnormalities. 05/02/2020: Sinus rhythm.  Rate 66 bpm.  LVH with repolarization abnormality. 05/17/2018: Sinus rhythm.  Rate 72 bpm.  LVH with secondary repolarization of normalities. 06/29/17: sinus rhythm.  Rate 65 bpm. LVH with secondary repolarization abnormalities.   Echo 02/20/2019: 1. The left ventricle has hyperdynamic systolic function, with an  ejection fraction of >65%. The cavity size was normal. There is mildly  increased left ventricular wall thickness. Left ventricular diastolic  Doppler parameters are consistent with  impaired relaxation. No evidence of left ventricular regional wall motion  abnormalities.   2. The right ventricle has normal systolic function. The cavity was  normal. There is no increase in right ventricular wall thickness.   3. There is moderate mitral annular calcification present. No  evidence of  mitral valve stenosis. No significant mitral regurgitation.   4. The aortic valve is tricuspid. Mild calcification of the aortic valve.  No stenosis of the aortic valve.   5. The aortic root is normal in size and structure.   6. There is mild dilatation of the ascending aorta measuring 38 mm.   7. Normal IVC size. No complete TR doppler jet so unable to estimate PA  systolic pressure.   8. No valvular cause for murmur noted. With hyperdynamic LV function, may be flow murmur.   Echo 03/28/14: Study Conclusions  - Left ventricle: The cavity size was normal. Wall thickness was   increased in a pattern of severe LVH. Systolic function was   normal. The estimated ejection fraction was in the range of 60%   to 65%. Features are consistent with a pseudonormal left   ventricular filling pattern, with concomitant abnormal relaxation   and increased filling pressure (grade 2 diastolic  dysfunction). - Mitral valve: Calcified annulus. Mildly thickened leaflets .   There was mild regurgitation. - Left atrium: The atrium was mildly dilated.  Recent Labs: 02/19/2022: ALT 23; BUN 17; Creatinine, Ser 0.70; Hemoglobin 13.6; Platelets 253.0; Potassium 3.8; Sodium 138    Lipid Panel    Component Value Date/Time   CHOL 162 02/19/2022 1118   TRIG 87.0 02/19/2022 1118   HDL 52.30 02/19/2022 1118   CHOLHDL 3 02/19/2022 1118   VLDL 17.4 02/19/2022 1118   LDLCALC 93 02/19/2022 1118   LDLCALC 139 (H) 09/23/2020 0938   LDLDIRECT 155.8 02/15/2012 1354      Wt Readings from Last 3 Encounters:  02/19/22 151 lb (68.5 kg)  09/30/21 152 lb 6.4 oz (69.1 kg)  08/21/21 151 lb 4 oz (68.6 kg)      ASSESSMENT AND PLAN:  No problem-specific Assessment & Plan notes found for this encounter.   Disposition:    FU with PharmD in 1 month. FU with Tiffany C. Oval Linsey, MD, [facility] in ***1 year    Medication Adjustments/Labs and Tests Ordered: Current medicines are reviewed at length with the patient  today.  Concerns regarding medicines are outlined above.   No orders of the defined types were placed in this encounter.  No orders of the defined types were placed in this encounter.  I,Mathew Stumpf,acting as a Education administrator for Skeet Latch, MD.,have documented all relevant documentation on the behalf of Skeet Latch, MD,as directed by  Skeet Latch, MD while in the presence of Skeet Latch, MD.  I, [place] Oval Linsey, MD have reviewed all documentation for this visit.  The documentation of the exam, diagnosis, procedures, and orders on 08/12/2022 are all accurate and complete.  Signed, Tiffany C. Oval Linsey, MD, [facility]  08/12/2022 7:33 AM    [place] patient

## 2022-08-24 ENCOUNTER — Ambulatory Visit: Payer: Medicare Other | Admitting: Internal Medicine

## 2022-08-24 ENCOUNTER — Encounter: Payer: Self-pay | Admitting: Internal Medicine

## 2022-08-24 VITALS — BP 132/80 | HR 64 | Temp 97.8°F | Resp 16 | Ht 67.0 in | Wt 153.1 lb

## 2022-08-24 DIAGNOSIS — I10 Essential (primary) hypertension: Secondary | ICD-10-CM

## 2022-08-24 DIAGNOSIS — R739 Hyperglycemia, unspecified: Secondary | ICD-10-CM

## 2022-08-24 LAB — BASIC METABOLIC PANEL
BUN: 16 mg/dL (ref 6–23)
CO2: 30 mEq/L (ref 19–32)
Calcium: 9.8 mg/dL (ref 8.4–10.5)
Chloride: 102 mEq/L (ref 96–112)
Creatinine, Ser: 0.75 mg/dL (ref 0.40–1.20)
GFR: 77.29 mL/min (ref >60.00)
Glucose, Bld: 86 mg/dL (ref 70–99)
Potassium: 4.2 mEq/L (ref 3.5–5.1)
Sodium: 140 mEq/L (ref 135–145)

## 2022-08-24 LAB — HEMOGLOBIN A1C: Hgb A1c MFr Bld: 6.2 % (ref 4.6–6.5)

## 2022-08-24 NOTE — Assessment & Plan Note (Signed)
Hyperglycemia: Has been less active lately, check A1c.  Encouraged to increase physical activity. HTN: On amlodipine, chlorthalidone, losartan, metoprolol, potassium, Aldactone.  BP today satisfactory, reports good ambulatory BPs.  No change, check BMP. Gallbladder adenomatosis: Last [place] 05-2022, no follow-up necessary. 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 RTC 6 months

## 2022-08-24 NOTE — Progress Notes (Signed)
Subjective:    Patient ID: Katherine Koch, female    DOB: [birth year], 77 y.o.   MRN: [id number]  DOS:  08/24/2022 Type of visit - description: f/u  Since the last office visit she is feeling well. Good med compliance and normal ambulatory BPs. Denies chest pain or difficulty breathing No lower extremity edema  Review of Systems See above   Past Medical History:  Diagnosis Date   Allergic rhinitis    Allergy    Arthritis    Dry eye syndrome    Heart murmur    Hyperlipemia    Hypertension    LVH (left ventricular hypertrophy) 05/14/2021   Osteopenia    per pt had a DEXA few years ago   Shingles 3/10    Past Surgical History:  Procedure Laterality Date   ABDOMINAL HYSTERECTOMY     B oophorectomy- remotely   BREAST EXCISIONAL BIOPSY Right     Current Outpatient Medications  Medication Instructions   amLODipine (NORVASC) 10 MG tablet TAKE 1 TABLET(10 MG) BY MOUTH DAILY   azelastine (ASTELIN) 0.1 % nasal spray 2 sprays, Each Nare, 2 times daily, Use in each nostril as directed   Calcium Carbonate-Vitamin D (CALTRATE 600+D PO) Oral, As directed,     chlorthalidone (HYGROTON) 25 MG tablet TAKE 1 TABLET(25 MG) BY MOUTH DAILY   ezetimibe (ZETIA) 10 mg, Oral, Daily   fluticasone (FLONASE) 50 MCG/ACT nasal spray 2 sprays, Each Nare, Daily   losartan (COZAAR) 50 mg, Oral, Daily, Please keep your upcoming appointment for further refills. Has been over a year.   metoprolol tartrate (LOPRESSOR) 25 MG tablet TAKE 1/2 TABLET(12.5 MG) BY MOUTH DAILY   mometasone (NASONEX) 50 MCG/ACT nasal spray 2 sprays, Nasal, Daily   Multiple Vitamins-Minerals (MULTIVITAMINS) CHEW 1 tablet, Oral, Daily   potassium chloride SA (KLOR-CON M) 20 MEQ tablet TAKE 1 TABLET BY MOUTH DAILY   pravastatin (PRAVACHOL) 80 MG tablet TAKE 1 TABLET(80 MG) BY MOUTH DAILY   spironolactone (ALDACTONE) 25 MG tablet TAKE 1/2 TABLET(12.5 MG) BY MOUTH TWICE DAILY       Objective:   Physical Exam BP 132/80   Pulse 64    Temp 97.8 F (36.6 C) (Oral)   Resp 16   Ht '5\' 7"'$  (1.702 m)   Wt 153 lb 2 oz (69.5 kg)   SpO2 99%   BMI 23.98 kg/m  General:   Well developed, NAD, BMI noted. HEENT:  Normocephalic . Face symmetric, atraumatic Lungs:  CTA B Normal respiratory effort, no intercostal retractions, no accessory muscle use. Heart: RRR,  no murmur.  Lower extremities: no pretibial edema bilaterally  Skin: Not pale. Not jaundice Neurologic:  alert & oriented X3.  Speech normal, gait appropriate for age and unassisted Psych--  Cognition and judgment appear intact.  Cooperative with normal attention span and concentration.  Behavior appropriate. No anxious or depressed appearing.      Assessment     Assessment  Hyperglycemia: A1C 6.0  (05-2016) HTN LVH per echo 2015, 2021 (LVH mild-moderate): Hyperlipidemia Osteopenia : 02-2010: T score -2.4;  Tscore -1.1 2015: Tscore -2.0 Allergic rhinitis Shingles ~2010 Dry eye syndrome  Palpable abdominal aorta: US abdomen  12-2015: no AAA Gallbladder adenomatosis:  [place] 05-2022: No follow-up necessary Hepatic asteatosis: Noted during GB US  PLAN: Hyperglycemia: Has been less active lately, check A1c.  Encouraged to increase physical activity. HTN: On amlodipine, chlorthalidone, losartan, metoprolol, potassium, Aldactone.  BP today satisfactory, reports good ambulatory BPs.  No change, check BMP. Gallbladder  adenomatosis: Last [place] 05-2022, no follow-up necessary. 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 RTC 6 months

## 2022-08-24 NOTE — Patient Instructions (Addendum)
Check the  blood pressure regularly BP GOAL is between 110/65 and  135/85. If it is consistently higher or lower, let me know    GO TO THE LAB : Get the blood work     [place], [place] back for a physical exam  by 02-2023

## 2022-09-02 ENCOUNTER — Other Ambulatory Visit: Payer: Self-pay | Admitting: Cardiovascular Disease

## 2022-09-02 NOTE — Telephone Encounter (Signed)
Rx(s) sent to pharmacy electronically.  

## 2022-09-07 ENCOUNTER — Other Ambulatory Visit: Payer: Self-pay | Admitting: Cardiovascular Disease

## 2022-09-07 NOTE — Telephone Encounter (Signed)
Rx(s) sent to pharmacy electronically.  

## 2022-09-09 ENCOUNTER — Encounter (HOSPITAL_BASED_OUTPATIENT_CLINIC_OR_DEPARTMENT_OTHER): Payer: Self-pay | Admitting: Family

## 2022-09-09 ENCOUNTER — Ambulatory Visit (HOSPITAL_BASED_OUTPATIENT_CLINIC_OR_DEPARTMENT_OTHER): Payer: Medicare Other | Admitting: Family

## 2022-09-09 VITALS — BP 120/68 | HR 65 | Ht 67.0 in | Wt 155.3 lb

## 2022-09-09 DIAGNOSIS — I517 Cardiomegaly: Secondary | ICD-10-CM | POA: Diagnosis not present

## 2022-09-09 DIAGNOSIS — I10 Essential (primary) hypertension: Secondary | ICD-10-CM

## 2022-09-09 DIAGNOSIS — I77810 Thoracic aortic ectasia: Secondary | ICD-10-CM | POA: Diagnosis not present

## 2022-09-09 DIAGNOSIS — E782 Mixed hyperlipidemia: Secondary | ICD-10-CM | POA: Diagnosis not present

## 2022-09-09 MED ORDER — AMLODIPINE BESYLATE 10 MG PO TABS: 10.0000 mg | ORAL_TABLET | Freq: Every day | ORAL | 3 refills | Status: DC

## 2022-09-09 MED ORDER — CHLORTHALIDONE 25 MG PO TABS: 25.0000 mg | ORAL_TABLET | Freq: Every day | ORAL | 3 refills | Status: DC

## 2022-09-09 MED ORDER — SPIRONOLACTONE 25 MG PO TABS: ORAL_TABLET | ORAL | 3 refills | Status: DC

## 2022-09-09 NOTE — Addendum Note (Signed)
Addended by: Gerald Stabs on: 09/09/2022 03:33 PM   Modules accepted: Orders

## 2022-09-09 NOTE — Patient Instructions (Signed)
Medication Instructions:  Your Physician recommend you continue on your current medication as directed.    We have refilled your Chlorthalidone, Spironolactone, and amlodipine.  *If you need a refill on your cardiac medications before your next appointment, please call your pharmacy*  Testing/Procedures: Your physician has requested that you have an echocardiogram. Echocardiography is a painless test that uses sound waves to create images of your heart. It provides your doctor with information about the size and shape of your heart and how well your heart's chambers and valves are working. This procedure takes approximately one hour. There are no restrictions for this procedure. Please do NOT wear cologne, perfume, aftershave, or lotions (deodorant is allowed). Please arrive 15 minutes prior to your appointment time.  Follow-Up: At [facility], you and your health needs are our priority.  As part of our continuing mission to provide you with exceptional heart care, we have created designated Provider Care Teams.  These Care Teams include your primary Cardiologist (physician) and Advanced Practice Providers (APPs -  Physician Assistants and Nurse Practitioners) who all work together to provide you with the care you need, when you need it.  We recommend signing up for the patient portal called "MyChart".  Sign up information is provided on this After Visit Summary.  MyChart is used to connect with patients for Virtual Visits (Telemedicine).  Patients are able to view lab/test results, encounter notes, upcoming appointments, etc.  Non-urgent messages can be sent to your provider as well.   To learn more about what you can do with MyChart, go to [email or web address].    Your next appointment:   1 year(s)  Provider:   Skeet Latch, MD    Other Instructions Heart Healthy Diet Recommendations: A low-salt diet is recommended. Meats should be grilled, baked, or boiled. Avoid fried  foods. Focus on lean protein sources like fish or chicken with vegetables and fruits. The American Heart Association is a Microbiologist!  American Heart Association Diet and Lifeystyle Recommendations   Exercise recommendations: The American Heart Association recommends 150 minutes of moderate intensity exercise weekly. Try 30 minutes of moderate intensity exercise 4-5 times per week. This could include walking, jogging, or swimming.

## 2022-09-09 NOTE — Progress Notes (Signed)
Office Visit    Patient Name: Katherine Koch Date of Encounter: 09/09/2022  PCP:  Colon Branch, MD   [place]  Cardiologist:  Skeet Latch, MD  Advanced Practice Provider:  No care team member to display Electrophysiologist:  None      Chief Complaint    Katherine Koch is a 77 y.o. female presents today for follow-up of hypertension  Past Medical History    Past Medical History:  Diagnosis Date   Allergic rhinitis    Allergy    Arthritis    Dry eye syndrome    Heart murmur    Hyperlipemia    Hypertension    LVH (left ventricular hypertrophy) 05/14/2021   Osteopenia    per pt had a DEXA few years ago   Shingles 3/10   Past Surgical History:  Procedure Laterality Date   ABDOMINAL HYSTERECTOMY     B oophorectomy- remotely   BREAST EXCISIONAL BIOPSY Right     Allergies  No Known Allergies  History of Present Illness    Katherine Koch is a 77 y.o. female with a hx of resistant hypertension, hyperlipidemia, LVH last seen 05/06/2021 by Dr. Oval Linsey.  Previous patient of [name].  Echo August 2015 LVEF 60 to 81%, grade 2 diastolic dysfunction.  She had severe LVH was thought to be due to poorly controlled hypertension.  Updated echo 10/2018 LVEF greater than 82%, diastolic parameters consistent with impaired relaxation, moderate mitral annular calcification but no significant stenosis, mild dilation ascending aorta 38 mm.  History of statin intolerance (declined atorvastatin as she thinks this caused her mother's lymphoma, concerned rosuvastatin caused yeast infection).  She was agreeable to pravastatin and this has been uptitrated to 80 mg daily and Zetia added.  She presents today for follow-up independently. She enjoys spending time with her 65 year old grandson who enjoys reading and swimming. Monitors BP at home with readings 117s/60s. She follows a heart healthy. Reports no shortness of breath nor dyspnea on exertion. Reports no chest  pain, pressure, or tightness. No edema, orthopnea, PND. Reports no palpitations.    EKGs/Labs/Other Studies Reviewed:   The following studies were reviewed today:  Echo 02/20/2019: 1. The left ventricle has hyperdynamic systolic function, with an  ejection fraction of >65%. The cavity size was normal. There is mildly  increased left ventricular wall thickness. Left ventricular diastolic  Doppler parameters are consistent with  impaired relaxation. No evidence of left ventricular regional wall motion  abnormalities.   2. The right ventricle has normal systolic function. The cavity was  normal. There is no increase in right ventricular wall thickness.   3. There is moderate mitral annular calcification present. No evidence of  mitral valve stenosis. No significant mitral regurgitation.   4. The aortic valve is tricuspid. Mild calcification of the aortic valve.  No stenosis of the aortic valve.   5. The aortic root is normal in size and structure.   6. There is mild dilatation of the ascending aorta measuring 38 mm.   7. Normal IVC size. No complete TR doppler jet so unable to estimate PA  systolic pressure.   8. No valvular cause for murmur noted. With hyperdynamic LV function, may be flow murmur.    Echo 03/28/14: Study Conclusions  - Left ventricle: The cavity size was normal. Wall thickness was   increased in a pattern of severe LVH. Systolic function was   normal. The estimated ejection fraction was in the range of  60%   to 65%. Features are consistent with a pseudonormal left   ventricular filling pattern, with concomitant abnormal relaxation   and increased filling pressure (grade 2 diastolic dysfunction). - Mitral valve: Calcified annulus. Mildly thickened leaflets .   There was mild regurgitation. - Left atrium: The atrium was mildly dilated.    EKG:  EKG is  ordered today.  The ekg ordered today demonstrates NSR 65 bpm with severe LVH. No acute ST/T wave changes.   Recent  Labs: 02/19/2022: ALT 23; Hemoglobin 13.6; Platelets 253.0 08/24/2022: BUN 16; Creatinine, Ser 0.75; Potassium 4.2; Sodium 140  Recent Lipid Panel    Component Value Date/Time   CHOL 162 02/19/2022 1118   TRIG 87.0 02/19/2022 1118   HDL 52.30 02/19/2022 1118   CHOLHDL 3 02/19/2022 1118   VLDL 17.4 02/19/2022 1118   LDLCALC 93 02/19/2022 1118   LDLCALC 139 (H) 09/23/2020 0938   LDLDIRECT 155.8 02/15/2012 1354     Home Medications   Current Meds  Medication Sig   azelastine (ASTELIN) 0.1 % nasal spray Place 2 sprays into both nostrils 2 (two) times daily. Use in each nostril as directed (Patient taking differently: Place 2 sprays into both nostrils as needed for allergies or rhinitis. Use in each nostril as directed)   Calcium Carbonate-Vitamin D (CALTRATE 600+D PO) Take by mouth as directed.   ezetimibe (ZETIA) 10 MG tablet Take 1 tablet (10 mg total) by mouth daily.   fluticasone (FLONASE) 50 MCG/ACT nasal spray Place 2 sprays into both nostrils daily.   losartan (COZAAR) 50 MG tablet Take 1 tablet (50 mg total) by mouth daily. Please keep your upcoming appointment for further refills. Has been over a year.   metoprolol tartrate (LOPRESSOR) 25 MG tablet TAKE 1/2 TABLET(12.5 MG) BY MOUTH DAILY   mometasone (NASONEX) 50 MCG/ACT nasal spray Place 2 sprays into the nose daily.   Multiple Vitamins-Minerals (MULTIVITAMINS) CHEW Chew 1 tablet by mouth daily.    potassium chloride SA (KLOR-CON M) 20 MEQ tablet TAKE 1 TABLET BY MOUTH DAILY   pravastatin (PRAVACHOL) 80 MG tablet TAKE 1 TABLET(80 MG) BY MOUTH DAILY   [DISCONTINUED] amLODipine (NORVASC) 10 MG tablet TAKE 1 TABLET(10 MG) BY MOUTH DAILY   [DISCONTINUED] chlorthalidone (HYGROTON) 25 MG tablet TAKE 1 TABLET(25 MG) BY MOUTH DAILY   [DISCONTINUED] spironolactone (ALDACTONE) 25 MG tablet TAKE 1/2 TABLET(12.5 MG) BY MOUTH TWICE DAILY     Review of Systems    All other systems reviewed and are otherwise negative except as noted  above.  Physical Exam    VS:  BP 120/68 (BP Location: Left Arm, Patient Position: Sitting, Cuff Size: Normal)   Pulse 65   Ht '5\' 7"'$  (1.702 m)   Wt 155 lb 4.8 oz (70.4 kg)   BMI 24.32 kg/m  , BMI Body mass index is 24.32 kg/m.  Wt Readings from Last 3 Encounters:  09/09/22 155 lb 4.8 oz (70.4 kg)  08/24/22 153 lb 2 oz (69.5 kg)  02/19/22 151 lb (68.5 kg)    GEN: Well nourished, well developed, in no acute distress. HEENT: normal. Neck: Supple, no JVD, carotid bruits, or masses. Cardiac: RRR, no murmurs, rubs, or gallops. No clubbing, cyanosis, edema.  Radials/PT 2+ and equal bilaterally.  Respiratory:  Respirations regular and unlabored, clear to auscultation bilaterally. GI: Soft, nontender, nondistended. MS: No deformity or atrophy. Skin: Warm and dry, no rash. Neuro:  Strength and sensation are intact. Psych: Normal affect.  Assessment & Plan  HTN- BP well controlled. Continue current antihypertensive regimen.  Refills provided.  Hyperlipidemia-02/2022 total cholesterol 162, HDL 52, triglycerides 87, LDL 93.  Continue pravastatin, Zetia.  Mild dilation of ascending aorta - echo 02/20/19 ascending aorta 38 mm.  Continue optimal blood pressure control.  Continue metoprolol 12.5 mg daily.  Update echocardiogram as last study greater than 3 years ago.  LVH- Mild LVH by echo. No heart failure symptoms. Continue to monitor.  Update echo, as above       Disposition: Follow up in 1 year(s) with Skeet Latch, MD or APP.  Signed, Loel Dubonnet, NP 09/09/2022, 2:13 PM [place]

## 2022-09-24 ENCOUNTER — Other Ambulatory Visit: Payer: Self-pay | Admitting: Cardiovascular Disease

## 2022-09-24 NOTE — Telephone Encounter (Signed)
Rx request sent to pharmacy.  

## 2022-09-29 ENCOUNTER — Other Ambulatory Visit (HOSPITAL_BASED_OUTPATIENT_CLINIC_OR_DEPARTMENT_OTHER): Payer: Self-pay

## 2022-09-29 ENCOUNTER — Other Ambulatory Visit (HOSPITAL_BASED_OUTPATIENT_CLINIC_OR_DEPARTMENT_OTHER): Payer: Medicare Other

## 2022-09-29 MED ORDER — COMIRNATY 30 MCG/0.3ML IM SUSY
PREFILLED_SYRINGE | INTRAMUSCULAR | 0 refills | Status: DC
  Filled 2022-09-29: qty 0.3, 1d supply, fill #0

## 2022-10-05 ENCOUNTER — Encounter: Payer: Self-pay | Admitting: Internal Medicine

## 2022-10-05 ENCOUNTER — Ambulatory Visit: Payer: Medicare Other | Admitting: Internal Medicine

## 2022-10-05 VITALS — BP 138/80 | HR 72 | Temp 98.1°F | Resp 12 | Ht 67.0 in | Wt 151.6 lb

## 2022-10-05 DIAGNOSIS — L858 Other specified epidermal thickening: Secondary | ICD-10-CM

## 2022-10-05 DIAGNOSIS — Z09 Encounter for follow-up examination after completed treatment for conditions other than malignant neoplasm: Secondary | ICD-10-CM

## 2022-10-05 NOTE — Progress Notes (Unsigned)
Subjective:    Patient ID: Katherine Koch, female    DOB: [birth year], 77 y.o.   MRN: [id number]  DOS:  10/05/2022 Type of visit - description: Acute  Has a rash, gradually getting worse for the last year. Mostly on the extremities, some of the lower abdomen and lower back. Denies pruritus.  Otherwise feels well with no fever or chills.  No nausea vomiting.  No diarrhea or blood in the stools.  Review of Systems See above   Past Medical History:  Diagnosis Date   Allergic rhinitis    Allergy    Arthritis    Dry eye syndrome    Heart murmur    Hyperlipemia    Hypertension    LVH (left ventricular hypertrophy) 05/14/2021   Osteopenia    per pt had a DEXA few years ago   Shingles 3/10    Past Surgical History:  Procedure Laterality Date   ABDOMINAL HYSTERECTOMY     B oophorectomy- remotely   BREAST EXCISIONAL BIOPSY Right     Current Outpatient Medications  Medication Instructions   amLODipine (NORVASC) 10 mg, Oral, Daily   azelastine (ASTELIN) 0.1 % nasal spray 2 sprays, Each Nare, 2 times daily, Use in each nostril as directed   Calcium Carbonate-Vitamin D (CALTRATE 600+D PO) Oral, As directed,     chlorthalidone (HYGROTON) 25 mg, Oral, Daily   ezetimibe (ZETIA) 10 mg, Oral, Daily   fluticasone (FLONASE) 50 MCG/ACT nasal spray 2 sprays, Each Nare, Daily   losartan (COZAAR) 50 mg, Oral, Daily   metoprolol tartrate (LOPRESSOR) 25 MG tablet TAKE 1/2 TABLET(12.5 MG) BY MOUTH DAILY   mometasone (NASONEX) 50 MCG/ACT nasal spray 2 sprays, Nasal, Daily   Multiple Vitamins-Minerals (MULTIVITAMINS) CHEW 1 tablet, Oral, Daily   potassium chloride SA (KLOR-CON M) 20 MEQ tablet TAKE 1 TABLET BY MOUTH DAILY   pravastatin (PRAVACHOL) 80 MG tablet TAKE 1 TABLET(80 MG) BY MOUTH DAILY   spironolactone (ALDACTONE) 25 MG tablet TAKE 1/2 TABLET(12.5 MG) BY MOUTH TWICE DAILY       Objective:   Physical Exam BP 138/80 (BP Location: Right Arm, Cuff Size: Normal)   Pulse 72   Temp 98.1  F (36.7 C) (Oral)   Resp 12   Ht 5' 7"$  (1.702 m)   Wt 151 lb 9.6 oz (68.8 kg)   SpO2 97%   BMI 23.74 kg/m  General:   Well developed, NAD, BMI noted. HEENT:  Normocephalic . Face symmetric, atraumatic  Skin: Hair follicles are palpable at the arms, legs, lower back.  See picture. Neurologic:  alert & oriented X3.  Speech normal, gait appropriate for age and unassisted Psych--  Cognition and judgment appear intact.  Cooperative with normal attention span and concentration.  Behavior appropriate. No anxious or depressed appearing.      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 It would wax and wane, there is no particular treatment that we will work for sure. I recommend observation. Encouraged to learn more about the issue, see  AVS. We could send her to dermatology if she remains concerned.

## 2022-10-05 NOTE — Patient Instructions (Signed)
Go to the [facility] website and look into "keratosis pilaris".  We can refer you to a dermatologist if you are concerned

## 2022-10-06 NOTE — Assessment & Plan Note (Signed)
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 It would wax and wane, there is no particular treatment that we will work for sure. I recommend observation. Encouraged to learn more about the issue, see AVS. We could send her to dermatology if she remains concerned.

## 2022-10-15 ENCOUNTER — Ambulatory Visit (HOSPITAL_COMMUNITY): Payer: Medicare Other | Attending: Cardiology

## 2022-10-15 ENCOUNTER — Telehealth (HOSPITAL_BASED_OUTPATIENT_CLINIC_OR_DEPARTMENT_OTHER): Payer: Self-pay

## 2022-10-15 DIAGNOSIS — R0602 Shortness of breath: Secondary | ICD-10-CM

## 2022-10-15 DIAGNOSIS — E782 Mixed hyperlipidemia: Secondary | ICD-10-CM | POA: Insufficient documentation

## 2022-10-15 DIAGNOSIS — I517 Cardiomegaly: Secondary | ICD-10-CM | POA: Insufficient documentation

## 2022-10-15 DIAGNOSIS — I10 Essential (primary) hypertension: Secondary | ICD-10-CM | POA: Diagnosis present

## 2022-10-15 LAB — ECHOCARDIOGRAM COMPLETE
Area-P 1/2: 4.19 cm2
S' Lateral: 2.5 cm

## 2022-10-15 NOTE — Telephone Encounter (Addendum)
Left message for patient to call back    ----- Message from Loel Dubonnet, NP sent at 10/15/2022  4:52 PM EST ----- Normal heart pumping function. Heart muscle moderately stiff and thick. Similar to previous. Continue optimal blood pressure control. Aortic measurements normalized - good result!

## 2022-10-18 NOTE — Telephone Encounter (Signed)
Pt. Returned call to review echo results. Results called to patient who verbalizes understanding!

## 2022-10-22 ENCOUNTER — Other Ambulatory Visit: Payer: Self-pay | Admitting: Cardiovascular Disease

## 2022-10-22 DIAGNOSIS — I10 Essential (primary) hypertension: Secondary | ICD-10-CM

## 2022-10-22 NOTE — Telephone Encounter (Signed)
Rx(s) sent to pharmacy electronically.  

## 2022-11-22 ENCOUNTER — Other Ambulatory Visit: Payer: Self-pay | Admitting: Internal Medicine

## 2023-01-26 ENCOUNTER — Telehealth: Payer: Self-pay

## 2023-01-26 DIAGNOSIS — L858 Other specified epidermal thickening: Secondary | ICD-10-CM

## 2023-01-26 NOTE — Telephone Encounter (Signed)
Pt called stating she would like a referral placed for dermatology.

## 2023-01-26 NOTE — Telephone Encounter (Signed)
Referral placed.

## 2023-01-26 NOTE — Addendum Note (Signed)
Addended byConrad Mulberry D on: 01/26/2023 11:47 AM   Modules accepted: Orders

## 2023-02-27 ENCOUNTER — Other Ambulatory Visit: Payer: Self-pay | Admitting: Cardiovascular Disease

## 2023-02-28 NOTE — Telephone Encounter (Signed)
Rx(s) sent to pharmacy electronically.  

## 2023-03-01 ENCOUNTER — Encounter: Payer: Self-pay | Admitting: Internal Medicine

## 2023-03-01 ENCOUNTER — Ambulatory Visit: Payer: Medicare Other | Admitting: Internal Medicine

## 2023-03-01 VITALS — BP 126/76 | HR 62 | Temp 98.2°F | Resp 16 | Ht 67.0 in | Wt 154.2 lb

## 2023-03-01 DIAGNOSIS — I10 Essential (primary) hypertension: Secondary | ICD-10-CM

## 2023-03-01 DIAGNOSIS — R739 Hyperglycemia, unspecified: Secondary | ICD-10-CM | POA: Diagnosis not present

## 2023-03-01 DIAGNOSIS — E785 Hyperlipidemia, unspecified: Secondary | ICD-10-CM

## 2023-03-01 DIAGNOSIS — Z0000 Encounter for general adult medical examination without abnormal findings: Secondary | ICD-10-CM

## 2023-03-01 LAB — BASIC METABOLIC PANEL
BUN: 16 mg/dL (ref 6–23)
CO2: 28 mEq/L (ref 19–32)
Calcium: 10.3 mg/dL (ref 8.4–10.5)
Chloride: 102 mEq/L (ref 96–112)
Creatinine, Ser: 0.75 mg/dL (ref 0.40–1.20)
GFR: 77.01 mL/min (ref >60.00)
Glucose, Bld: 92 mg/dL (ref 70–99)
Potassium: 4.3 mEq/L (ref 3.5–5.1)
Sodium: 138 mEq/L (ref 135–145)

## 2023-03-01 LAB — LIPID PANEL
Cholesterol: 193 mg/dL (ref 0–200)
HDL: 53 mg/dL (ref >39.00)
LDL Cholesterol: 124 mg/dL — ABNORMAL HIGH (ref 0–99)
NonHDL: 139.8
Total CHOL/HDL Ratio: 4
Triglycerides: 81 mg/dL (ref 0.0–149.0)
VLDL: 16.2 mg/dL (ref 0.0–40.0)

## 2023-03-01 LAB — CBC WITH DIFFERENTIAL/PLATELET
Basophils Absolute: 0 10*3/uL (ref 0.0–0.1)
Basophils Relative: 0.8 % (ref 0.0–3.0)
Eosinophils Absolute: 0.2 10*3/uL (ref 0.0–0.7)
Eosinophils Relative: 4.7 % (ref 0.0–5.0)
HCT: 41.1 % (ref 36.0–46.0)
Hemoglobin: 13.6 g/dL (ref 12.0–15.0)
Lymphocytes Relative: 25.8 % (ref 12.0–46.0)
Lymphs Abs: 1.2 10*3/uL (ref 0.7–4.0)
MCHC: 33.1 g/dL (ref 30.0–36.0)
MCV: 87.4 fl (ref 78.0–100.0)
Monocytes Absolute: 0.4 10*3/uL (ref 0.1–1.0)
Monocytes Relative: 8.2 % (ref 3.0–12.0)
Neutro Abs: 2.8 10*3/uL (ref 1.4–7.7)
Neutrophils Relative %: 60.5 % (ref 43.0–77.0)
Platelets: 250 10*3/uL (ref 150.0–400.0)
RBC: 4.7 Mil/uL (ref 3.87–5.11)
RDW: 13.2 % (ref 11.5–15.5)
WBC: 4.6 10*3/uL (ref 4.0–10.5)

## 2023-03-01 LAB — AST: AST: 23 U/L (ref 0–37)

## 2023-03-01 LAB — HEMOGLOBIN A1C: Hgb A1c MFr Bld: 6.1 % (ref 4.6–6.5)

## 2023-03-01 LAB — ALT: ALT: 21 U/L (ref 0–35)

## 2023-03-01 NOTE — Patient Instructions (Addendum)
Vaccines I recommend: RSV Pneumonia shot (PNM 20) COVID booster Shingrix Flu shot every fall  Check the  blood pressure regularly BP GOAL is between 110/65 and  135/85. If it is consistently higher or lower, let me know     GO TO THE LAB : Get the blood work     GO TO THE FRONT DESK, PLEASE SCHEDULE YOUR APPOINTMENTS Come back for a physical exam in 1 year    "Health Care Power of attorney" ,  "Living will" (Advance care planning documents)  If you already have a living will or healthcare power of attorney, is recommended you bring the copy to be scanned in your chart.   The document will be available to all the doctors you see in the system.  Advance care planning is a process that supports adults in  understanding and sharing their preferences regarding future medical care.  The patient's preferences are recorded in documents called Advance Directives and the can be modified at any time while the patient is in full mental capacity.   If you don't have one, please consider create one.      More information at: [email or web address]

## 2023-03-01 NOTE — Progress Notes (Unsigned)
Subjective:    Patient ID: Katherine Koch, female    DOB: 07-22-1946, 77 y.o.   MRN: [id number]  DOS:  03/01/2023 Type of visit - description: CPX  Here for CPX Actually doing well. She specifically denies nausea vomiting. No chest pain no difficulty breathing No GI or GU symptoms.   Review of Systems See above   Past Medical History:  Diagnosis Date   Allergic rhinitis    Allergy    Arthritis    Dry eye syndrome    Heart murmur    Hyperlipemia    Hypertension    LVH (left ventricular hypertrophy) 05/14/2021   Osteopenia    per pt had a DEXA few years ago   Shingles 3/10    Past Surgical History:  Procedure Laterality Date   ABDOMINAL HYSTERECTOMY     B oophorectomy- remotely   BREAST EXCISIONAL BIOPSY Right     Current Outpatient Medications  Medication Instructions   amLODipine (NORVASC) 10 mg, Oral, Daily   azelastine (ASTELIN) 0.1 % nasal spray 2 sprays, Each Nare, 2 times daily, Use in each nostril as directed   Calcium Carbonate-Vitamin D (CALTRATE 600+D PO) Oral, As directed,     chlorthalidone (HYGROTON) 25 mg, Oral, Daily   ezetimibe (ZETIA) 10 mg, Oral, Daily   fluticasone (FLONASE) 50 MCG/ACT nasal spray 2 sprays, Each Nare, Daily   losartan (COZAAR) 50 mg, Oral, Daily   metoprolol tartrate (LOPRESSOR) 25 MG tablet TAKE 1/2 TABLET(12.5 MG) BY MOUTH DAILY   mometasone (NASONEX) 50 MCG/ACT nasal spray 2 sprays, Nasal, Daily   Multiple Vitamins-Minerals (MULTIVITAMINS) CHEW 1 tablet, Oral, Daily   potassium chloride SA (KLOR-CON M) 20 MEQ tablet TAKE 1 TABLET BY MOUTH DAILY   pravastatin (PRAVACHOL) 80 mg, Oral, Daily   spironolactone (ALDACTONE) 25 MG tablet TAKE 1/2 TABLET(12.5 MG) BY MOUTH TWICE DAILY       Objective:   Physical Exam BP 126/76   Pulse 62   Temp 98.2 F (36.8 C) (Oral)   Resp 16   Ht 5\' 7"  (1.702 m)   Wt 154 lb 4 oz (70 kg)   SpO2 97%   BMI 24.16 kg/m  General: Well developed, NAD, BMI noted Neck: No  thyromegaly  HEENT:   Normocephalic . Face symmetric, atraumatic Lungs:  CTA B Normal respiratory effort, no intercostal retractions, no accessory muscle use. Heart: RRR,  no murmur.  Abdomen:  Not distended, soft, non-tender. No rebound or rigidity.  Palpable nontender aorta noted.   Lower extremities: no pretibial edema bilaterally  Skin: Exposed areas without rash. Not pale. Not jaundice Neurologic:  alert & oriented X3.  Speech normal, gait appropriate for age and unassisted Strength symmetric and appropriate for age.  Psych: Cognition and judgment appear intact.  Cooperative with normal attention span and concentration.  Behavior appropriate. No anxious or depressed appearing.     Assessment   Assessment  Hyperglycemia: A1C 6.0  (05-2016) HTN LVH per echo 2015, 2021 (LVH mild-moderate): Hyperlipidemia Osteopenia : 02-2010: T score -2.4;  Tscore -1.1 2015: Tscore -2.0; 01-2022 T-score -1.0 Allergic rhinitis Shingles ~2010 Dry eye syndrome  Palpable abdominal aorta: US abdomen  12-2015: no AAA Gallbladder adenomatosis:  [place] 05-2022: No follow-up necessary Hepatic asteatosis: Noted during GB US  PLAN: Here for CPX   -Td 2021   -Vaccines I recommend: RSV, PNM 20, COVID booster, Shingrix, flu shot every fall --No further paps, see previous entries.  -- MMG 02-11-22 (per [facility]) --CCS: colonoscopy per patient aprox 2004,Cscope  again 5-10; Cscope 6/ 2020: Report reviewed, no polyps, no further colonoscopy -- Recommend a healthy lifestyle --labs:  BMP FLP AST ALT CBC A1c -Healthcare POA: Information provided

## 2023-03-02 ENCOUNTER — Encounter: Payer: Self-pay | Admitting: Internal Medicine

## 2023-03-02 NOTE — Assessment & Plan Note (Signed)
Here for CPX Hyperglycemia, hypertension, hyperlipidemia, osteopenia: Seems a stable, continue same medications, checking labs RTC 1 year

## 2023-03-02 NOTE — Assessment & Plan Note (Signed)
-  Td 2021  -Vaccines I recommend: RSV, PNM 20, COVID booster, Shingrix, flu shot every fall --No further paps, see previous entries.  -- MMG 02-11-22 (per [facility]) --CCS: colonoscopy per patient aprox 2004,Cscope again 5-10; Cscope 6/ 2020: Report reviewed, no polyps, no further colonoscopy -- Recommend a healthy lifestyle --labs:  BMP FLP AST ALT CBC A1c -Healthcare POA: Information provided

## 2023-03-07 ENCOUNTER — Other Ambulatory Visit: Payer: Self-pay | Admitting: Internal Medicine

## 2023-04-05 ENCOUNTER — Other Ambulatory Visit: Payer: Self-pay | Admitting: Cardiovascular Disease

## 2023-04-05 NOTE — Telephone Encounter (Signed)
Rx request sent to pharmacy.  

## 2023-05-19 ENCOUNTER — Telehealth: Payer: Self-pay | Admitting: Internal Medicine

## 2023-05-19 MED ORDER — FLUTICASONE PROPIONATE 50 MCG/ACT NA SUSP: 2.0000 | Freq: Every day | NASAL | 12 refills | Status: DC

## 2023-05-19 NOTE — Addendum Note (Signed)
Addended byConrad Bergen[place] D on: 05/19/2023 01:28 PM   Modules accepted: Orders

## 2023-05-19 NOTE — Telephone Encounter (Signed)
Rx sent 

## 2023-05-19 NOTE — Telephone Encounter (Signed)
Prescription Request  05/19/2023  Is this a "Controlled Substance" medicine? No  LOV: 03/01/2023  What is the name of the medication or equipment?   fluticasone (FLONASE) 50 MCG/ACT nasal spray  Have you contacted your pharmacy to request a refill? No   Which pharmacy would you like this sent to?  [id number] [place],  - [address] AT [facility] OF [address] & [address] [place] [address] Phone: [phone] Fax: [phone]    Patient notified that their request is being sent to the clinical staff for review and that they should receive a response within 2 business days.   Please advise at [facility] [phone]

## 2023-05-20 ENCOUNTER — Encounter (HOSPITAL_BASED_OUTPATIENT_CLINIC_OR_DEPARTMENT_OTHER): Payer: Self-pay | Admitting: Cardiovascular Disease

## 2023-05-20 ENCOUNTER — Ambulatory Visit (HOSPITAL_BASED_OUTPATIENT_CLINIC_OR_DEPARTMENT_OTHER): Payer: Medicare Other | Admitting: Cardiovascular Disease

## 2023-05-20 VITALS — BP 124/62 | HR 67 | Ht 67.0 in | Wt 156.6 lb

## 2023-05-20 DIAGNOSIS — I10 Essential (primary) hypertension: Secondary | ICD-10-CM

## 2023-05-20 DIAGNOSIS — G4733 Obstructive sleep apnea (adult) (pediatric): Secondary | ICD-10-CM | POA: Diagnosis not present

## 2023-05-20 DIAGNOSIS — E785 Hyperlipidemia, unspecified: Secondary | ICD-10-CM | POA: Diagnosis not present

## 2023-05-20 NOTE — Progress Notes (Signed)
Cardiology Office Note:  .    Date:  06/03/2023  ID:  Katherine Koch, DOB [birth year], MRN [id number] PCP: Wanda Plump, MD  [facility] Providers Cardiologist:  Chilton Si, MD     History of Present Illness: .    Katherine Koch is a 77 y.o. female with resistent hypertension, hyperlipidemia, and LVH who presents for follow up.  Katherine Koch was previously a patient of Dr. Shirlee Latch.  She was last seen 06/2016.  She had an echo 03/2014 that revealed LVEF 60-65% with grade 2 diastolic dysfunction.  She had severe left ventricular hypertrophy which was thought to be due to poorly controlled hypertension.  Her PCP recommended that she restart a statinbut was hesitant to start atorvastatin because she thinks this caused her mother to develop lymphoma.  She also is resistant to rosuvastatin as she is thinks this caused her to have yeast infections.  She did consent to restarting pravastatin, though this has not been effective at getting her to goal in the past.   In 04/2020, her lipids were poorly controled but she wanted to work on diet and exercise. There was slight improvement on her labs 02/2021. Pravastatin was increased to 80 mg and Zetia was added.  At her appointment 04/2021 she was feeling well.  Blood pressure was elevated in the office but controlled at home.  Recommended getting a calcium score but she declined.  She followed-up with Gillian Shields, NP 08/2022 and noted that blood pressures remain better controlled at home.  Today, she states she is feeling good with no new cardiovascular complaints. In the office her blood pressure is 133/75 initially, and 124/62 on manual recheck. She reports stable blood pressures at home. Due to her knee pain she has not been very active, but continues to go out on daily errands at least 5 days a week which consists of some walking. We reviewed her lipid panel 02/2023 showing LDL 124 (prior 93). She admits to occasionally forgetting to take her Zetia. She  denies any palpitations, chest pain, shortness of breath, lightheadedness, headaches, syncope, orthopnea, or PND.  ROS:  Please see the history of present illness. All other systems are reviewed and negative.  (+) Knee pain (+) Edema localized to her knees  Studies Reviewed: .        Echo  10-29-2022: 1. Left ventricular ejection fraction, by estimation, is 65 to 70%. The  left ventricle has normal function. The left ventricle has no regional  wall motion abnormalities. There is moderate left ventricular hypertrophy.  Left ventricular diastolic  parameters are consistent with Grade II diastolic dysfunction  (pseudonormalization).   2. Right ventricular systolic function is normal. The right ventricular  size is normal. There is normal pulmonary artery systolic pressure. The  estimated right ventricular systolic pressure is 22.9 mmHg.   3. Left atrial size was mildly dilated.   4. The mitral valve is normal in structure. Trivial mitral valve  regurgitation. No evidence of mitral stenosis. Moderate mitral annular  calcification.   5. The aortic valve is tricuspid. There is mild calcification of the  aortic valve. Aortic valve regurgitation is not visualized. Aortic valve  sclerosis is present, with no evidence of aortic valve stenosis.   6. The inferior vena cava is normal in size with greater than 50%  respiratory variability, suggesting right atrial pressure of 3 mmHg.   Comparison(s): No significant change from prior study. Prior images  reviewed side by side.   Risk Assessment/Calculations:  Physical Exam:    VS:  BP 124/62 (BP Location: Right Arm, Patient Position: Sitting, Cuff Size: Normal)   Pulse 67   Ht 5\' 7"  (1.702 m)   Wt 156 lb 9.6 oz (71 kg)   SpO2 99%   BMI 24.53 kg/m  , BMI Body mass index is 24.53 kg/m. GENERAL:  Well appearing HEENT: Pupils equal round and reactive, fundi not visualized, oral mucosa unremarkable NECK:  No jugular venous distention,  waveform within normal limits, carotid upstroke brisk and symmetric, no bruits, no thyromegaly LUNGS:  Clear to auscultation bilaterally HEART:  RRR.  PMI not displaced or sustained,S1 and S2 within normal limits, no S3, no S4, no clicks, no rubs, no murmurs ABD:  Flat, positive bowel sounds normal in frequency in pitch, no bruits, no rebound, no guarding, no midline pulsatile mass, no hepatomegaly, no splenomegaly EXT:  2 plus pulses throughout, no edema, no cyanosis no clubbing SKIN:  No rashes no nodules NEURO:  Cranial nerves II through XII grossly intact, motor grossly intact throughout PSYCH:  Cognitively intact, oriented to person place and time  Wt Readings from Last 3 Encounters:  05/20/23 156 lb 9.6 oz (71 kg)  03/01/23 154 lb 4 oz (70 kg)  10/05/22 151 lb 9.6 oz (68.8 kg)     ASSESSMENT AND PLAN: .    # HFpEF:  # Hypertension Well controlled with home readings around 117/67. Patient is on Lisinopril, Chlorthalidone, Losartan, and Metoprolol.  Grade 2 diastolic dysfunction on echo.  She is euvolemic.  -Continue current medications. (amlodipine, chlorthalidone, losartan, metoprolol and spironolactone)  # Hyperlipidemia Patient on Zetia and Pravastatin. Patient admits to inconsistent medication adherence leading to fluctuating cholesterol levels. -Encouraged patient to set reminders to improve medication adherence.   # Knee Pain Patient reports knee pain limiting her activity. No current treatment plan in place. Patient has seen an orthopedic doctor who advised intervention when pain becomes unbearable. -No changes to current management plan.   General Health Maintenance -Flu shot scheduled at another office.        Dispo:  FU with Trip Cavanagh C. Duke Salvia, MD, [facility] in 1 year.  I,Mathew Stumpf,acting as a Neurosurgeon for Chilton Si, MD.,have documented all relevant documentation on the behalf of Chilton Si, MD,as directed by  Chilton Si, MD while in the presence  of Chilton Si, MD.  I, Taym Twist C. Duke Salvia, MD have reviewed all documentation for this visit.  The documentation of the exam, diagnosis, procedures, and orders on 06/03/2023 are all accurate and complete.   Signed, Chilton Si, MD

## 2023-05-20 NOTE — Patient Instructions (Signed)
Medication Instructions:  Your physician recommends that you continue on your current medications as directed. Please refer to the Current Medication list given to you today.   Follow-Up: At [facility], you and your health needs are our priority.  As part of our continuing mission to provide you with exceptional heart care, we have created designated Provider Care Teams.  These Care Teams include your primary Cardiologist (physician) and Advanced Practice Providers (APPs -  Physician Assistants and Nurse Practitioners) who all work together to provide you with the care you need, when you need it.  We recommend signing up for the patient portal called "MyChart".  Sign up information is provided on this After Visit Summary.  MyChart is used to connect with patients for Virtual Visits (Telemedicine).  Patients are able to view lab/test results, encounter notes, upcoming appointments, etc.  Non-urgent messages can be sent to your provider as well.   To learn more about what you can do with MyChart, go to [email or web address].    Your next appointment:   Follow up in one year with Dr. Duke Salvia

## 2023-05-20 NOTE — Progress Notes (Deleted)
  Cardiology Office Note:  .   Date:  05/20/2023  ID:  Katherine Koch, DOB 02-Aug-1946, MRN [id number] PCP: Wanda Plump, MD  [facility] Providers Cardiologist:  Chilton Si, MD { Click to update primary MD,subspecialty MD or APP then REFRESH:1}    History of Present Illness: .   Katherine Koch is a 77 y.o. female with resistent hypertension, hyperlipidemia, and LVH who presents for follow up.  Katherine Koch was previously a patient of Dr. Shirlee Latch.  She was last seen 06/2016.  She had an echo 03/2014 that revealed LVEF 60-65% with grade 2 diastolic dysfunction.  She had severe left ventricular hypertrophy which was thought to be due to poorly controlled hypertension.  Her PCP recommended that she restart a statin but was hesitant to start atorvastatin because she thinks this caused her mother to develop lymphoma.  She also is resistant to rosuvastatin as she is thinks this caused her to have yeast infections.  She did consent to restarting pravastatin, though this has not been effective at getting her to goal in the past.   At her last appointment her lipids were poorly controled but she wanted to work on diet and exercise. There was slight improvement on her labs 02/2021. Pravastatin was increased to 80 mg and Zetia was added.  At her appointment 04/2021 she was feeling well.  Blood pressure was elevated in the office but controlled at home.  Recommended getting a calcium score but she declined.  She follow-up with Katherine Shields, NP 08/2022 and noted that blood pressures remain better controlled at home.  ROS: ***  Studies Reviewed: .        *** Risk Assessment/Calculations:   {Does this patient have ATRIAL FIBRILLATION?[phone]} No BP recorded.  {Refresh Note OR Click here to enter BP  :1}***       Physical Exam:   VS:  There were no vitals taken for this visit.   Wt Readings from Last 3 Encounters:  03/01/23 154 lb 4 oz (70 kg)  10/05/22 151 lb 9.6 oz (68.8 kg)  09/09/22 155 lb  4.8 oz (70.4 kg)    GEN: Well nourished, well developed in no acute distress NECK: No JVD; No carotid bruits CARDIAC: ***RRR, no murmurs, rubs, gallops RESPIRATORY:  Clear to auscultation without rales, wheezing or rhonchi  ABDOMEN: Soft, non-tender, non-distended EXTREMITIES:  No edema; No deformity   ASSESSMENT AND PLAN: .   ***    {Are you ordering a CV Procedure (e.g. stress test, cath, DCCV, TEE, etc)?   Press F2        :[id number]}  Dispo: ***  Signed, Chilton Si, MD

## 2023-05-24 ENCOUNTER — Ambulatory Visit (INDEPENDENT_AMBULATORY_CARE_PROVIDER_SITE_OTHER): Payer: Medicare Other

## 2023-05-24 DIAGNOSIS — Z23 Encounter for immunization: Secondary | ICD-10-CM | POA: Diagnosis not present

## 2023-06-03 ENCOUNTER — Encounter (HOSPITAL_BASED_OUTPATIENT_CLINIC_OR_DEPARTMENT_OTHER): Payer: Self-pay | Admitting: Cardiovascular Disease

## 2023-06-14 ENCOUNTER — Other Ambulatory Visit: Payer: Self-pay | Admitting: Cardiovascular Disease

## 2023-07-07 ENCOUNTER — Encounter: Payer: Self-pay | Admitting: Internal Medicine

## 2023-07-07 ENCOUNTER — Other Ambulatory Visit: Payer: Self-pay | Admitting: Internal Medicine

## 2023-08-01 ENCOUNTER — Other Ambulatory Visit: Payer: Self-pay | Admitting: Cardiovascular Disease

## 2023-08-01 DIAGNOSIS — I10 Essential (primary) hypertension: Secondary | ICD-10-CM

## 2023-08-30 ENCOUNTER — Encounter: Payer: Self-pay | Admitting: Internal Medicine

## 2023-08-30 ENCOUNTER — Ambulatory Visit: Payer: Medicare Other | Admitting: Internal Medicine

## 2023-08-30 VITALS — BP 126/80 | HR 65 | Temp 98.1°F | Resp 16 | Ht 67.0 in | Wt 157.4 lb

## 2023-08-30 DIAGNOSIS — E785 Hyperlipidemia, unspecified: Secondary | ICD-10-CM

## 2023-08-30 DIAGNOSIS — I10 Essential (primary) hypertension: Secondary | ICD-10-CM | POA: Diagnosis not present

## 2023-08-30 LAB — LIPID PANEL
Cholesterol: 193 mg/dL (ref 0–200)
HDL: 55 mg/dL (ref >39.00)
LDL Cholesterol: 116 mg/dL — ABNORMAL HIGH (ref 0–99)
NonHDL: 137.7
Total CHOL/HDL Ratio: 4
Triglycerides: 110 mg/dL (ref 0.0–149.0)
VLDL: 22 mg/dL (ref 0.0–40.0)

## 2023-08-30 LAB — BASIC METABOLIC PANEL
BUN: 17 mg/dL (ref 6–23)
CO2: 28 meq/L (ref 19–32)
Calcium: 10.4 mg/dL (ref 8.4–10.5)
Chloride: 102 meq/L (ref 96–112)
Creatinine, Ser: 0.76 mg/dL (ref 0.40–1.20)
GFR: 75.53 mL/min (ref >60.00)
Glucose, Bld: 94 mg/dL (ref 70–99)
Potassium: 3.9 meq/L (ref 3.5–5.1)
Sodium: 139 meq/L (ref 135–145)

## 2023-08-30 NOTE — Patient Instructions (Addendum)
 Vaccines I recommend: Covid booster   Continue checking your blood pressure regularly Blood pressure goal:  between 110/65 and  135/85. If it is consistently higher or lower, let me know     GO TO THE LAB : Get the blood work     Next visit with me July 2025 for a physical exam    Please schedule it at the front desk

## 2023-08-30 NOTE — Progress Notes (Signed)
   Subjective:    Patient ID: Katherine Koch, female    DOB: 1946-05-11, 78 y.o.   MRN: [id number]  DOS:  08/30/2023 Type of visit - description: f/u  Since the last office visit is doing well. Denies chest pain or difficulty breathing.  No lower extremity edema.  Review of Systems See above   Past Medical History:  Diagnosis Date   Allergic rhinitis    Allergy    Arthritis    Dry eye syndrome    Heart murmur    Hyperlipemia    Hypertension    LVH (left ventricular hypertrophy) 05/14/2021   Osteopenia    per pt had a DEXA few years ago   Shingles 3/10    Past Surgical History:  Procedure Laterality Date   ABDOMINAL HYSTERECTOMY     B oophorectomy- remotely   BREAST EXCISIONAL BIOPSY Right     Current Outpatient Medications  Medication Instructions   amLODipine  (NORVASC ) 10 mg, Oral, Daily   azelastine  (ASTELIN ) 0.1 % nasal spray 2 sprays, Each Nare, 2 times daily, Use in each nostril as directed   Calcium  Carbonate-Vitamin D (CALTRATE 600+D PO) As directed   chlorthalidone  (HYGROTON ) 25 mg, Oral, Daily   ezetimibe  (ZETIA ) 10 mg, Oral, Daily   fluticasone  (FLONASE ) 50 MCG/ACT nasal spray 2 sprays, Each Nare, Daily   losartan  (COZAAR ) 50 mg, Oral, Daily   metoprolol  tartrate (LOPRESSOR ) 25 MG tablet TAKE 1/2 TABLET(12.5 MG) BY MOUTH DAILY   Multiple Vitamins-Minerals (MULTIVITAMINS) CHEW 1 tablet, Daily   potassium chloride  SA (KLOR-CON  M) 20 MEQ tablet 20 mEq, Oral, Daily   pravastatin  (PRAVACHOL ) 80 mg, Oral, Daily   spironolactone  (ALDACTONE ) 25 MG tablet TAKE 1/2 TABLET(12.5 MG) BY MOUTH TWICE DAILY       Objective:   Physical Exam BP 126/80   Pulse 65   Temp 98.1 F (36.7 C) (Oral)   Resp 16   Ht 5' 7 (1.702 m)   Wt 157 lb 6 oz (71.4 kg)   SpO2 97%   BMI 24.65 kg/m  General:   Well developed, NAD, BMI noted. HEENT:  Normocephalic . Face symmetric, atraumatic Lungs:  CTA B Normal respiratory effort, no intercostal retractions, no accessory muscle  use. Heart: RRR,  no murmur.  Lower extremities: no pretibial edema bilaterally  Skin: Not pale. Not jaundice Neurologic:  alert & oriented X3.  Speech normal, gait appropriate for age and unassisted Psych--  Cognition and judgment appear intact.  Cooperative with normal attention span and concentration.  Behavior appropriate. No anxious or depressed appearing.      Assessment   Problem list Hyperglycemia: A1C 6.0  (05-2016) HTN LVH /HFpEF: Per echo 2015, 2021 (LVH mild-moderate): Hyperlipidemia Osteopenia : 02-2010: T score -2.4;  Tscore -1.1 2015: Tscore -2.0; 01-2022 T-score -1.0 Allergic rhinitis Shingles ~2010 Dry eye syndrome  Palpable abdominal aorta: US  abdomen  12-2015: no AAA Gallbladder adenomatosis:  US  05-2022: No follow-up necessary Hepatic asteatosis: Noted during GB US   PLAN: YUW:Jfalojunmb BPs 120/70, BP today is very good, continue chlorthalidone , losartan , metoprolol , potassium, Aldactone .  Check BMP. Dyslipidemia: On Zetia , Pravachol .  Compliance is good although not perfect.  Check FLP. HFpEF: Cardiology OV 05/20/2023, no changes made. Preventive care: Had a flu shot, benefits of COVID-vaccine discussed with the patient. RTC 6 months CPX

## 2023-08-30 NOTE — Assessment & Plan Note (Signed)
 YUW:Jfalojunmb BPs 120/70, BP today is very good, continue chlorthalidone , losartan , metoprolol , potassium, Aldactone .  Check BMP. Dyslipidemia: On Zetia , Pravachol .  Compliance is good although not perfect.  Check FLP. HFpEF: Cardiology OV 05/20/2023, no changes made. Preventive care: Had a flu shot, benefits of COVID-vaccine discussed with the patient. RTC 6 months CPX

## 2023-09-13 ENCOUNTER — Telehealth: Payer: Self-pay | Admitting: Cardiovascular Disease

## 2023-09-13 DIAGNOSIS — I10 Essential (primary) hypertension: Secondary | ICD-10-CM

## 2023-09-13 NOTE — Telephone Encounter (Signed)
*  STAT* If patient is at the pharmacy, call can be transferred to refill team.   1. Which medications need to be refilled? (please list name of each medication and dose if known)    amLODipine (NORVASC) 10 MG tablet  chlorthalidone (HYGROTON) 25 MG tablet   losartan (COZAAR) 50 MG tablet    metoprolol tartrate (LOPRESSOR) 25 MG tablet    potassium chloride SA (KLOR-CON M) 20 MEQ tablet   spironolactone (ALDACTONE) 25 MG tablet   4. Which pharmacy/location (including street and city if local pharmacy) is medication to be sent to?   [id number], [place] - [address] Phone: [phone]  Fax: [phone]     5. Do they need a 30 day or 90 day supply? 90

## 2023-09-14 MED ORDER — POTASSIUM CHLORIDE CRYS ER 20 MEQ PO TBCR: 20.0000 meq | EXTENDED_RELEASE_TABLET | Freq: Every day | ORAL | 2 refills | Status: DC

## 2023-09-14 MED ORDER — SPIRONOLACTONE 25 MG PO TABS: ORAL_TABLET | ORAL | 2 refills | Status: DC

## 2023-09-14 MED ORDER — LOSARTAN POTASSIUM 50 MG PO TABS: 50.0000 mg | ORAL_TABLET | Freq: Every day | ORAL | 1 refills | Status: DC

## 2023-09-14 MED ORDER — METOPROLOL TARTRATE 25 MG PO TABS: ORAL_TABLET | ORAL | 3 refills | Status: DC

## 2023-09-14 MED ORDER — CHLORTHALIDONE 25 MG PO TABS: 25.0000 mg | ORAL_TABLET | Freq: Every day | ORAL | 3 refills | Status: DC

## 2023-09-14 MED ORDER — AMLODIPINE BESYLATE 10 MG PO TABS: 10.0000 mg | ORAL_TABLET | Freq: Every day | ORAL | 3 refills | Status: DC

## 2023-09-14 NOTE — Telephone Encounter (Signed)
Medication sent.

## 2023-11-14 ENCOUNTER — Telehealth: Payer: Self-pay

## 2023-11-14 NOTE — Telephone Encounter (Signed)
 Copied from CRM [id number]. Topic: Clinical - Medical Advice >> Nov 14, 2023 10:37 AM Melissa C wrote: Reason for CRM: patient stated she has been having swelling in her foot and some pain in it. Reminded her of like when people say they have gout. She took some advil with it over the weekend but doesn't want to take anymore. I offered to have her speak with a nurse but she refused, she said it seems like it's trying to get better but she wants to talk with Dr. Drue Novel about it because she never experienced this before. Please advise with patient. Thank you

## 2023-11-14 NOTE — Telephone Encounter (Signed)
 Pt scheduled for tomorrow

## 2023-11-15 ENCOUNTER — Encounter: Payer: Self-pay | Admitting: Internal Medicine

## 2023-11-15 ENCOUNTER — Ambulatory Visit: Admitting: Internal Medicine

## 2023-11-15 VITALS — BP 126/82 | HR 63 | Temp 98.2°F | Resp 16 | Ht 67.0 in | Wt 159.2 lb

## 2023-11-15 DIAGNOSIS — M25471 Effusion, right ankle: Secondary | ICD-10-CM

## 2023-11-15 LAB — COMPREHENSIVE METABOLIC PANEL WITH GFR
ALT: 20 U/L (ref 0–35)
AST: 21 U/L (ref 0–37)
Albumin: 4.8 g/dL (ref 3.5–5.2)
Alkaline Phosphatase: 55 U/L (ref 39–117)
BUN: 16 mg/dL (ref 6–23)
CO2: 30 meq/L (ref 19–32)
Calcium: 10.2 mg/dL (ref 8.4–10.5)
Chloride: 105 meq/L (ref 96–112)
Creatinine, Ser: 0.73 mg/dL (ref 0.40–1.20)
GFR: 79.15 mL/min (ref >60.00)
Glucose, Bld: 96 mg/dL (ref 70–99)
Potassium: 4.6 meq/L (ref 3.5–5.1)
Sodium: 142 meq/L (ref 135–145)
Total Bilirubin: 0.8 mg/dL (ref 0.2–1.2)
Total Protein: 7.6 g/dL (ref 6.0–8.3)

## 2023-11-15 LAB — URIC ACID: Uric Acid, Serum: 5 mg/dL (ref 2.4–7.0)

## 2023-11-15 MED ORDER — COLCHICINE 0.6 MG PO CAPS
1.0000 | ORAL_CAPSULE | Freq: Two times a day (BID) | ORAL | 0 refills | Status: DC | PRN
Start: 2023-11-15 — End: 2023-11-16

## 2023-11-15 NOTE — Progress Notes (Signed)
 Subjective:    Patient ID: Katherine Koch, female    DOB: Jun 17, 1946, 78 y.o.   MRN: [id number]  DOS:  11/15/2023 Type of visit - description: Acute  5 days ago developed swelling and pain around the right ankle. Does not recall any injury. Has not done more walking  than usual. Took ibuprofen 1 daily for 3 days and overall she feels better.   Review of Systems See above   Past Medical History:  Diagnosis Date   Allergic rhinitis    Allergy    Arthritis    Dry eye syndrome    Heart murmur    Hyperlipemia    Hypertension    LVH (left ventricular hypertrophy) 05/14/2021   Osteopenia    per pt had a DEXA few years ago   Shingles 3/10    Past Surgical History:  Procedure Laterality Date   ABDOMINAL HYSTERECTOMY     B oophorectomy- remotely   BREAST EXCISIONAL BIOPSY Right     Current Outpatient Medications  Medication Instructions   amLODipine (NORVASC) 10 mg, Oral, Daily   azelastine (ASTELIN) 0.1 % nasal spray 2 sprays, Each Nare, 2 times daily, Use in each nostril as directed   Calcium Carbonate-Vitamin D (CALTRATE 600+D PO) As directed   chlorthalidone (HYGROTON) 25 mg, Oral, Daily   ezetimibe (ZETIA) 10 mg, Oral, Daily   fluticasone (FLONASE) 50 MCG/ACT nasal spray 2 sprays, Each Nare, Daily   losartan (COZAAR) 50 mg, Oral, Daily   metoprolol tartrate (LOPRESSOR) 25 MG tablet TAKE 1/2 TABLET(12.5 MG) BY MOUTH DAILY   Multiple Vitamins-Minerals (MULTIVITAMINS) CHEW 1 tablet, Daily   potassium chloride SA (KLOR-CON M) 20 MEQ tablet 20 mEq, Oral, Daily   pravastatin (PRAVACHOL) 80 mg, Oral, Daily   spironolactone (ALDACTONE) 25 MG tablet TAKE 1/2 TABLET(12.5 MG) BY MOUTH TWICE DAILY       Objective:   Physical Exam Musculoskeletal:       Feet:    BP 126/82   Pulse 63   Temp 98.2 F (36.8 C) (Oral)   Resp 16   Ht 5\' 7"  (1.702 m)   Wt 159 lb 4 oz (72.2 kg)   SpO2 97%   BMI 24.94 kg/m  General:   Well developed, NAD, BMI noted. HEENT:  Normocephalic  . Face symmetric, atraumatic MSK. Left foot normal. Right foot: See graphic  skin: Not pale. Not jaundice Neurologic:  alert & oriented X3.  Speech normal, gait appropriate for age and unassisted.  Not limping Psych--  Cognition and judgment appear intact.  Cooperative with normal attention span and concentration.  Behavior appropriate. No anxious or depressed appearing.      Assessment   Problem list Hyperglycemia: A1C 6.0  (05-2016) HTN LVH /HFpEF: Per echo 2015, 2021 (LVH mild-moderate): Hyperlipidemia:  Declined to try atorvastatin before, 2018: s/e w/ crestor , ok w/ pravastatin, zetia added 2022 Osteopenia : 02-2010: T score -2.4;  Tscore -1.1 2015: Tscore -2.0; 01-2022 T-score -1.0 Allergic rhinitis Shingles ~2010 Dry eye syndrome  Palpable abdominal aorta: US abdomen  12-2015: no AAA Gallbladder adenomatosis:  [place] 05-2022: No follow-up necessary Hepatic asteatosis: Noted during GB US  PLAN: Right ankle swelling: Swelling pain and warmness at the right ankle acutely, improve with some ibuprofen.  Exam is not consistent with cellulitis, no history of injury. This could be a first gout episode, she has multiple questions about possible gout. Plan: CMP, uric acid. Although I am not completely certain she had gout, we will try empiric colchicine.  Appropriate gout diet provided. Next appointment 02/28/2024

## 2023-11-15 NOTE — Assessment & Plan Note (Signed)
 Right ankle swelling: Swelling pain and warmness at the right ankle acutely, improve with some ibuprofen.  Exam is not consistent with cellulitis, no history of injury. This could be a first gout episode, she has multiple questions about possible gout. Plan: CMP, uric acid. Although I am not completely certain she had gout, we will try empiric colchicine.  Appropriate gout diet provided. Next appointment 02/28/2024

## 2023-11-15 NOTE — Patient Instructions (Addendum)
 The swelling is possibly gout.  Proceed with blood work  Ice twice daily  Start colchicine twice daily until completely better  If this is not improving, gets worse, you have fever chills: Call the office   Next appointment scheduled for July

## 2023-11-16 NOTE — Addendum Note (Signed)
 Addended byConrad [place] D on: 11/16/2023 03:43 PM   Modules accepted: Orders

## 2024-02-02 ENCOUNTER — Other Ambulatory Visit: Payer: Self-pay | Admitting: Internal Medicine

## 2024-02-02 MED ORDER — FLUTICASONE PROPIONATE 50 MCG/ACT NA SUSP: 2.0000 | Freq: Every day | NASAL | 5 refills | Status: AC

## 2024-02-02 NOTE — Telephone Encounter (Signed)
 Copied from CRM [id number]. Topic: Clinical - Medication Refill >> Feb 02, 2024  8:34 AM Jenice Mitts wrote: Medication: fluticasone  (FLONASE ) 50 MCG/ACT nasal spray   Has the patient contacted their pharmacy? Yes (Agent: If no, request that the patient contact the pharmacy for the refill. If patient does not wish to contact the pharmacy document the reason why and proceed with request.) (Agent: If yes, when and what did the pharmacy advise?)  This is the patient's preferred pharmacy:  [id number] [place], [place] - [address] [place] [address] Phone: [phone] Fax: [phone]  Is this the correct pharmacy for this prescription? Yes If no, delete pharmacy and type the correct one.   Has the prescription been filled recently? No  Is the patient out of the medication? Yes  Has the patient been seen for an appointment in the last year OR does the patient have an upcoming appointment? Yes  Can we respond through MyChart? No  Agent: Please be advised that Rx refills may take up to 3 business days. We ask that you follow-up with your pharmacy.

## 2024-02-15 ENCOUNTER — Telehealth: Payer: Self-pay | Admitting: Internal Medicine

## 2024-02-15 NOTE — Telephone Encounter (Signed)
 Copied from CRM [id number]. Topic: Medicare AWV >> Feb 15, 2024  1:56 PM Nathanel DEL wrote: Reason for CRM: Called 02/15/2024 to sched AWV - Line Busy   Nathanel Paschal; Care Guide Ambulatory Clinical Support [place] [facility] Direct Dial: [phone]

## 2024-02-28 ENCOUNTER — Ambulatory Visit: Payer: Medicare Other | Admitting: Internal Medicine

## 2024-03-02 ENCOUNTER — Ambulatory Visit: Admitting: Internal Medicine

## 2024-03-02 ENCOUNTER — Telehealth: Payer: Self-pay

## 2024-03-02 ENCOUNTER — Encounter: Payer: Self-pay | Admitting: Internal Medicine

## 2024-03-02 VITALS — BP 122/66 | HR 63 | Temp 97.8°F | Resp 16 | Ht 67.0 in | Wt 159.5 lb

## 2024-03-02 DIAGNOSIS — R9230 Dense breasts, unspecified: Secondary | ICD-10-CM

## 2024-03-02 DIAGNOSIS — I5030 Unspecified diastolic (congestive) heart failure: Secondary | ICD-10-CM

## 2024-03-02 DIAGNOSIS — R739 Hyperglycemia, unspecified: Secondary | ICD-10-CM

## 2024-03-02 DIAGNOSIS — I10 Essential (primary) hypertension: Secondary | ICD-10-CM | POA: Diagnosis not present

## 2024-03-02 DIAGNOSIS — E785 Hyperlipidemia, unspecified: Secondary | ICD-10-CM

## 2024-03-02 DIAGNOSIS — Z2821 Immunization not carried out because of patient refusal: Secondary | ICD-10-CM

## 2024-03-02 DIAGNOSIS — Z1231 Encounter for screening mammogram for malignant neoplasm of breast: Secondary | ICD-10-CM

## 2024-03-02 LAB — BASIC METABOLIC PANEL WITH GFR
BUN: 12 mg/dL (ref 6–23)
CO2: 27 meq/L (ref 19–32)
Calcium: 9.8 mg/dL (ref 8.4–10.5)
Chloride: 101 meq/L (ref 96–112)
Creatinine, Ser: 0.7 mg/dL (ref 0.40–1.20)
GFR: 83.07 mL/min (ref >60.00)
Glucose, Bld: 92 mg/dL (ref 70–99)
Potassium: 4.1 meq/L (ref 3.5–5.1)
Sodium: 138 meq/L (ref 135–145)

## 2024-03-02 LAB — HEMOGLOBIN A1C: Hgb A1c MFr Bld: 6.3 % (ref 4.6–6.5)

## 2024-03-02 LAB — LIPID PANEL
Cholesterol: 155 mg/dL (ref 0–200)
HDL: 51.6 mg/dL (ref >39.00)
LDL Cholesterol: 84 mg/dL (ref 0–99)
NonHDL: 103.8
Total CHOL/HDL Ratio: 3
Triglycerides: 97 mg/dL (ref 0.0–149.0)
VLDL: 19.4 mg/dL (ref 0.0–40.0)

## 2024-03-02 LAB — CBC WITH DIFFERENTIAL/PLATELET
Basophils Absolute: 0.1 K/uL (ref 0.0–0.1)
Basophils Relative: 1.6 % (ref 0.0–3.0)
Eosinophils Absolute: 0.1 K/uL (ref 0.0–0.7)
Eosinophils Relative: 2.2 % (ref 0.0–5.0)
HCT: 40.8 % (ref 36.0–46.0)
Hemoglobin: 13.5 g/dL (ref 12.0–15.0)
Lymphocytes Relative: 29.8 % (ref 12.0–46.0)
Lymphs Abs: 1.3 K/uL (ref 0.7–4.0)
MCHC: 33.1 g/dL (ref 30.0–36.0)
MCV: 86.6 fl (ref 78.0–100.0)
Monocytes Absolute: 0.3 K/uL (ref 0.1–1.0)
Monocytes Relative: 7.3 % (ref 3.0–12.0)
Neutro Abs: 2.5 K/uL (ref 1.4–7.7)
Neutrophils Relative %: 59.1 % (ref 43.0–77.0)
Platelets: 254 K/uL (ref 150.0–400.0)
RBC: 4.71 Mil/uL (ref 3.87–5.11)
RDW: 13.4 % (ref 11.5–15.5)
WBC: 4.3 K/uL (ref 4.0–10.5)

## 2024-03-02 NOTE — Telephone Encounter (Signed)
 Orders were placed as dense breast tissue. Will change to screening mammogram.

## 2024-03-02 NOTE — Telephone Encounter (Signed)
 The patient reported diagnosis of dense breast tissue. If that does not qualify for ultrasound we will have to start with a mammogram

## 2024-03-02 NOTE — Patient Instructions (Addendum)
 Stretch before you take a walk Stay hydrated For aches and pains okay to take Tylenol as needed.   Avoid ibuprofen or naproxen  We are referring you for a mammogram and ultrasound  Check the  blood pressure regularly Blood pressure goal:  between 110/65 and  135/85. If it is consistently higher or lower, let me know     GO TO THE LAB :  Get the blood work   Your results will be posted on MyChart with my comments  Next office visit for a physical exam in 5 to 6 months. Please make an appointment before you leave today

## 2024-03-02 NOTE — Progress Notes (Signed)
 Subjective:    Patient ID: Katherine Koch, female    DOB: 08/20/1945, 78 y.o.   MRN: [id number]  DOS:  03/02/2024 Type of visit - description: Routine checkup  Since the last office visit is doing well. Good med compliance. Still has occasional aches and pains, mostly at the base of the right thumb.  Denies chest pain or difficulty breathing Occasionally has mild peri-ankle edema. Ambulatory BPs okay.  Review of Systems See above   Past Medical History:  Diagnosis Date   Allergic rhinitis    Allergy    Arthritis    Dry eye syndrome    Heart murmur    Hyperlipemia    Hypertension    LVH (left ventricular hypertrophy) 05/14/2021   Osteopenia    per pt had a DEXA few years ago   Shingles 3/10    Past Surgical History:  Procedure Laterality Date   ABDOMINAL HYSTERECTOMY     B oophorectomy- remotely   BREAST EXCISIONAL BIOPSY Right     Current Outpatient Medications  Medication Instructions   amLODipine  (NORVASC ) 10 mg, Oral, Daily   azelastine  (ASTELIN ) 0.1 % nasal spray 2 sprays, Each Nare, 2 times daily, Use in each nostril as directed   Calcium  Carbonate-Vitamin D (CALTRATE 600+D PO) As directed   chlorthalidone  (HYGROTON ) 25 mg, Oral, Daily   ezetimibe  (ZETIA ) 10 mg, Oral, Daily   fluticasone  (FLONASE ) 50 MCG/ACT nasal spray 2 sprays, Each Nare, Daily   losartan  (COZAAR ) 50 mg, Oral, Daily   metoprolol  tartrate (LOPRESSOR ) 25 MG tablet TAKE 1/2 TABLET(12.5 MG) BY MOUTH DAILY   Multiple Vitamins-Minerals (MULTIVITAMINS) CHEW 1 tablet, Daily   potassium chloride  SA (KLOR-CON  M) 20 MEQ tablet 20 mEq, Oral, Daily   pravastatin  (PRAVACHOL ) 80 mg, Oral, Daily   spironolactone  (ALDACTONE ) 25 MG tablet TAKE 1/2 TABLET(12.5 MG) BY MOUTH TWICE DAILY       Objective:   Physical Exam BP 122/66   Pulse 63   Temp 97.8 F (36.6 C) (Oral)   Resp 16   Ht 5' 7 (1.702 m)   Wt 159 lb 8 oz (72.3 kg)   SpO2 97%   BMI 24.98 kg/m  General:   Well developed, NAD, BMI  noted. HEENT:  Normocephalic . Face symmetric, atraumatic. Neck: No thyromegaly Lungs:  CTA B Normal respiratory effort, no intercostal retractions, no accessory muscle use. Heart: RRR,  no murmur.  Lower extremities: no pretibial edema bilaterally  Skin: Not pale. Not jaundice Neurologic:  alert & oriented X3.  Speech normal, gait appropriate for age and unassisted Psych--  Cognition and judgment appear intact.  Cooperative with normal attention span and concentration.  Behavior appropriate. No anxious or depressed appearing.      Assessment     Problem list Hyperglycemia: A1C 6.0  (05-2016) HTN LVH /HFpEF: Per echo 2015, 2021 (LVH mild-moderate): Hyperlipidemia:  Declined to try atorvastatin before, 2018: s/e w/ crestor  , ok w/ pravastatin , zetia  added 2022 Osteopenia : 02-2010: T score -2.4;  Tscore -1.1 2015: Tscore -2.0; 01-2022 T-score -1.0 Allergic rhinitis Shingles ~2010 Dry eye syndrome  Palpable abdominal aorta: US  abdomen  12-2015: no AAA Gallbladder adenomatosis:  US  05-2022: No follow-up necessary Hepatic asteatosis: Noted during GB US   PLAN: Hyperglycemia: Check A1c. HFpEF: No evidence of volume overload, no symptoms, continue amlodipine , chlorthalidone , losartan , metoprolol , potassium, Aldactone .  Check BMP HTN: See medications above, seems controlled, check CBC. Hyperlipidemia: Declined atorvastatin, history of side effects with Crestor , on pravastatin  and Zetia , check FLP. Preventive care:  Has declined vaccines consistently MMG with ultrasound due to dense breast tissue per pt)  ordered. RTC 5 to 6 months

## 2024-03-02 NOTE — Telephone Encounter (Signed)
 Copied from CRM [id number]. Topic: General - Other >> Mar 02, 2024  3:23 PM Berneda FALCON wrote: Reason for CRM: [facility] states that the Diagnostic ultrasound needs a valid diagnosis as in the problem that the patient is having (lump, pain, etc.).  [place] [facility] [place]-[phone] Ext [phone]

## 2024-03-03 NOTE — Assessment & Plan Note (Signed)
 Hyperglycemia: Check A1c. HFpEF: No evidence of volume overload, no symptoms, continue amlodipine , chlorthalidone , losartan , metoprolol , potassium, Aldactone .  Check BMP HTN: See medications above, seems controlled, check CBC. Hyperlipidemia: Declined atorvastatin, history of side effects with Crestor , on pravastatin  and Zetia , check FLP. Preventive care: Has declined vaccines consistently MMG with ultrasound due to dense breast tissue per pt)  ordered. RTC 5 to 6 months

## 2024-03-05 ENCOUNTER — Ambulatory Visit: Payer: Self-pay | Admitting: Internal Medicine

## 2024-03-27 ENCOUNTER — Ambulatory Visit
Admission: RE | Admit: 2024-03-27 | Discharge: 2024-03-27 | Disposition: A | Discharge status: Home / Self Care | Source: Ambulatory Visit | Attending: Internal Medicine | Admitting: Internal Medicine

## 2024-05-22 ENCOUNTER — Ambulatory Visit (INDEPENDENT_AMBULATORY_CARE_PROVIDER_SITE_OTHER)

## 2024-05-22 ENCOUNTER — Other Ambulatory Visit: Payer: Self-pay | Admitting: Internal Medicine

## 2024-05-22 DIAGNOSIS — Z23 Encounter for immunization: Secondary | ICD-10-CM | POA: Diagnosis not present

## 2024-05-22 MED ORDER — PRAVASTATIN SODIUM 80 MG PO TABS: 80.0000 mg | ORAL_TABLET | Freq: Every day | ORAL | 1 refills | Status: AC

## 2024-05-22 NOTE — Telephone Encounter (Signed)
 Copied from CRM #[id number]. Topic: Clinical - Medication Refill >> May 22, 2024 10:26 AM Katherine Koch wrote: Medication: pravastatin  (PRAVACHOL ) 80 MG tablet  Has the patient contacted their pharmacy? Yes She says they says told her to call up   This is the patient's preferred pharmacy:  [id number] [address], [place] - [address] [place] [address] Phone: [phone] Fax: [phone]  Is this the correct pharmacy for this prescription? Yes If no, delete pharmacy and type the correct one.   Has the prescription been filled recently? Yes  Is the patient out of the medication? Yes  Has the patient been seen for an appointment in the last year OR does the patient have an upcoming appointment? Yes  Can we respond through MyChart? No  Agent: Please be advised that Rx refills may take up to 3 business days. We ask that you follow-up with your pharmacy.

## 2024-05-27 ENCOUNTER — Other Ambulatory Visit: Payer: Self-pay | Admitting: Cardiovascular Disease

## 2024-06-05 ENCOUNTER — Encounter (HOSPITAL_BASED_OUTPATIENT_CLINIC_OR_DEPARTMENT_OTHER): Payer: Self-pay | Admitting: Cardiovascular Disease

## 2024-06-05 ENCOUNTER — Ambulatory Visit (HOSPITAL_BASED_OUTPATIENT_CLINIC_OR_DEPARTMENT_OTHER): Admitting: Cardiovascular Disease

## 2024-06-05 VITALS — BP 124/70 | HR 56 | Ht 67.75 in | Wt 156.2 lb

## 2024-06-05 DIAGNOSIS — G4733 Obstructive sleep apnea (adult) (pediatric): Secondary | ICD-10-CM | POA: Diagnosis not present

## 2024-06-05 DIAGNOSIS — E785 Hyperlipidemia, unspecified: Secondary | ICD-10-CM

## 2024-06-05 DIAGNOSIS — I10 Essential (primary) hypertension: Secondary | ICD-10-CM | POA: Diagnosis not present

## 2024-06-05 DIAGNOSIS — I517 Cardiomegaly: Secondary | ICD-10-CM | POA: Diagnosis not present

## 2024-06-05 MED ORDER — AMLODIPINE BESYLATE 10 MG PO TABS: 10.0000 mg | ORAL_TABLET | Freq: Every day | ORAL | 3 refills | Status: AC

## 2024-06-05 MED ORDER — LOSARTAN POTASSIUM 50 MG PO TABS: 50.0000 mg | ORAL_TABLET | Freq: Every day | ORAL | 3 refills | Status: AC

## 2024-06-05 MED ORDER — POTASSIUM CHLORIDE CRYS ER 20 MEQ PO TBCR: 20.0000 meq | EXTENDED_RELEASE_TABLET | Freq: Every day | ORAL | 3 refills | Status: AC

## 2024-06-05 MED ORDER — CHLORTHALIDONE 25 MG PO TABS: 25.0000 mg | ORAL_TABLET | Freq: Every day | ORAL | 3 refills | Status: AC

## 2024-06-05 MED ORDER — SPIRONOLACTONE 25 MG PO TABS: ORAL_TABLET | ORAL | 3 refills | Status: AC

## 2024-06-05 MED ORDER — METOPROLOL TARTRATE 25 MG PO TABS: ORAL_TABLET | ORAL | 3 refills | Status: AC

## 2024-06-05 NOTE — Progress Notes (Unsigned)
 Cardiology Office Note:  .   Date:  06/05/2024  ID:  Katherine Koch, DOB 1946-03-31, MRN [id number] PCP: Amon Aloysius BRAVO, MD  [facility] Providers Cardiologist:  Annabella Scarce, MD { Click to update primary MD,subspecialty MD or APP then REFRESH:1}   History of Present Illness: .    Navpreet Szczygiel is a 78 y.o. female with resistent hypertension, hyperlipidemia, and LVH who presents for follow up.  Ms. Ferrante was previously a patient of Dr. Rolan.  She was last seen 06/2016.  She had an echo 03/2014 that revealed LVEF 60-65% with grade 2 diastolic dysfunction.  She had severe left ventricular hypertrophy which was thought to be due to poorly controlled hypertension.  Her PCP recommended that she restart a statinbut was hesitant to start atorvastatin because she thinks this caused her mother to develop lymphoma.  She also is resistant to rosuvastatin  as she is thinks this caused her to have yeast infections.  She did consent to restarting pravastatin , though this has not been effective at getting her to goal in the past.   In 04/2020, her lipids were poorly controled but she wanted to work on diet and exercise. There was slight improvement on her labs 02/2021. Pravastatin  was increased to 80 mg and Zetia  was added.  At her appointment 04/2021 she was feeling well.  Blood pressure was elevated in the office but controlled at home.  Recommended getting a calcium  score but she declined.  She followed-up with Reche Finder, NP 08/2022 and noted that blood pressures remain better controlled at home.   Today, she states she is feeling good with no new cardiovascular complaints. In the office her blood pressure is 133/75 initially, and 124/62 on manual recheck. She reports stable blood pressures at home. Due to her knee pain she has not been very active, but continues to go out on daily errands at least 5 days a week which consists of some walking. We reviewed her lipid panel 02/2023 showing LDL 124 (prior  93). She admits to occasionally forgetting to take her Zetia . She denies any palpitations, chest pain, shortness of breath, lightheadedness, headaches, syncope, orthopnea, or PND.    Discussed the use of AI scribe software for clinical note transcription with the patient, who gave verbal consent to proceed.  History of Present Illness     ROS:  As per HPI  Studies Reviewed: SABRA   EKG Interpretation Date/Time:  Tuesday June 05 2024 13:26:17 EDT Ventricular Rate:  56 PR Interval:  176 QRS Duration:  96 QT Interval:  468 QTC Calculation: 451 R Axis:   56  Text Interpretation: Sinus bradycardia Left ventricular hypertrophy with early repolarization abnormality No previous ECGs available Confirmed by Scarce Annabella ([id number]) on 06/05/2024 1:41:44 PM   Echo3/08/2022:  1. Left ventricular ejection fraction, by estimation, is 65 to 70%. The  left ventricle has normal function. The left ventricle has no regional  wall motion abnormalities. There is moderate left ventricular hypertrophy.  Left ventricular diastolic  parameters are consistent with Grade II diastolic dysfunction  (pseudonormalization).   2. Right ventricular systolic function is normal. The right ventricular  size is normal. There is normal pulmonary artery systolic pressure. The  estimated right ventricular systolic pressure is 22.9 mmHg.   3. Left atrial size was mildly dilated.   4. The mitral valve is normal in structure. Trivial mitral valve  regurgitation. No evidence of mitral stenosis. Moderate mitral annular  calcification.   5. The aortic valve is tricuspid. There  is mild calcification of the  aortic valve. Aortic valve regurgitation is not visualized. Aortic valve  sclerosis is present, with no evidence of aortic valve stenosis.   6. The inferior vena cava is normal in size with greater than 50%  respiratory variability, suggesting right atrial pressure of 3 mmHg.  *** Risk Assessment/Calculations:    {Does this patient have ATRIAL FIBRILLATION?:[phone]}         Physical Exam:   VS:  BP 124/70 (BP Location: Right Arm, Patient Position: Sitting, Cuff Size: Normal)   Pulse (!) 56   Ht 5' 7.75 (1.721 m)   Wt 156 lb 3.2 oz (70.9 kg)   SpO2 98%   BMI 23.93 kg/m  , BMI Body mass index is 23.93 kg/m. GENERAL:  Well appearing HEENT: Pupils equal round and reactive, fundi not visualized, oral mucosa unremarkable NECK:  No jugular venous distention, waveform within normal limits, carotid upstroke brisk and symmetric, no bruits, no thyromegaly LUNGS:  Clear to auscultation bilaterally HEART:  RRR.  PMI not displaced or sustained,S1 and S2 within normal limits, no S3, no S4, no clicks, no rubs, *** murmurs ABD:  Flat, positive bowel sounds normal in frequency in pitch, no bruits, no rebound, no guarding, no midline pulsatile mass, no hepatomegaly, no splenomegaly EXT:  2 plus pulses throughout, no edema, no cyanosis no clubbing SKIN:  No rashes no nodules NEURO:  Cranial nerves II through XII grossly intact, motor grossly intact throughout PSYCH:  Cognitively intact, oriented to person place and time   ASSESSMENT AND PLAN: .   *** Assessment and Plan Assessment & Plan         {Are you ordering a CV Procedure (e.g. stress test, cath, DCCV, TEE, etc)?   Press F2        :[id number]}  Dispo: ***  Signed, Annabella Scarce, MD

## 2024-06-05 NOTE — Patient Instructions (Signed)
 Medication Instructions:  Your physician recommends that you continue on your current medications as directed. Please refer to the Current Medication list given to you today.   *If you need a refill on your cardiac medications before your next appointment, please call your pharmacy*  Lab Work: LABS TODAY   If you have labs (blood work) drawn today and your tests are completely normal, you will receive your results only by: MyChart Message (if you have MyChart) OR A paper copy in the mail If you have any lab test that is abnormal or we need to change your treatment, we will call you to review the results.  Testing/Procedures: CARDIAC MRI SOON   Follow-Up: At [facility], you and your health needs are our priority.  As part of our continuing mission to provide you with exceptional heart care, our providers are all part of one team.  This team includes your primary Cardiologist (physician) and Advanced Practice Providers or APPs (Physician Assistants and Nurse Practitioners) who all work together to provide you with the care you need, when you need it.  Your next appointment:   12 month(s)  Provider:   Annabella Scarce, MD    We recommend signing up for the patient portal called MyChart.  Sign up information is provided on this After Visit Summary.  MyChart is used to connect with patients for Virtual Visits (Telemedicine).  Patients are able to view lab/test results, encounter notes, upcoming appointments, etc.  Non-urgent messages can be sent to your provider as well.   To learn more about what you can do with MyChart, go to [email or web address].   Other Instructions  [facility] [address] [place], [place] [address] Please take advantage of the free valet parking available at the [facility] and [employer] (Entrance C).  Proceed to the [facility] Radiology Department (First Floor) for check-in.   OR   [facility] [address] [place], [place] [address] Please go to the [facility] and check-in with the desk attendant.   Magnetic resonance imaging (MRI) is a painless test that produces images of the inside of the body without using Xrays.  During an MRI, strong magnets and radio waves work together in a Data processing manager to form detailed images.   MRI images may provide more details about a medical condition than X-rays, CT scans, and ultrasounds can provide.  You may be given earphones to listen for instructions.  You may eat a light breakfast and take medications as ordered with the exception of furosemide, hydrochlorothiazide , chlorthalidone  or spironolactone  (or any other fluid pill). If you are undergoing a stress MRI, please avoid stimulants for 12 hr prior to test. (I.e. Caffeine, nicotine, chocolate, or antihistamine medications)  If your provider has ordered anti-anxiety medications for this test, then you will need a driver.  An IV will be inserted into one of your veins. Contrast material will be injected into your IV. It will leave your body through your urine within a day. You may be told to drink plenty of fluids to help flush the contrast material out of your system.  You will be asked to remove all metal, including: Watch, jewelry, and other metal objects including hearing aids, hair pieces and dentures. Also wearable glucose monitoring systems (ie. Freestyle Libre and Omnipods) (Braces and fillings normally are not a problem.)   TEST WILL TAKE APPROXIMATELY 1 HOUR  PLEASE NOTIFY SCHEDULING AT LEAST 24 HOURS IN ADVANCE IF YOU ARE UNABLE  TO KEEP YOUR APPOINTMENT. [phone]  For more information and frequently asked questions, please visit our website : [email or web address]  Please call the Cardiac Imaging Nurse Navigators with any questions/concerns. [phone] Office

## 2024-06-07 ENCOUNTER — Encounter (HOSPITAL_BASED_OUTPATIENT_CLINIC_OR_DEPARTMENT_OTHER): Payer: Self-pay | Admitting: Cardiovascular Disease

## 2024-06-12 ENCOUNTER — Other Ambulatory Visit: Payer: Self-pay | Admitting: Internal Medicine

## 2024-06-12 MED ORDER — EZETIMIBE 10 MG PO TABS: 10.0000 mg | ORAL_TABLET | Freq: Every day | ORAL | 1 refills | Status: AC

## 2024-06-12 NOTE — Telephone Encounter (Signed)
 Copied from CRM [id number]. Topic: Clinical - Medication Refill >> Jun 12, 2024 11:25 AM Aleatha C wrote: Medication: ezetimibe  (ZETIA ) 10 MG tablet  Has the patient contacted their pharmacy? Yes (Agent: If no, request that the patient contact the pharmacy for the refill. If patient does not wish to contact the pharmacy document the reason why and proceed with request.) (Agent: If yes, when and what did the pharmacy advise?)  This is the patient's preferred pharmacy:  [id number] [address], [place] - [address] [place] [address] Phone: [phone] Fax: [phone]  Is this the correct pharmacy for this prescription? Yes If no, delete pharmacy and type the correct one.   Has the prescription been filled recently? No  Is the patient out of the medication? Yes  Has the patient been seen for an appointment in the last year OR does the patient have an upcoming appointment? Yes  Can we respond through MyChart? No  Agent: Please be advised that Rx refills may take up to 3 business days. We ask that you follow-up with your pharmacy.

## 2024-06-13 ENCOUNTER — Telehealth: Payer: Self-pay | Admitting: Cardiovascular Disease

## 2024-06-13 DIAGNOSIS — I517 Cardiomegaly: Secondary | ICD-10-CM

## 2024-06-13 DIAGNOSIS — I77810 Thoracic aortic ectasia: Secondary | ICD-10-CM

## 2024-06-13 DIAGNOSIS — I10 Essential (primary) hypertension: Secondary | ICD-10-CM

## 2024-06-13 NOTE — Telephone Encounter (Signed)
 Patient calling saying she doesn't feel comfortable doing the MR and wants to talk to a nurse. Please advise

## 2024-06-13 NOTE — Telephone Encounter (Signed)
 Spoke with patient regarding cardiac MRI  Stated she does not feel comfortable with doing the MRI  Asked if I could answer any question to make her feel more comfortable about having, answered no  Even discussed the fact that Dr Raford explained the options with her and she wants to just proceed with ECHO at this time  Order for Echo placed, cancelled MRI, sent message to scheduling team, and will forward to Dr Raford for review

## 2024-06-18 ENCOUNTER — Telehealth: Payer: Self-pay | Admitting: Internal Medicine

## 2024-06-18 NOTE — Telephone Encounter (Signed)
 Copied from CRM [id number]. Topic: Medicare AWV >> Jun 18, 2024 11:41 AM Nathanel DEL wrote: Reason for CRM: Called LVM 06/18/2024 to schedule AWV. Please schedule office or virtual visits  Nathanel Paschal; Care Guide Ambulatory Clinical Support [facility] Direct Dial: [phone]

## 2024-06-19 ENCOUNTER — Ambulatory Visit (HOSPITAL_COMMUNITY)
Admission: RE | Admit: 2024-06-19 | Discharge: 2024-06-19 | Disposition: A | Discharge status: Home / Self Care | Source: Ambulatory Visit | Attending: Cardiology | Admitting: Cardiology

## 2024-06-19 DIAGNOSIS — I10 Essential (primary) hypertension: Secondary | ICD-10-CM | POA: Diagnosis present

## 2024-06-19 DIAGNOSIS — I517 Cardiomegaly: Secondary | ICD-10-CM | POA: Diagnosis present

## 2024-06-19 DIAGNOSIS — I77810 Thoracic aortic ectasia: Secondary | ICD-10-CM | POA: Diagnosis present

## 2024-06-19 LAB — ECHOCARDIOGRAM COMPLETE
Area-P 1/2: 3.89 cm2
S' Lateral: 2.25 cm

## 2024-07-14 ENCOUNTER — Ambulatory Visit: Payer: Self-pay | Admitting: Cardiovascular Disease

## 2024-07-23 ENCOUNTER — Telehealth: Payer: Self-pay | Admitting: Cardiovascular Disease

## 2024-07-23 NOTE — Telephone Encounter (Signed)
Patient is returning

## 2024-07-23 NOTE — Telephone Encounter (Signed)
 Raford Riggs, MD to [name] NOVAK, LPN  Amon Aloysius BRAVO, MD (Selected Message)    07/14/24  5:45 PM Result Note Echo shows that your heart is squeezing well.  Your heart muscle is moderately thickened.  It is stable from prior.   The patient has been notified of the result and verbalized understanding.  All questions (if any) were answered. Katherine Porter HERO, LPN 87/08/7972 7:71 PM

## 2024-09-05 ENCOUNTER — Ambulatory Visit: Admitting: Internal Medicine

## 2024-09-05 ENCOUNTER — Encounter: Payer: Self-pay | Admitting: Internal Medicine

## 2024-09-05 VITALS — BP 120/84 | HR 67 | Temp 97.8°F | Resp 16 | Ht 67.75 in | Wt 158.1 lb

## 2024-09-05 DIAGNOSIS — R739 Hyperglycemia, unspecified: Secondary | ICD-10-CM | POA: Diagnosis not present

## 2024-09-05 DIAGNOSIS — K219 Gastro-esophageal reflux disease without esophagitis: Secondary | ICD-10-CM

## 2024-09-05 DIAGNOSIS — E785 Hyperlipidemia, unspecified: Secondary | ICD-10-CM | POA: Diagnosis not present

## 2024-09-05 DIAGNOSIS — I5032 Chronic diastolic (congestive) heart failure: Secondary | ICD-10-CM | POA: Diagnosis not present

## 2024-09-05 DIAGNOSIS — Z0000 Encounter for general adult medical examination without abnormal findings: Secondary | ICD-10-CM | POA: Diagnosis not present

## 2024-09-05 DIAGNOSIS — I10 Essential (primary) hypertension: Secondary | ICD-10-CM | POA: Diagnosis not present

## 2024-09-05 DIAGNOSIS — Z0001 Encounter for general adult medical examination with abnormal findings: Secondary | ICD-10-CM | POA: Diagnosis not present

## 2024-09-05 LAB — HEMOGLOBIN A1C: Hgb A1c MFr Bld: 6.1 % (ref 4.6–6.5)

## 2024-09-05 LAB — BASIC METABOLIC PANEL WITH GFR
BUN: 14 mg/dL (ref 6–23)
CO2: 30 meq/L (ref 19–32)
Calcium: 10.3 mg/dL (ref 8.4–10.5)
Chloride: 102 meq/L (ref 96–112)
Creatinine, Ser: 0.74 mg/dL (ref 0.40–1.20)
GFR: 77.43 mL/min
Glucose, Bld: 99 mg/dL (ref 70–99)
Potassium: 4.3 meq/L (ref 3.5–5.1)
Sodium: 139 meq/L (ref 135–145)

## 2024-09-05 LAB — CBC WITH DIFFERENTIAL/PLATELET
Basophils Absolute: 0.1 K/uL (ref 0.0–0.1)
Basophils Relative: 1.4 % (ref 0.0–3.0)
Eosinophils Absolute: 0.3 K/uL (ref 0.0–0.7)
Eosinophils Relative: 6.1 % — ABNORMAL HIGH (ref 0.0–5.0)
HCT: 41.4 % (ref 36.0–46.0)
Hemoglobin: 13.9 g/dL (ref 12.0–15.0)
Lymphocytes Relative: 25.4 % (ref 12.0–46.0)
Lymphs Abs: 1.1 K/uL (ref 0.7–4.0)
MCHC: 33.7 g/dL (ref 30.0–36.0)
MCV: 86.8 fl (ref 78.0–100.0)
Monocytes Absolute: 0.4 K/uL (ref 0.1–1.0)
Monocytes Relative: 8.6 % (ref 3.0–12.0)
Neutro Abs: 2.4 K/uL (ref 1.4–7.7)
Neutrophils Relative %: 58.5 % (ref 43.0–77.0)
Platelets: 241 K/uL (ref 150.0–400.0)
RBC: 4.76 Mil/uL (ref 3.87–5.11)
RDW: 13.3 % (ref 11.5–15.5)
WBC: 4.2 K/uL (ref 4.0–10.5)

## 2024-09-05 LAB — TSH: TSH: 2.2 u[IU]/mL (ref 0.35–5.50)

## 2024-09-05 MED ORDER — PANTOPRAZOLE SODIUM 40 MG PO TBEC: 40.0000 mg | DELAYED_RELEASE_TABLET | Freq: Every day | ORAL | 1 refills | Status: AC

## 2024-09-05 NOTE — Patient Instructions (Signed)
 Please read your instructions carefully.   GO TO THE LAB :  Get the blood work    Go to the front desk for the checkout Please make an appointment for a checkup in 3 to 4 months  For your acid reflux: Take pantoprazole  40 mg 1 tablet daily before breakfast.   Continue checking your blood pressure regularly Blood pressure goal:  between 110/65 and  130/80. 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 -Take pantoprazole  once daily before breakfast. -Stop taking over-the-counter Nexium. -Eat slowly, chew your food carefully, and drink sips of fluids during meals. -Follow up in 4 months to reassess your condition.

## 2024-09-05 NOTE — Progress Notes (Unsigned)
 "  Subjective:    Patient ID: Katherine Koch, female    DOB: 1946/01/16, 79 y.o.   MRN: [id number]  DOS:  09/05/2024 CPX  Discussed the use of AI scribe software for clinical note transcription with the patient, who gave verbal consent to proceed.  History of Present Illness Katherine Koch is a 79 year old female with a history of hiatal hernia and esophageal dysmotility who presents with worsening acid reflux.  Gastroesophageal symptoms - Intermittent acid reflux for several months, worsening in severity - Severe episode of acid reflux last night requiring Tums despite regular use of over-the-counter medication, likely Nexium - No current dysphagia or food impaction - No nausea, vomiting, or changes in stool color  Esophageal imaging findings - Esophageal imaging in September 2023 demonstrated hiatal hernia and esophageal dysmotility  Musculoskeletal symptoms - Localized left leg pain attributed to posture and arthritis - No chest pain, shortness of breath, or leg swelling  Vital signs and weight - Blood pressure typically below 130 mmHg systolic - Recent weight gain    Review of Systems See above   Past Medical History:  Diagnosis Date   Allergic rhinitis    Allergy    Arthritis    Dry eye syndrome    Heart murmur    Hyperlipemia    Hypertension    LVH (left ventricular hypertrophy) 05/14/2021   Osteopenia    per pt had a DEXA few years ago   Shingles 3/10    Past Surgical History:  Procedure Laterality Date   ABDOMINAL HYSTERECTOMY     B oophorectomy- remotely   BREAST EXCISIONAL BIOPSY Right     Current Outpatient Medications  Medication Instructions   amLODipine  (NORVASC ) 10 mg, Oral, Daily   azelastine  (ASTELIN ) 0.1 % nasal spray 2 sprays, Each Nare, 2 times daily, Use in each nostril as directed   Calcium  Carbonate-Vitamin D (CALTRATE 600+D PO) As directed   chlorthalidone  (HYGROTON ) 25 mg, Oral, Daily   ezetimibe  (ZETIA ) 10 mg, Oral, Daily    fluticasone  (FLONASE ) 50 MCG/ACT nasal spray 2 sprays, Each Nare, Daily   losartan  (COZAAR ) 50 mg, Oral, Daily   metoprolol  tartrate (LOPRESSOR ) 25 MG tablet TAKE 1/2 TABLET(12.5 MG) BY MOUTH DAILY   Multiple Vitamins-Minerals (MULTIVITAMINS) CHEW 1 tablet, Daily   potassium chloride  SA (KLOR-CON  M) 20 MEQ tablet 20 mEq, Oral, Daily   pravastatin  (PRAVACHOL ) 80 mg, Oral, Daily   spironolactone  (ALDACTONE ) 25 MG tablet TAKE 1/2 TABLET(12.5 MG) BY MOUTH TWICE DAILY       Objective:   Physical Exam BP 120/84   Pulse 67   Temp 97.8 F (36.6 C) (Oral)   Resp 16   Ht 5' 7.75 (1.721 m)   Wt 158 lb 2 oz (71.7 kg)   SpO2 97%   BMI 24.22 kg/m  General: Well developed, NAD, BMI noted Neck: No  thyromegaly.  Normal LFTs HEENT:  Normocephalic . Face symmetric, atraumatic Lungs:  CTA B Normal respiratory effort, no intercostal retractions, no accessory muscle use. Heart: RRR,  no murmur.  Abdomen:  Not distended, soft, non-tender. No rebound or rigidity.  Palpable nontender aorta Lower extremities: no pretibial edema bilaterally  Skin: Exposed areas without rash. Not pale. Not jaundice Neurologic:  alert & oriented X3.  Speech normal, gait appropriate for age and unassisted Strength symmetric and appropriate for age.  Psych: Cognition and judgment appear intact.  Cooperative with normal attention span and concentration.  Behavior appropriate. No anxious or depressed appearing.  Assessment    Problem list Hyperglycemia: A1C 6.0  (05-2016) HTN LVH /HFpEF: Per echo 2015, 2021 (LVH mild-moderate) Hyperlipidemia:  Declined to try atorvastatin before, 2018: s/e w/ crestor  , ok w/ pravastatin , zetia  added 2022 Osteopenia : 02-2010: T score -2.4;  Tscore -1.1 2015: Tscore -2.0; 01-2022 T-score -1.0 Allergic rhinitis Shingles ~2010 Dry eye syndrome  Palpable abdominal aorta: US  abdomen  12-2015: no AAA Gallbladder adenomatosis:  US  05-2022: No follow-up necessary Hepatic  asteatosis: Noted during GB US    Assessment & Plan Here for CPX -Td 2021 - Had a flu shot  -Vaccines I recommend: RSV, PNM 20, COVID booster, Shingrix. Pros>cons --No further paps, see previous entries.  -- MMG 03/27/2024 (per [facility]) --CCS: colonoscopy per patient aprox 2004,Cscope again 5-10; Cscope 6/ 2020: Report reviewed, no polyps, no further colonoscopy -- Recommend a healthy lifestyle --labs:   BMP CBC A1c TSH    HTN: On losartan , metoprolol , amlodipine , Aldactone , chlorthalidone , potassium supplements.  Ambulatory BPs 120 or less, no change, check a BMP High cholesterol on pravastatin  and Zetia .  Last LDL satisfactory Hyperglycemia: Check A1c LVH /HFpEF: LOV cardiology October 2025, cardiomegaly noted, echocardiogram November 2025 is stable.  A cardiac MRI was recommended GERD with hiatal hernia and esophageal dysmotility Saw GI and subsequently had a barium study September 2023 which demonstrated   a hiatal hernia and esophageal dysmotility.  Having frequent dyspepsia and acid reflux but no dysphagia or weight loss. Plan: - Prescribed pantoprazole  once daily before breakfast.  Stop OTC Nexium  Instructed to eat slowly, chew food carefully, and drink sips of fluids during meals. - Follow up in four months to reassess GERD and esophageal dysmotility.   RTC 4 months     "

## 2024-09-06 ENCOUNTER — Encounter: Payer: Self-pay | Admitting: Internal Medicine

## 2024-09-06 DIAGNOSIS — I5032 Chronic diastolic (congestive) heart failure: Secondary | ICD-10-CM | POA: Insufficient documentation

## 2024-09-06 NOTE — Assessment & Plan Note (Signed)
 Here for CPX  Other issues HTN: On losartan , metoprolol , amlodipine , Aldactone , chlorthalidone , potassium supplements.  Ambulatory BPs 120 or less, no change, check a BMP High cholesterol on pravastatin  and Zetia .  Last LDL satisfactory Hyperglycemia: Check A1c LVH /HFpEF: LOV cardiology October 2025, cardiomegaly noted, echocardiogram November 2025 is stable.  A cardiac MRI was recommended GERD with hiatal hernia and esophageal dysmotility Saw GI and subsequently had a barium study September 2023 which demonstrated   a hiatal hernia and esophageal dysmotility.  Having frequent dyspepsia and acid reflux but no dysphagia or weight loss. Plan: - Prescribed pantoprazole  once daily before breakfast.  Stop OTC Nexium  Instructed to eat slowly, chew food carefully, and drink sips of fluids during meals. - Follow up in four months to reassess GERD and esophageal dysmotility. RTC 4 months

## 2024-09-06 NOTE — Assessment & Plan Note (Signed)
 Here for CPX -Td 2021 - Had a flu shot  -Vaccines I recommend: RSV, PNM 20, COVID booster, Shingrix. Declines , pros>cons! --No further paps, see previous entries.  -- [facility] 03/27/2024 (per [facility]) --[facility]: colonoscopy per patient aprox 2004,Cscope again 5-10; Cscope 6/ 2020: Report reviewed, no polyps, no further colonoscopy -- Recommend a healthy lifestyle --labs:   BMP CBC A1c TSH

## 2024-09-07 ENCOUNTER — Ambulatory Visit: Payer: Self-pay | Admitting: Internal Medicine

## 2025-01-04 ENCOUNTER — Ambulatory Visit: Admitting: Internal Medicine
